# Patient Record
Sex: Male | Born: 1960
Health system: Southern US, Community
[De-identification: ages and names within clinical notes are randomized; demographics above are authoritative.]

## PROBLEM LIST (undated history)

## (undated) DIAGNOSIS — I998 Other disorder of circulatory system: Secondary | ICD-10-CM

## (undated) DIAGNOSIS — S149XXA Injury of unspecified nerves of neck, initial encounter: Secondary | ICD-10-CM

## (undated) DIAGNOSIS — G459 Transient cerebral ischemic attack, unspecified: Secondary | ICD-10-CM

## (undated) DIAGNOSIS — N529 Male erectile dysfunction, unspecified: Secondary | ICD-10-CM

## (undated) DIAGNOSIS — G589 Mononeuropathy, unspecified: Secondary | ICD-10-CM

## (undated) DIAGNOSIS — I10 Essential (primary) hypertension: Secondary | ICD-10-CM

## (undated) DIAGNOSIS — I428 Other cardiomyopathies: Secondary | ICD-10-CM

## (undated) DIAGNOSIS — R011 Cardiac murmur, unspecified: Secondary | ICD-10-CM

## (undated) DIAGNOSIS — I4821 Permanent atrial fibrillation: Secondary | ICD-10-CM

## (undated) DIAGNOSIS — G4733 Obstructive sleep apnea (adult) (pediatric): Secondary | ICD-10-CM

## (undated) DIAGNOSIS — N183 Chronic kidney disease, stage 3 (moderate): Secondary | ICD-10-CM

## (undated) DIAGNOSIS — Z9989 Dependence on other enabling machines and devices: Secondary | ICD-10-CM

## (undated) DIAGNOSIS — I119 Hypertensive heart disease without heart failure: Secondary | ICD-10-CM

## (undated) DIAGNOSIS — Z9889 Other specified postprocedural states: Secondary | ICD-10-CM

## (undated) DIAGNOSIS — M109 Gout, unspecified: Secondary | ICD-10-CM

## (undated) DIAGNOSIS — I7409 Other arterial embolism and thrombosis of abdominal aorta: Secondary | ICD-10-CM

## (undated) HISTORY — DX: Cardiac murmur, unspecified: R01.1

## (undated) HISTORY — PX: DOPPLER ECHOCARDIOGRAPHY: SHX263

## (undated) HISTORY — DX: Other cardiomyopathies: I42.8

## (undated) HISTORY — DX: Obstructive sleep apnea (adult) (pediatric): Z99.89

## (undated) HISTORY — DX: Other specified postprocedural states: Z98.890

## (undated) HISTORY — PX: KNEE SURGERY: SHX244

## (undated) HISTORY — DX: Obstructive sleep apnea (adult) (pediatric): G47.33

## (undated) HISTORY — DX: Gout, unspecified: M10.9

## (undated) HISTORY — DX: Essential (primary) hypertension: I10

## (undated) HISTORY — DX: Transient cerebral ischemic attack, unspecified: G45.9

## (undated) HISTORY — DX: Hypertensive heart disease without heart failure: I11.9

---

## 2000-03-03 ENCOUNTER — Encounter: Payer: Self-pay | Admitting: Emergency Medicine

## 2000-03-03 ENCOUNTER — Emergency Department (HOSPITAL_COMMUNITY): Admission: EM | Admit: 2000-03-03 | Discharge: 2000-03-03 | Payer: Self-pay | Admitting: Emergency Medicine

## 2001-01-20 ENCOUNTER — Emergency Department (HOSPITAL_COMMUNITY): Admission: EM | Admit: 2001-01-20 | Discharge: 2001-01-20 | Payer: Self-pay | Admitting: *Deleted

## 2001-04-04 ENCOUNTER — Emergency Department (HOSPITAL_COMMUNITY): Admission: EM | Admit: 2001-04-04 | Discharge: 2001-04-04 | Payer: Self-pay | Admitting: Emergency Medicine

## 2001-04-04 ENCOUNTER — Encounter: Payer: Self-pay | Admitting: Emergency Medicine

## 2001-04-08 ENCOUNTER — Emergency Department (HOSPITAL_COMMUNITY): Admission: EM | Admit: 2001-04-08 | Discharge: 2001-04-08 | Payer: Self-pay | Admitting: Emergency Medicine

## 2002-10-03 ENCOUNTER — Emergency Department (HOSPITAL_COMMUNITY): Admission: EM | Admit: 2002-10-03 | Discharge: 2002-10-03 | Payer: Self-pay | Admitting: Emergency Medicine

## 2003-12-28 DIAGNOSIS — G459 Transient cerebral ischemic attack, unspecified: Secondary | ICD-10-CM

## 2003-12-28 HISTORY — DX: Transient cerebral ischemic attack, unspecified: G45.9

## 2004-11-30 ENCOUNTER — Ambulatory Visit (HOSPITAL_COMMUNITY): Admission: RE | Admit: 2004-11-30 | Discharge: 2004-11-30 | Payer: Self-pay | Admitting: Neurosurgery

## 2004-12-03 ENCOUNTER — Ambulatory Visit (HOSPITAL_COMMUNITY): Admission: RE | Admit: 2004-12-03 | Discharge: 2004-12-03 | Payer: Self-pay | Admitting: *Deleted

## 2005-12-14 ENCOUNTER — Emergency Department (HOSPITAL_COMMUNITY): Admission: EM | Admit: 2005-12-14 | Discharge: 2005-12-14 | Payer: Self-pay | Admitting: Family Medicine

## 2008-04-15 ENCOUNTER — Emergency Department (HOSPITAL_COMMUNITY): Admission: EM | Admit: 2008-04-15 | Discharge: 2008-04-15 | Payer: Self-pay | Admitting: Emergency Medicine

## 2008-10-27 DIAGNOSIS — Z9889 Other specified postprocedural states: Secondary | ICD-10-CM

## 2008-10-27 HISTORY — DX: Other specified postprocedural states: Z98.890

## 2008-10-27 HISTORY — PX: OTHER SURGICAL HISTORY: SHX169

## 2008-10-27 HISTORY — PX: CARDIAC CATHETERIZATION: SHX172

## 2008-11-07 ENCOUNTER — Ambulatory Visit (HOSPITAL_COMMUNITY): Admission: RE | Admit: 2008-11-07 | Discharge: 2008-11-07 | Payer: Self-pay | Admitting: Cardiology

## 2008-12-05 ENCOUNTER — Encounter: Admission: RE | Admit: 2008-12-05 | Discharge: 2008-12-05 | Payer: Self-pay | Admitting: Family Medicine

## 2011-01-17 ENCOUNTER — Encounter: Payer: Self-pay | Admitting: Family Medicine

## 2011-05-11 NOTE — Cardiovascular Report (Signed)
[name], [name]:  [id number]   MEDICAL RECORD NO.:  [id number]          PATIENT TYPE:  OIB   LOCATION:  2899                         FACILITY:  [facility]   PHYSICIAN:  Eden Lathe. Einar Gip, MD       DATE OF BIRTH:  [birth year]   DATE OF PROCEDURE:  11/07/2008  DATE OF DISCHARGE:  11/07/2008                            CARDIAC CATHETERIZATION   PROCEDURES PERFORMED:  1. Left ventriculography.  2. Selective right and left coronary arteriography.  3. Ascending aortogram.  4. Abdominal aortogram and selective left arteriography.  5. Right heart catheterization and calculation of cardiac output and      cardiac index both by Fick and by thermodilution.   INDICATIONS:  Isaiah [name] is a 89-year gentleman who has known  atrial fibrillation, was initially seen in 2005, with the TIA, was  usually found to be in atrial fibrillation during a preop evaluation for  a cervical spinal surgery.  At that time, he was advised to be on  Coumadin; however, the patient has not been on Coumadin, was recently 2  weeks ago had an episode of TIA with right-sided weakness.  Because of  atrial fibrillation and TIA-like symptoms, he is referred for cardiac  evaluation.  Outpatient transthoracic echocardiogram had shown decreased  ejection fraction of 35-45% RV dilatation, presence of a ventricular  septal defect, and presence of possibility of bicuspid aortic valve.  Given LV systolic dysfunction and presence of VSD, he is now brought to  the catheterization lab to evaluate his coronary anatomy and also to  evaluate the significance of ventricular septal defect and to quantify  the shunting.  His right ventricle was also mild-to-moderately dilated  and mildly hypokinetic with a transthoracic echocardiogram.  Abdominal  aortogram and renal arteriography was performed to evaluate for renal  atherosclerosis and renal vascular hypertension given uncontrolled  hypertension.   HEMODYNAMIC  DATA:  Right heart catheterization.   RA pressure was 8/7 with mean 6 mmHg.  Saturation 71%.   RV pressure [id number], end-diastolic pressure 3 mmHg.  Saturation 67%.   PA pressure 33/17 with a mean of 22 mmHg.  Saturation 65%.   Pulmonary capillary wedge 9/11 with a mean of 8 mmHg.  Aortic saturation  was 94%.   Left ventricular saturation was 94%.   Cardiac output by Fick was 6.3 with a cardiac index of 2.76 and by  thermodilution, it was 5.34 with an index of 2.34, which was within  normal limits.   QT/QS was normal at 1.0.  This suggests no significant shunting.   LEFT HEART CATHETERIZATION HEMODYNAMIC DATA:  The left ventricular  pressure was [id number] with end-diastolic pressure of 8 mmHg.  Aortic  pressure was 139/100 with a mean of 118 mmHg.  There is no pressure  gradient across the aortic valve.   ANGIOGRAPHIC DATA:  Left ventricle:  Left ventricular systolic function  was preserved with an ejection fraction of 55%.  There is borderline  global hypokinesis.  There was no significant mitral regurgitation.   Ascending aortogram:  Ascending aortogram revealed presence of the  aortic valve  cusps.  The right coronary artery had an anterior origin.  There was no evidence of ascending aortic aneurysm or aortic  regurgitation.  No evidence of bicuspid aortic valve.   Right coronary artery:  Right coronary artery is anterior origin.  It is  a very large caliber vessel and gives origin to a very large PLA branch,  which supplies pretty much large part of the posterolateral wall.  The  proximal segment of his right coronary artery has a smooth 30-40%  stenosis, but is otherwise smooth and normal.   Left main coronary artery:  Left main coronary artery is unusually very  large and very long.  It trifurcates into circumflex, ramus  intermediate, and LAD.   Circumflex coronary artery:  The circumflex coronary artery is a  moderate-caliber vessel, which supplies small area.  It is  smooth and  normal.   Ramus intermediate:  Ramus intermediate is small-to-moderate caliber  vessel, which is smooth and normal.   LAD:  LAD is a moderate-caliber vessel giving origin to 3 diagonals and  several small diagonals.  LAD ends after wrapping around the apex.  The  mid-to-distal segment of the LAD has intramyocardial bridging, which is  very mild.  No significant disease was noted in the LAD.   Abdominal aorta.  Abdominal aorta is tortuous.  The renal arteries are  widely patent.   Selective left renal arteriography also confirmed widely patent renal  artery.  There was no evidence of renal artery stenosis.   IMPRESSION:  1. No significant coronary artery disease by cardiac catheterization.      Atrial fibrillation is probably secondary to hypertension with      hypertensive heart disease.  2. The ventricular septal defect appears to be very small probably      muscular in etiology.  It could not be seen by angiography and the      right heart saturation did not reveal any significant shunting      across the interventricular septum.  3. Mild pulmonary hypertension.   RECOMMENDATIONS:  For atrial fibrillation, the patient needs to be on  Coumadin given recent TIA.  Protime and INR will be checked this Monday  and Tuesday.  His serum creatinine needs be followed through.  A total  of 120 mL of contrast was utilized for diagnostic angio.   TECHNIQUE OF PROCEDURE:  Under usual sterile precautions, using a 7-  [place] right femoral venous and a 6-French right femoral venous access,  right and left heart catheterization was performed.  Using a balloon-tip  Swan-Ganz catheter, which was advanced through the venous sheath into  the pulmonary catheter wedge position without any complications.  The  right-sided hemodynamics were carefully analyzed, and cardiac outputs  were calculated both by thermodilution and by Fick.  Then, the catheter  was pulled out of the body.   Using  a multipurpose catheter, IVC, SVC saturations were also obtained.   Left heart catheterization was performed using multipurpose B2 catheter,  which was advanced over a J-wire.  Left ventriculography was performed  both in RAO and LAO projections.  Catheter was pulled into the ascending  aorta.  Left main coronary artery was selectively engaged, angiography  was performed, and ascending aortogram was performed in the LAO  projection, and abdominal aortogram and left selective left renal  arteriography was performed in the AP projection.  Catheter was pulled  out of the body, and a 6-French AL-1 catheter was utilized to engage the  aberrant  origin of the right coronary artery from the anterior wall of  the aorta, and angiography was performed.  Catheter was then pulled out  of the body in the usual fashion.  The patient tolerated the procedure  well.  No immediate complications were noted.      Eden Lathe. Einar Gip, MD  Electronically Signed     JRG/MEDQ  D:  11/07/2008  T:  11/08/2008  Job:  [id number]   cc:   Santiago Glad L. Darron Doom, M.D.

## 2011-09-29 LAB — POCT I-STAT 3, ART BLOOD GAS (G3+)
O2 Saturation: 93
O2 Saturation: 94
TCO2: 26
pCO2 arterial: 39.5
pH, Arterial: 7.407
pO2, Arterial: 72 — ABNORMAL LOW

## 2011-09-29 LAB — POCT I-STAT 3, VENOUS BLOOD GAS (G3P V)
Acid-Base Excess: 1
Acid-Base Excess: 2
Acid-base deficit: 4 — ABNORMAL HIGH
Bicarbonate: 26 — ABNORMAL HIGH
O2 Saturation: 57
O2 Saturation: 67
O2 Saturation: 71
O2 Saturation: 71
TCO2: 24
TCO2: 27
TCO2: 28
TCO2: 30
pCO2, Ven: 47.7
pH, Ven: 7.299
pH, Ven: 7.354 — ABNORMAL HIGH
pO2, Ven: 31

## 2011-09-29 LAB — BASIC METABOLIC PANEL
BUN: 21
CO2: 27
Calcium: 11.1 — ABNORMAL HIGH
Chloride: 105
Creatinine, Ser: 1.87 — ABNORMAL HIGH
GFR calc Af Amer: 47 — ABNORMAL LOW
GFR calc non Af Amer: 39 — ABNORMAL LOW
Glucose, Bld: 87
Potassium: 3.8
Sodium: 140

## 2012-03-02 ENCOUNTER — Other Ambulatory Visit: Payer: Self-pay | Admitting: Physician Assistant

## 2012-06-02 ENCOUNTER — Other Ambulatory Visit: Payer: Self-pay | Admitting: Physician Assistant

## 2012-09-15 ENCOUNTER — Other Ambulatory Visit: Payer: Self-pay | Admitting: Physician Assistant

## 2012-09-15 NOTE — Telephone Encounter (Signed)
Please pull paper chart.  

## 2012-09-18 NOTE — Telephone Encounter (Signed)
Chart pulled to PA pool at nurse station [phone]

## 2012-11-19 ENCOUNTER — Ambulatory Visit (INDEPENDENT_AMBULATORY_CARE_PROVIDER_SITE_OTHER): Payer: BC Managed Care – PPO | Admitting: Family Medicine

## 2012-11-19 VITALS — BP 180/126 | HR 85 | Temp 98.3°F | Resp 20 | Ht 73.5 in | Wt 241.0 lb

## 2012-11-19 DIAGNOSIS — M62830 Muscle spasm of back: Secondary | ICD-10-CM

## 2012-11-19 DIAGNOSIS — I10 Essential (primary) hypertension: Secondary | ICD-10-CM

## 2012-11-19 DIAGNOSIS — M5380 Other specified dorsopathies, site unspecified: Secondary | ICD-10-CM

## 2012-11-19 MED ORDER — CARVEDILOL 6.25 MG PO TABS: 6.2500 mg | ORAL_TABLET | Freq: Two times a day (BID) | ORAL | Status: DC

## 2012-11-19 MED ORDER — PREDNISONE 20 MG PO TABS: ORAL_TABLET | ORAL | Status: DC

## 2012-11-19 MED ORDER — CLONIDINE HCL 0.1 MG PO TABS
0.2000 mg | ORAL_TABLET | Freq: Once | ORAL | Status: AC
  Administered 2012-11-19: 0.2 mg via ORAL

## 2012-11-19 MED ORDER — LOSARTAN POTASSIUM-HCTZ 100-25 MG PO TABS: 1.0000 | ORAL_TABLET | Freq: Every day | ORAL | Status: DC

## 2012-11-19 MED ORDER — DIAZEPAM 5 MG PO TABS: 5.0000 mg | ORAL_TABLET | Freq: Two times a day (BID) | ORAL | Status: DC | PRN

## 2012-11-19 NOTE — Patient Instructions (Signed)
Make sure you are applying heat to the area for at least 15 minutes 4 times a day followed by gentle stretching of your neck and shoulders.  After this, I recommend massage down the left side of your spine with a tennis ball.  Pinched Nerve The term pinched nerve describes one type of damage or injury to a nerve or set of nerves. Pinched nerves can sometimes lead to other conditions. These include peripheral neuropathy, carpal tunnel syndrome, and tennis elbow. The extent of such injuries may vary from minor, temporary damage to a more permanent condition. Early diagnosis is important to prevent further damage or complications. Pinched nerve is a common cause of on-the-job injury. CAUSES  The injury may result from:  Compression.  Constriction.  Stretching. SYMPTOMS  Symptoms include:  Numbness.  "Pins and needles" or burning sensations.  Pain radiating outward from the injured area.  One of the most common examples of a single compressed nerve is the feeling of having a foot or hand "fall asleep." TREATMENT  The most often recommended treatment for pinched nerve is rest for the affected area. Corticosteroids help alleviate pain. In some cases, surgery is recommended. Physical therapy may be recommended. Splints or collars may be used. With treatment, most people recover from pinched nerve. In some cases, the damage is irreversible. Document Released: 12/03/2002 Document Revised: 03/06/2012 Document Reviewed: 11/20/2008 [facility] Patient Information 2013 [place].  Your blood pressure if VERY high and it is important that we get it treated as soon as possible.  Please pick up your losartan-hctz - this should only be $10/month - if it is more expensive, consider switching to a different pharmacy.  Instead of the bystolic, we will switch you to carvedilol which should be only $4/month.  Come back to clinic in 1 wk for recheck on your pinched nerve and your blood pressure and at that  time, we can decide if you want to switch from the carvedilol to the bystolic.

## 2012-11-19 NOTE — Progress Notes (Signed)
Subjective:    Patient ID: Isaiah [name], male    DOB: 1961-01-16, 51 y.o.   MRN: [id number] Chief Complaint  Patient presents with  . Neck Pain    middle of back neck for 2 days  . Shoulder Pain    right side 2 days    HPI  Yesterday pt developed severe left upper back pain along left spine and inside of left shoulder blade, feels much better if he leans his neck to the right.  Has never had a problem like the prior but thinks it must be a pinched nerve because he talked with other guys at his gym who have had similar sxs and looked it up on-line.  He had some aleve and a very old muscle relaxant at home (does not know what it was) that he tried yesterday w/o any relief.  Massage feels very good.    He has a long standing h/o HTN but has trouble affording his BP medication - was going to be about $140 so has been off of it for about 1-2 mos - he last checked his BP at the pharmacy about a month ago and it was 190s/100s.  He currently does not have a PCP - was being seen by Dr. Moshe Cipro who moved away but gave him a yr worth of this bp med before he left.  States he has been on sev other bp meds in the past but these seem to be the one that work the best.  Past Medical History  Diagnosis Date  . Hypertension   . Gout    Current Outpatient Prescriptions on File Prior to Visit  Medication Sig Dispense Refill  . bystolic 10 MG tablet Take 1 tablet (10 mg total) by mouth once daily.  60 tablet  0  . indomethacin (INDOCIN) 50 MG capsule take 1 capsule by mouth three times a day if needed for gout  30 capsule  0  . losartan-hydrochlorothiazide (HYZAAR) 100-25 MG per tablet Take 1 tablet by mouth daily.  30 tablet  0   No current facility-administered medications on file prior to visit.   Review of Systems  HENT: Positive for neck pain and neck stiffness.   Musculoskeletal: Positive for back pain, joint swelling and arthralgias. Negative for myalgias and gait problem.  Neurological: Negative  for weakness and numbness.  Hematological: Negative for adenopathy.      BP 183/141  Pulse 85  Temp 98.3 F (36.8 C) (Oral)  Resp 20  Ht 6' 1.5" (1.867 m)  Wt 241 lb (109.317 kg)  BMI 31.37 kg/m2  SpO2 100% Objective:   Physical Exam  Constitutional: He is oriented to person, place, and time. He appears well-developed and well-nourished. No distress.  HENT:  Head: Normocephalic and atraumatic.  Eyes: Conjunctivae are normal. Pupils are equal, round, and reactive to light. No scleral icterus.  Neck: Neck supple. Muscular tenderness present. No spinous process tenderness present. Decreased range of motion present. No erythema present. No thyromegaly present.  Cardiovascular: Normal rate, regular rhythm, normal heart sounds and intact distal pulses.   Pulmonary/Chest: Effort normal and breath sounds normal. No respiratory distress.  Musculoskeletal: He exhibits tenderness. He exhibits no edema.       Right shoulder: He exhibits pain and spasm. He exhibits normal range of motion, no bony tenderness, no crepitus, no deformity and normal strength.       Cervical back: He exhibits decreased range of motion, tenderness and spasm. He exhibits no bony tenderness.  Thoracic back: He exhibits normal range of motion, no bony tenderness, no swelling and no deformity.       Lumbar back: He exhibits normal range of motion, no bony tenderness, no edema and no deformity.  Lymphadenopathy:    He has no cervical adenopathy.  Neurological: He is alert and oriented to person, place, and time. He has normal strength and normal reflexes. He displays no atrophy. No sensory deficit. He exhibits normal muscle tone. Coordination and gait normal.  Skin: Skin is warm and dry. No rash noted. He is not diaphoretic. No erythema.  Psychiatric: He has a normal mood and affect. His behavior is normal.          Assessment & Plan:   1. Hypertension  cloNIDine (CATAPRES) tablet 0.2 mg,  losartan-hydrochlorothiazide (HYZAAR) 100-25 MG per tablet, carvedilol (COREG) 6.25 MG tablet  2. Muscle spasm of back - heat, massage, stretching.   predniSONE (DELTASONE) 20 MG tablet, diazepam (VALIUM) 5 MG tablet  RTC 1 wk  for bp recheck - decide whether he wants to switch back to expensive bystolic or increase carvedilol (may cause prob w/ erectile dysfunction).  Also, the hctz may induce gout flares so consider stopping - consider adding in ccb - amlodipine?   Will also recheck back - consider trigger point injection vs PT if still having pain.

## 2012-11-23 ENCOUNTER — Other Ambulatory Visit: Payer: Self-pay | Admitting: Physician Assistant

## 2012-11-27 ENCOUNTER — Ambulatory Visit (INDEPENDENT_AMBULATORY_CARE_PROVIDER_SITE_OTHER): Payer: BC Managed Care – PPO | Admitting: Emergency Medicine

## 2012-11-27 ENCOUNTER — Other Ambulatory Visit: Payer: Self-pay | Admitting: *Deleted

## 2012-11-27 ENCOUNTER — Ambulatory Visit: Payer: BC Managed Care – PPO

## 2012-11-27 VITALS — BP 133/110 | HR 83 | Temp 97.6°F | Resp 18 | Wt 238.0 lb

## 2012-11-27 DIAGNOSIS — R52 Pain, unspecified: Secondary | ICD-10-CM

## 2012-11-27 DIAGNOSIS — IMO0002 Reserved for concepts with insufficient information to code with codable children: Secondary | ICD-10-CM

## 2012-11-27 DIAGNOSIS — M25519 Pain in unspecified shoulder: Secondary | ICD-10-CM

## 2012-11-27 DIAGNOSIS — M5410 Radiculopathy, site unspecified: Secondary | ICD-10-CM

## 2012-11-27 DIAGNOSIS — M5090 Cervical disc disorder, unspecified, unspecified cervical region: Secondary | ICD-10-CM

## 2012-11-27 MED ORDER — GABAPENTIN 300 MG PO CAPS: ORAL_CAPSULE | ORAL | Status: DC

## 2012-11-27 MED ORDER — PREDNISONE 20 MG PO TABS: ORAL_TABLET | ORAL | Status: DC

## 2012-11-27 NOTE — Progress Notes (Signed)
  Subjective:    Patient ID: Isaiah [name], male    DOB: 12-16-1961, 51 y.o.   MRN: [id number]  HPI Pain in left shoulder feels like a "pinched nerve" x 3 weeks; more painful when leans neck to left. Seen in our office 11/19/12.  Given predinsone and valium. Numbness in left index finger, pain extends to neck and left shoulder blade.  Mixes dyes at work, very physical; works out at gym  Review of Systems     Objective:   Physical Exam there is tenderness along the left side of the neck along the left medial scapula. There is an absent deep tendon reflex in the left biceps and left brachioradialis. Triceps reflexes 2+. There is significant pain with extension of the neck [facility] reading (PRIMARY) by  Dr.Zaakirah Kistner C5-C6 degenerative disc disease is noted . C7 not seen on these        Assessment & Plan:  Patient has signs and symptoms of a radiculopathy involving C5 and C6. We'll check baseline films and decide on treatment plan after that. Patient does not tolerate pain medications. We'll put on a taper dose of prednisone along with Neurontin and have him see Dr. Jimmye Norman for evaluation.

## 2012-12-06 ENCOUNTER — Encounter (HOSPITAL_COMMUNITY): Payer: Self-pay

## 2012-12-06 ENCOUNTER — Emergency Department (HOSPITAL_COMMUNITY)
Admission: EM | Admit: 2012-12-06 | Discharge: 2012-12-06 | Disposition: A | Discharge status: Home / Self Care | Payer: BC Managed Care – PPO | Attending: Emergency Medicine | Admitting: Emergency Medicine

## 2012-12-06 DIAGNOSIS — M62830 Muscle spasm of back: Secondary | ICD-10-CM

## 2012-12-06 DIAGNOSIS — M549 Dorsalgia, unspecified: Secondary | ICD-10-CM | POA: Insufficient documentation

## 2012-12-06 DIAGNOSIS — Z7901 Long term (current) use of anticoagulants: Secondary | ICD-10-CM | POA: Insufficient documentation

## 2012-12-06 DIAGNOSIS — I10 Essential (primary) hypertension: Secondary | ICD-10-CM | POA: Insufficient documentation

## 2012-12-06 DIAGNOSIS — M109 Gout, unspecified: Secondary | ICD-10-CM | POA: Insufficient documentation

## 2012-12-06 DIAGNOSIS — M5380 Other specified dorsopathies, site unspecified: Secondary | ICD-10-CM | POA: Insufficient documentation

## 2012-12-06 DIAGNOSIS — Z79899 Other long term (current) drug therapy: Secondary | ICD-10-CM | POA: Insufficient documentation

## 2012-12-06 MED ORDER — METHOCARBAMOL 500 MG PO TABS
1000.0000 mg | ORAL_TABLET | Freq: Once | ORAL | Status: AC
  Administered 2012-12-06: 1000 mg via ORAL
  Filled 2012-12-06: qty 2

## 2012-12-06 MED ORDER — LOSARTAN POTASSIUM 50 MG PO TABS
100.0000 mg | ORAL_TABLET | Freq: Once | ORAL | Status: AC
  Administered 2012-12-06: 100 mg via ORAL
  Filled 2012-12-06: qty 2

## 2012-12-06 MED ORDER — KETOROLAC TROMETHAMINE 60 MG/2ML IM SOLN
60.0000 mg | Freq: Once | INTRAMUSCULAR | Status: AC
  Administered 2012-12-06: 60 mg via INTRAMUSCULAR
  Filled 2012-12-06: qty 2

## 2012-12-06 MED ORDER — NAPROXEN 500 MG PO TABS: 500.0000 mg | ORAL_TABLET | Freq: Two times a day (BID) | ORAL | Status: DC

## 2012-12-06 MED ORDER — METHOCARBAMOL 750 MG PO TABS: 750.0000 mg | ORAL_TABLET | Freq: Two times a day (BID) | ORAL | Status: DC

## 2012-12-06 NOTE — ED Notes (Signed)
Pt. States "I have a pinched nerve. It starts in my neck and goes down my left arm". Pt. States he went to his PCP, given prednisone and muscle relaxer. Also been seeing a chiropractor x1week.

## 2012-12-06 NOTE — ED Provider Notes (Addendum)
History     CSN: [id number]  Arrival date & time 12/06/12  [id number]   First MD Initiated Contact with Patient 12/06/12 [phone]      Chief Complaint  Patient presents with  . Numbness    (Consider location/radiation/quality/duration/timing/severity/associated sxs/prior treatment) Patient is a 51 y.o. male presenting with back pain. The history is provided by the patient.  Back Pain  This is a chronic problem. The current episode started more than 1 week ago. The problem occurs constantly. The problem has not changed since onset.The pain is associated with no known injury. Pain location: neck and along trapezius muscles on the left. The quality of the pain is described as stabbing and shooting. Radiates to: left arm tingling. The pain is severe. The symptoms are aggravated by certain positions. The pain is the same all the time. Associated symptoms include tingling. Pertinent negatives include no chest pain, no fever, no numbness, no weight loss, no headaches, no abdominal pain, no abdominal swelling, no bowel incontinence, no perianal numbness, no bladder incontinence, no dysuria, no pelvic pain, no leg pain, no paresthesias, no paresis and no weakness. He has tried analgesics and muscle relaxants for the symptoms. The treatment provided mild relief. Risk factors: has been seeing a chiropractor and manipulation made this worse.  PERC negative and [place] 0 no CP, no DOE no SOB works out.    Past Medical History  Diagnosis Date  . Hypertension   . Gout     Past Surgical History  Procedure Date  . Knee surgery     Family History  Problem Relation Age of Onset  . Hypertension Mother   . Hypertension Father     History  Substance Use Topics  . Smoking status: Never Smoker   . Smokeless tobacco: Not on file  . Alcohol Use: No      Review of Systems  Constitutional: Negative for fever and weight loss.  HENT: Negative for neck stiffness.   Respiratory: Negative for chest tightness,  shortness of breath and wheezing.   Cardiovascular: Negative for chest pain, palpitations and leg swelling.  Gastrointestinal: Negative for abdominal pain and bowel incontinence.  Genitourinary: Negative for bladder incontinence, dysuria and pelvic pain.  Musculoskeletal: Positive for back pain.  Neurological: Positive for tingling. Negative for weakness, numbness, headaches and paresthesias.  All other systems reviewed and are negative.    Allergies  Codeine  Home Medications   Current Outpatient Rx  Name  Route  Sig  Dispense  Refill  . CARVEDILOL 6.25 MG PO TABS   Oral   Take 1 tablet (6.25 mg total) by mouth 2 (two) times daily with a meal.   60 tablet   0   . DIAZEPAM 5 MG PO TABS   Oral   Take 1 tablet (5 mg total) by mouth every 12 (twelve) hours as needed (muscle spasm).   20 tablet   0   . GABAPENTIN 300 MG PO CAPS      Neurontin 300 mg one in the morning and one at bedtime   60 capsule   3   . INDOMETHACIN 50 MG PO CAPS      take 1 capsule by mouth three times a day if needed for gout   30 capsule   0   . LOSARTAN POTASSIUM-HCTZ 100-25 MG PO TABS   Oral   Take 1 tablet by mouth daily.   30 tablet   0   . PREDNISONE 20 MG PO TABS  Take 3 a day for 3 days 2 a day for 3 days one a day for 3 days   18 tablet   0     BP 182/131  Pulse 85  Resp 18  SpO2 100%  Physical Exam  Constitutional: He is oriented to person, place, and time. He appears well-developed and well-nourished. No distress.  HENT:  Head: Normocephalic and atraumatic.  Mouth/Throat: Oropharynx is clear and moist.  Eyes: Conjunctivae normal are normal. Pupils are equal, round, and reactive to light.  Neck: Normal range of motion. Neck supple. No JVD present. No tracheal deviation present.  Cardiovascular: Normal rate, regular rhythm and intact distal pulses.   Pulmonary/Chest: Effort normal and breath sounds normal. He has no wheezes. He has no rales.  Abdominal: Soft. Bowel  sounds are normal. There is no tenderness. There is no rebound and no guarding.  Neurological: He is alert and oriented to person, place, and time. He has normal reflexes. He displays normal reflexes. He exhibits normal muscle tone.       5/5 strength in the LUE intact sensation intact reflexes palpable spasm in the left trapezius muscles  Skin: Skin is warm and dry.  Psychiatric: He has a normal mood and affect.    ED Course  Procedures (including critical care time)  Labs Reviewed - No data to display No results found.   No diagnosis found.    MDM  Recommend follow up with PMD for rehab medicine referral.  Will change muscle relaxant.  Patient still has steroids will add another pain med.     Patient not taking his BP meds will give a dose here and told he must restart same and must follow up.  Return for fevers chills weakness or numbness     Shacoria Latif K Shafter Jupin-Rasch, MD 12/06/12 0601  Brittney Mucha K Arslan Kier-Rasch, MD 12/06/12 [phone]

## 2012-12-06 NOTE — ED Notes (Signed)
Pt. BP high due to pt non-compliance with medicine.

## 2012-12-18 DIAGNOSIS — Z0271 Encounter for disability determination: Secondary | ICD-10-CM

## 2013-01-17 ENCOUNTER — Other Ambulatory Visit: Payer: Self-pay | Admitting: Physician Assistant

## 2013-01-23 ENCOUNTER — Encounter (HOSPITAL_COMMUNITY): Payer: Self-pay | Admitting: *Deleted

## 2013-01-23 ENCOUNTER — Emergency Department (HOSPITAL_COMMUNITY): Payer: BC Managed Care – PPO | Admitting: Anesthesiology

## 2013-01-23 ENCOUNTER — Encounter (HOSPITAL_COMMUNITY): Payer: Self-pay | Admitting: Anesthesiology

## 2013-01-23 ENCOUNTER — Inpatient Hospital Stay (HOSPITAL_COMMUNITY)
Admission: EM | Admit: 2013-01-23 | Discharge: 2013-01-26 | DRG: 478 | Disposition: A | Discharge status: Home / Self Care | Payer: BC Managed Care – PPO | Attending: Surgery | Admitting: Surgery

## 2013-01-23 ENCOUNTER — Encounter (HOSPITAL_COMMUNITY)
Admission: EM | Disposition: A | Discharge status: Home / Self Care | Payer: Self-pay | Source: Home / Self Care | Attending: Surgery

## 2013-01-23 DIAGNOSIS — I4821 Permanent atrial fibrillation: Secondary | ICD-10-CM | POA: Diagnosis present

## 2013-01-23 DIAGNOSIS — M109 Gout, unspecified: Secondary | ICD-10-CM | POA: Diagnosis present

## 2013-01-23 DIAGNOSIS — I998 Other disorder of circulatory system: Secondary | ICD-10-CM

## 2013-01-23 DIAGNOSIS — I129 Hypertensive chronic kidney disease with stage 1 through stage 4 chronic kidney disease, or unspecified chronic kidney disease: Secondary | ICD-10-CM | POA: Diagnosis present

## 2013-01-23 DIAGNOSIS — I7409 Other arterial embolism and thrombosis of abdominal aorta: Principal | ICD-10-CM

## 2013-01-23 DIAGNOSIS — I10 Essential (primary) hypertension: Secondary | ICD-10-CM

## 2013-01-23 DIAGNOSIS — N183 Chronic kidney disease, stage 3 unspecified: Secondary | ICD-10-CM | POA: Diagnosis present

## 2013-01-23 DIAGNOSIS — M79609 Pain in unspecified limb: Secondary | ICD-10-CM

## 2013-01-23 DIAGNOSIS — Z7982 Long term (current) use of aspirin: Secondary | ICD-10-CM

## 2013-01-23 DIAGNOSIS — Z7901 Long term (current) use of anticoagulants: Secondary | ICD-10-CM

## 2013-01-23 DIAGNOSIS — N529 Male erectile dysfunction, unspecified: Secondary | ICD-10-CM | POA: Diagnosis present

## 2013-01-23 DIAGNOSIS — I743 Embolism and thrombosis of arteries of the lower extremities: Secondary | ICD-10-CM

## 2013-01-23 DIAGNOSIS — R404 Transient alteration of awareness: Secondary | ICD-10-CM | POA: Diagnosis not present

## 2013-01-23 DIAGNOSIS — Z79899 Other long term (current) drug therapy: Secondary | ICD-10-CM

## 2013-01-23 DIAGNOSIS — I2510 Atherosclerotic heart disease of native coronary artery without angina pectoris: Secondary | ICD-10-CM | POA: Diagnosis present

## 2013-01-23 DIAGNOSIS — I4891 Unspecified atrial fibrillation: Secondary | ICD-10-CM | POA: Diagnosis present

## 2013-01-23 DIAGNOSIS — Z8673 Personal history of transient ischemic attack (TIA), and cerebral infarction without residual deficits: Secondary | ICD-10-CM

## 2013-01-23 HISTORY — DX: Mononeuropathy, unspecified: G58.9

## 2013-01-23 HISTORY — DX: Permanent atrial fibrillation: I48.21

## 2013-01-23 HISTORY — PX: EMBOLECTOMY: SHX44

## 2013-01-23 HISTORY — DX: Injury of unspecified nerves of neck, initial encounter: S14.9XXA

## 2013-01-23 HISTORY — DX: Other disorder of circulatory system: I99.8

## 2013-01-23 HISTORY — DX: Chronic kidney disease, stage 3 (moderate): N18.3

## 2013-01-23 HISTORY — DX: Other arterial embolism and thrombosis of abdominal aorta: I74.09

## 2013-01-23 HISTORY — DX: Male erectile dysfunction, unspecified: N52.9

## 2013-01-23 LAB — CBC WITH DIFFERENTIAL/PLATELET
Basophils Absolute: 0 10*3/uL (ref 0.0–0.1)
Eosinophils Absolute: 0 10*3/uL (ref 0.0–0.7)
Eosinophils Relative: 0 % (ref 0–5)
Lymphocytes Relative: 15 % (ref 12–46)
Lymphs Abs: 1.9 10*3/uL (ref 0.7–4.0)
MCV: 97.3 fL (ref 78.0–100.0)
Neutrophils Relative %: 80 % — ABNORMAL HIGH (ref 43–77)
Platelets: 132 10*3/uL — ABNORMAL LOW (ref 150–400)
RBC: 4.87 MIL/uL (ref 4.22–5.81)
RDW: 12.8 % (ref 11.5–15.5)
WBC: 12.9 10*3/uL — ABNORMAL HIGH (ref 4.0–10.5)

## 2013-01-23 LAB — PREPARE RBC (CROSSMATCH)

## 2013-01-23 LAB — POCT I-STAT, CHEM 8
BUN: 27 mg/dL — ABNORMAL HIGH (ref 6–23)
Calcium, Ion: 1.38 mmol/L — ABNORMAL HIGH (ref 1.12–1.23)
Hemoglobin: 17.3 g/dL — ABNORMAL HIGH (ref 13.0–17.0)
Sodium: 137 mEq/L (ref 135–145)
TCO2: 29 mmol/L (ref 0–100)

## 2013-01-23 LAB — BASIC METABOLIC PANEL
BUN: 27 mg/dL — ABNORMAL HIGH (ref 6–23)
Calcium: 11.4 mg/dL — ABNORMAL HIGH (ref 8.4–10.5)
Creatinine, Ser: 2.21 mg/dL — ABNORMAL HIGH (ref 0.50–1.35)
GFR calc Af Amer: 38 mL/min — ABNORMAL LOW (ref >90)
GFR calc non Af Amer: 33 mL/min — ABNORMAL LOW (ref >90)

## 2013-01-23 SURGERY — ABDOMINAL AORTAGRAM
Anesthesia: General | Site: Leg Lower | Wound class: Clean

## 2013-01-23 MED ORDER — CEFAZOLIN SODIUM-DEXTROSE 2-3 GM-% IV SOLR
INTRAVENOUS | Status: DC | PRN
  Administered 2013-01-23: 2 g via INTRAVENOUS

## 2013-01-23 MED ORDER — SUCCINYLCHOLINE CHLORIDE 20 MG/ML IJ SOLN
INTRAMUSCULAR | Status: DC | PRN
  Administered 2013-01-23: 100 mg via INTRAVENOUS

## 2013-01-23 MED ORDER — HEPARIN SODIUM (PORCINE) 1000 UNIT/ML IJ SOLN
INTRAMUSCULAR | Status: DC | PRN
  Administered 2013-01-23: 4000 [IU] via INTRAVENOUS
  Administered 2013-01-23: 3000 [IU] via INTRAVENOUS

## 2013-01-23 MED ORDER — HEPARIN (PORCINE) IN NACL 100-0.45 UNIT/ML-% IJ SOLN
INTRAMUSCULAR | Status: DC | PRN
  Administered 2013-01-23: 16 [IU]/h via INTRAVENOUS

## 2013-01-23 MED ORDER — MIDAZOLAM HCL 5 MG/5ML IJ SOLN
INTRAMUSCULAR | Status: DC | PRN
  Administered 2013-01-23: 2 mg via INTRAVENOUS

## 2013-01-23 MED ORDER — 0.9 % SODIUM CHLORIDE (POUR BTL) OPTIME
TOPICAL | Status: DC | PRN
  Administered 2013-01-23: 1000 mL

## 2013-01-23 MED ORDER — SODIUM CHLORIDE 0.9 % IV BOLUS (SEPSIS)
1000.0000 mL | Freq: Once | INTRAVENOUS | Status: AC
  Administered 2013-01-23: 1000 mL via INTRAVENOUS

## 2013-01-23 MED ORDER — LACTATED RINGERS IV SOLN
INTRAVENOUS | Status: DC | PRN
  Administered 2013-01-23: 21:00:00 via INTRAVENOUS

## 2013-01-23 MED ORDER — LACTATED RINGERS IV SOLN
INTRAVENOUS | Status: DC | PRN
  Administered 2013-01-23 (×3): via INTRAVENOUS

## 2013-01-23 MED ORDER — SODIUM CHLORIDE 0.9 % IV SOLN
10.0000 mg | INTRAVENOUS | Status: DC | PRN
  Administered 2013-01-23: 25 ug/min via INTRAVENOUS

## 2013-01-23 MED ORDER — SODIUM CHLORIDE 0.9 % IJ SOLN
INTRAVENOUS | Status: DC | PRN
  Administered 2013-01-23 – 2013-01-24 (×2): via INTRAMUSCULAR

## 2013-01-23 MED ORDER — HYDROMORPHONE HCL PF 1 MG/ML IJ SOLN
1.0000 mg | Freq: Once | INTRAMUSCULAR | Status: AC
  Administered 2013-01-23: 1 mg via INTRAVENOUS
  Filled 2013-01-23: qty 1

## 2013-01-23 MED ORDER — FENTANYL CITRATE 0.05 MG/ML IJ SOLN
INTRAMUSCULAR | Status: DC | PRN
  Administered 2013-01-23: 50 ug via INTRAVENOUS
  Administered 2013-01-23 (×3): 100 ug via INTRAVENOUS
  Administered 2013-01-23: 50 ug via INTRAVENOUS

## 2013-01-23 MED ORDER — ROCURONIUM BROMIDE 100 MG/10ML IV SOLN
INTRAVENOUS | Status: DC | PRN
  Administered 2013-01-23: 50 mg via INTRAVENOUS
  Administered 2013-01-23: 20 mg via INTRAVENOUS

## 2013-01-23 MED ORDER — PROPOFOL 10 MG/ML IV BOLUS
INTRAVENOUS | Status: DC | PRN
  Administered 2013-01-23: 200 mg via INTRAVENOUS

## 2013-01-23 MED ORDER — SODIUM CHLORIDE 0.9 % IR SOLN
Status: DC | PRN
  Administered 2013-01-23: 23:00:00

## 2013-01-23 MED ORDER — LIDOCAINE HCL (CARDIAC) 20 MG/ML IV SOLN
INTRAVENOUS | Status: DC | PRN
  Administered 2013-01-23: 70 mg via INTRAVENOUS

## 2013-01-23 MED ORDER — HEPARIN (PORCINE) IN NACL 100-0.45 UNIT/ML-% IJ SOLN
1600.0000 [IU]/h | INTRAMUSCULAR | Status: DC
  Administered 2013-01-23: 1600 [IU]/h via INTRAVENOUS
  Filled 2013-01-23 (×2): qty 250

## 2013-01-23 MED ORDER — DEXTROSE 5 % IV SOLN
1.5000 g | INTRAVENOUS | Status: DC
  Filled 2013-01-23: qty 1.5

## 2013-01-23 MED ORDER — HEPARIN BOLUS VIA INFUSION
5000.0000 [IU] | Freq: Once | INTRAVENOUS | Status: AC
  Administered 2013-01-23: 5000 [IU] via INTRAVENOUS

## 2013-01-23 SURGICAL SUPPLY — 71 items
BAG BANDED W/RUBBER/TAPE 36X54 (MISCELLANEOUS) ×12 IMPLANT
BAG SNAP BAND KOVER 36X36 (MISCELLANEOUS) ×4 IMPLANT
CANISTER SUCTION 2500CC (MISCELLANEOUS) ×4 IMPLANT
CATH EMB 3FR 80CM (CATHETERS) ×8 IMPLANT
CATH EMB 4FR 80CM (CATHETERS) ×4 IMPLANT
CATH EMB 5FR 80CM (CATHETERS) ×4 IMPLANT
CATH OMNI FLUSH .035X70CM (CATHETERS) ×4 IMPLANT
CLIP TI MEDIUM 24 (CLIP) ×8 IMPLANT
CLIP TI WIDE RED SMALL 24 (CLIP) ×8 IMPLANT
CLOTH BEACON ORANGE TIMEOUT ST (SAFETY) ×4 IMPLANT
COVER MAYO STAND STRL (DRAPES) ×4 IMPLANT
COVER SURGICAL LIGHT HANDLE (MISCELLANEOUS) ×4 IMPLANT
DERMABOND ADHESIVE PROPEN (GAUZE/BANDAGES/DRESSINGS) ×1
DERMABOND ADVANCED (GAUZE/BANDAGES/DRESSINGS) ×2
DERMABOND ADVANCED .7 DNX12 (GAUZE/BANDAGES/DRESSINGS) ×6 IMPLANT
DERMABOND ADVANCED .7 DNX6 (GAUZE/BANDAGES/DRESSINGS) ×3 IMPLANT
DRAPE WARM FLUID 44X44 (DRAPE) ×4 IMPLANT
ELECT BLADE 4.0 EZ CLEAN MEGAD (MISCELLANEOUS) ×4
ELECT BLADE 6.5 EXT (BLADE) IMPLANT
ELECT CAUTERY BLADE 6.4 (BLADE) ×4 IMPLANT
ELECT REM PT RETURN 9FT ADLT (ELECTROSURGICAL) ×4
ELECTRODE BLDE 4.0 EZ CLN MEGD (MISCELLANEOUS) ×3 IMPLANT
ELECTRODE REM PT RTRN 9FT ADLT (ELECTROSURGICAL) ×3 IMPLANT
GLOVE BIO SURGEON STRL SZ7 (GLOVE) ×20 IMPLANT
GLOVE BIOGEL PI IND STRL 7.0 (GLOVE) ×9 IMPLANT
GLOVE BIOGEL PI IND STRL 7.5 (GLOVE) ×9 IMPLANT
GLOVE BIOGEL PI INDICATOR 7.0 (GLOVE) ×3
GLOVE BIOGEL PI INDICATOR 7.5 (GLOVE) ×3
GLOVE SURG SS PI 7.5 STRL IVOR (GLOVE) ×4 IMPLANT
GOWN PREVENTION PLUS XLARGE (GOWN DISPOSABLE) ×12 IMPLANT
GOWN PREVENTION PLUS XXLARGE (GOWN DISPOSABLE) ×4 IMPLANT
GOWN STRL NON-REIN LRG LVL3 (GOWN DISPOSABLE) ×16 IMPLANT
GOWN STRL REIN 2XL LVL4 (GOWN DISPOSABLE) IMPLANT
HEMOSTAT SNOW SURGICEL 2X4 (HEMOSTASIS) ×8 IMPLANT
HEMOSTAT SURGICEL 2X14 (HEMOSTASIS) IMPLANT
INSERT FOGARTY 61MM (MISCELLANEOUS) ×4 IMPLANT
INSERT FOGARTY SM (MISCELLANEOUS) ×8 IMPLANT
KIT BASIN OR (CUSTOM PROCEDURE TRAY) ×4 IMPLANT
KIT ROOM TURNOVER OR (KITS) ×4 IMPLANT
LOOP VESSEL MINI RED (MISCELLANEOUS) ×4 IMPLANT
MARKER GRAFT CORONARY BYPASS (MISCELLANEOUS) IMPLANT
NEEDLE PERC 18GX7CM (NEEDLE) ×4 IMPLANT
NS IRRIG 1000ML POUR BTL (IV SOLUTION) ×8 IMPLANT
PACK AORTA (CUSTOM PROCEDURE TRAY) ×4 IMPLANT
PAD ARMBOARD 7.5X6 YLW CONV (MISCELLANEOUS) ×8 IMPLANT
PENCIL BUTTON HOLSTER BLD 10FT (ELECTRODE) ×4 IMPLANT
SHEATH AVANTI 11CM 5FR (MISCELLANEOUS) ×4 IMPLANT
SPECIMEN JAR MEDIUM (MISCELLANEOUS) ×4 IMPLANT
SUT ETHIBOND 5 LR DA (SUTURE) IMPLANT
SUT PDS AB 1 TP1 54 (SUTURE) ×8 IMPLANT
SUT PROLENE 3 0 SH 48 (SUTURE) ×4 IMPLANT
SUT PROLENE 5 0 C 1 24 (SUTURE) ×16 IMPLANT
SUT PROLENE 5 0 C 1 36 (SUTURE) ×4 IMPLANT
SUT SILK 2 0 TIES 17X18 (SUTURE) ×1
SUT SILK 2 0SH CR/8 30 (SUTURE) IMPLANT
SUT SILK 2-0 18XBRD TIE BLK (SUTURE) ×3 IMPLANT
SUT SILK 3 0 TIES 17X18 (SUTURE) ×1
SUT SILK 3-0 18XBRD TIE BLK (SUTURE) ×3 IMPLANT
SUT VIC AB 2-0 CT1 27 (SUTURE) ×2
SUT VIC AB 2-0 CT1 TAPERPNT 27 (SUTURE) ×6 IMPLANT
SUT VIC AB 3-0 SH 27 (SUTURE) ×2
SUT VIC AB 3-0 SH 27X BRD (SUTURE) ×6 IMPLANT
SUT VICRYL 4-0 PS2 18IN ABS (SUTURE) ×8 IMPLANT
SYR 3ML LL SCALE MARK (SYRINGE) ×4 IMPLANT
SYR MEDRAD MARK V 150ML (SYRINGE) ×4 IMPLANT
TOWEL BLUE STERILE X RAY DET (MISCELLANEOUS) ×8 IMPLANT
TOWEL OR 17X24 6PK STRL BLUE (TOWEL DISPOSABLE) ×8 IMPLANT
TOWEL OR 17X26 10 PK STRL BLUE (TOWEL DISPOSABLE) ×8 IMPLANT
TRAY FOLEY CATH 14FRSI W/METER (CATHETERS) ×4 IMPLANT
WATER STERILE IRR 1000ML POUR (IV SOLUTION) ×8 IMPLANT
WIRE BENTSON .035X145CM (WIRE) ×4 IMPLANT

## 2013-01-23 NOTE — ED Notes (Signed)
Br Brabham in to see patient

## 2013-01-23 NOTE — ED Provider Notes (Signed)
History     CSN: [id number]  Arrival date & time 01/23/13  1839   First MD Initiated Contact with Patient 01/23/13 1842      Chief Complaint  Patient presents with  . Leg Pain    (Consider location/radiation/quality/duration/timing/severity/associated sxs/prior treatment) Patient is a 52 y.o. male presenting with leg pain. The history is provided by the patient.  Leg Pain  The incident occurred 1 to 2 hours ago. The incident occurred at home. There was no injury mechanism. The pain is present in the left leg and right leg. The quality of the pain is described as throbbing and aching. The pain is at a severity of 10/10. The pain is severe. The pain has been constant since onset. Associated symptoms include numbness. He reports no foreign bodies present. The symptoms are aggravated by activity, bearing weight and palpation. He has tried nothing for the symptoms.    Past Medical History  Diagnosis Date  . Hypertension   . Gout   . Pinched nerve in neck     Past Surgical History  Procedure Date  . Knee surgery     Family History  Problem Relation Age of Onset  . Hypertension Mother   . Hypertension Father     History  Substance Use Topics  . Smoking status: Never Smoker   . Smokeless tobacco: Not on file  . Alcohol Use: No      Review of Systems  Constitutional: Negative for fever and fatigue.  HENT: Negative for congestion, rhinorrhea and postnasal drip.   Eyes: Negative for photophobia and visual disturbance.  Respiratory: Negative for chest tightness, shortness of breath and wheezing.   Cardiovascular: Negative for chest pain, palpitations and leg swelling.  Gastrointestinal: Negative for nausea, vomiting, abdominal pain and diarrhea.  Genitourinary: Negative for urgency, frequency and difficulty urinating.  Musculoskeletal: Negative for back pain and arthralgias.       Lower extremity leg pain  Skin: Negative for rash and wound.  Neurological: Positive for  weakness and numbness. Negative for headaches.  Psychiatric/Behavioral: Negative for confusion and agitation.    Allergies  Codeine  Home Medications   Current Outpatient Rx  Name  Route  Sig  Dispense  Refill  . ASPIRIN BUFFERED 325 MG PO TABS   Oral   Take 325 mg by mouth 2 (two) times daily as needed. For pain         . ASPIRIN 81 MG PO TABS   Oral   Take 81 mg by mouth daily.         Marland Kitchen LOSARTAN POTASSIUM-HCTZ 100-25 MG PO TABS   Oral   Take 1 tablet by mouth daily.   30 tablet   0   . ADULT MULTIVITAMIN W/MINERALS CH   Oral   Take 1 tablet by mouth daily.         . NEBIVOLOL HCL 5 MG PO TABS   Oral   Take 5 mg by mouth daily.           BP 149/101  Pulse 93  Temp 97.4 F (36.3 C) (Oral)  Resp 20  Ht 6' 1.62" (1.87 m)  Wt 238 lb 1.6 oz (108 kg)  BMI 30.88 kg/m2  SpO2 96%  Physical Exam  Constitutional: He is oriented to person, place, and time. He appears well-developed and well-nourished. No distress.  HENT:  Head: Normocephalic and atraumatic.  Mouth/Throat: Oropharynx is clear and moist.  Eyes: EOM are normal. Pupils are equal, round, and reactive  to light.  Neck: Normal range of motion. Neck supple.  Cardiovascular: Normal rate and normal heart sounds.  An irregularly irregular rhythm present.       Unable to palpate DP, PT, or popliteal pulses bilaterally. Has faint bilateral femoral pulses.  Pulmonary/Chest: Effort normal and breath sounds normal. He has no wheezes. He has no rales.  Abdominal: Soft. Bowel sounds are normal. He exhibits no distension. There is no tenderness. There is no rebound and no guarding.  Musculoskeletal: Normal range of motion. He exhibits no edema and no tenderness.  Lymphadenopathy:    He has no cervical adenopathy.  Neurological: He is alert and oriented to person, place, and time. He displays normal reflexes. No cranial nerve deficit. He exhibits normal muscle tone. Coordination normal.       0/5 muscle strength  right lower extremity from knee down. 4/5 muscle strength left lower extremity left foot. Has no pinprick sensation to right lower extremity from knee down. Has minimal sensation to left lower extremity below knee.  Skin: Skin is warm and dry. No rash noted.  Psychiatric: He has a normal mood and affect. His behavior is normal.    ED Course  Procedures (including critical care time)   Date: 01/23/2013  Rate: 88  Rhythm: atrial fibrillation  QRS Axis: left  Intervals: normal  ST/T Wave abnormalities: nonspecific ST changes  Conduction Disutrbances:none  Narrative Interpretation:   Old EKG Reviewed: none available    Labs Reviewed  CBC WITH DIFFERENTIAL - Abnormal; Notable for the following:    WBC 12.9 (*)     Platelets 132 (*)     Neutrophils Relative 80 (*)     Neutro Abs 10.3 (*)     All other components within normal limits  BASIC METABOLIC PANEL - Abnormal; Notable for the following:    Potassium 3.3 (*)     Glucose, Bld 181 (*)     BUN 27 (*)     Creatinine, Ser 2.21 (*)     Calcium 11.4 (*)     GFR calc non Af Amer 33 (*)     GFR calc Af Amer 38 (*)     All other components within normal limits  POCT I-STAT, CHEM 8 - Abnormal; Notable for the following:    Potassium 3.2 (*)     BUN 27 (*)     Creatinine, Ser 2.40 (*)     Glucose, Bld 181 (*)     Calcium, Ion 1.38 (*)     Hemoglobin 17.3 (*)     All other components within normal limits  HEPARIN LEVEL (UNFRACTIONATED)  HEPARIN LEVEL (UNFRACTIONATED)  CBC  PREPARE RBC (CROSSMATCH)  TYPE AND SCREEN   No results found.   1. Terminal aortic occlusion       MDM  80M here with lower extremity pain and weakness that started acutely about 2-3 hours ago. Has remote history of a. Fib but only on aspirin. Denies chest pain, abdominal pain, or dyspnea. EKG reveals afib. Unable to palpate DP, PT, or popliateal pulses. Unable to fund pulse with Doppler as well. No sensation and no muscle strength to right lower  extremity from knee down. Right foot cool to palpation. Left foot with limited muscle strength and decreased sensation. Concern for ischemic lower extremities secondary to emboli. Started on heparin immediately and vascular surgery consulted. CTA chest/abd/pelvis/lower extremities ordered but creatinine found to be 2. Vascular took to OR emergently without imaging.  Little Ishikawa, MD 01/23/13 2100

## 2013-01-23 NOTE — ED Notes (Addendum)
Pt began exp bil LE pain and numbness 1 hour after having therapy for pinched cervical nerve.  Pt has been going through therapy x 1 month and states nothing is different.  Only change is that he had a cardiac workout this am.  Pt diaphoretic.  Able to move L foot, but not R.  Denies back pain.

## 2013-01-23 NOTE — Anesthesia Preprocedure Evaluation (Addendum)
Anesthesia Evaluation  Patient identified by MRN, date of birth, ID band Patient awake    Reviewed: Allergy & Precautions, H&P , NPO status , Patient's Chart, lab work & pertinent test results  Airway Mallampati: II      Dental   Pulmonary neg pulmonary ROS,  breath sounds clear to auscultation        Cardiovascular hypertension, Rate:Normal  Cardiac history noted.History of irrregular heart beat as per wife.   Neuro/Psych TIA   GI/Hepatic Neg liver ROS, Last meal approximately 4pm. Risks of emergency surgery discussed with patient and wife.   Endo/Other  negative endocrine ROS  Renal/GU negative Renal ROS     Musculoskeletal   Abdominal   Peds  Hematology   Anesthesia Other Findings   Reproductive/Obstetrics                         Anesthesia Physical Anesthesia Plan  ASA: III  Anesthesia Plan: General   Post-op Pain Management:    Induction: Intravenous  Airway Management Planned: Oral ETT  Additional Equipment:   Intra-op Plan:   Post-operative Plan: Possible Post-op intubation/ventilation  Informed Consent:   Dental advisory given  Plan Discussed with: CRNA and Anesthesiologist  Anesthesia Plan Comments:         Anesthesia Quick Evaluation

## 2013-01-23 NOTE — Anesthesia Procedure Notes (Signed)
Procedure Name: Intubation Date/Time: 01/23/2013 9:55 PM Performed by: Valetta Fuller Pre-anesthesia Checklist: Patient identified, Emergency Drugs available, Suction available and Patient being monitored Patient Re-evaluated:Patient Re-evaluated prior to inductionOxygen Delivery Method: Circle system utilized Preoxygenation: Pre-oxygenation with 100% oxygen Intubation Type: IV induction, Rapid sequence and Cricoid Pressure applied Laryngoscope Size: Miller and 2 Grade View: Grade I Tube type: Oral Tube size: 7.5 mm Number of attempts: 1 Airway Equipment and Method: Stylet Placement Confirmation: ETT inserted through vocal cords under direct vision,  positive ETCO2 and breath sounds checked- equal and bilateral Secured at: 23 cm Tube secured with: Tape Dental Injury: Teeth and Oropharynx as per pre-operative assessment

## 2013-01-23 NOTE — Consult Note (Signed)
[facility]      Consult Note  Patient name: Isaiah [name] MRN: [id number] DOB: October 15, 1961 Sex: male  Consulting Physician:  Emergency department  Reason for Consult:  Chief Complaint  Patient presents with  . Leg Pain    HISTORY OF PRESENT ILLNESS: This is a 52 year old gentleman who was in his normal state of affairs earlier today. Approximately 3 hours ago he began having numbness and weakness in his legs. He also complains of pain. There were no aggravating or relieving factors. He has gotten to the point where he can hardly move either leg and his sensation is decreased. The patient has a history of an irregular heartbeat since birth. He has been on Coumadin in the past. He has been taken off of his Coumadin and placed on aspirin which he admits to not taking regularly. His other medical problem includes hypertension. He is a nonsmoker  Past Medical History  Diagnosis Date  . Hypertension   . Gout   . Pinched nerve in neck     Past Surgical History  Procedure Date  . Knee surgery     History   Social History  . Marital Status: Divorced    Spouse Name: N/A    Number of Children: N/A  . Years of Education: N/A   Occupational History  . Not on file.   Social History Main Topics  . Smoking status: Never Smoker   . Smokeless tobacco: Not on file  . Alcohol Use: No  . Drug Use: No  . Sexually Active: Yes     Comment: married   Other Topics Concern  . Not on file   Social History Narrative  . No narrative on file    Family History  Problem Relation Age of Onset  . Hypertension Mother   . Hypertension Father     Allergies as of 01/23/2013 - Review Complete 01/23/2013  Allergen Reaction Noted  . Codeine Itching 12/06/2012    No current facility-administered medications on file prior to encounter.   Current Outpatient Prescriptions on File Prior to Encounter  Medication Sig Dispense Refill  . losartan-hydrochlorothiazide  (HYZAAR) 100-25 MG per tablet Take 1 tablet by mouth daily.  30 tablet  0  . nebivolol (BYSTOLIC) 5 MG tablet Take 5 mg by mouth daily.         REVIEW OF SYSTEMS: Cardiovascular: No chest pain, chest pressure. Positive for irregular heartbeat  Pulmonary: No productive cough, asthma or wheezing. Neurologic: No weakness, paresthesias, aphasia, or amaurosis. No dizziness. Hematologic: No bleeding problems or clotting disorders. Musculoskeletal: No joint pain or joint swelling. Gastrointestinal: No blood in stool or hematemesis Genitourinary: No dysuria or hematuria. Psychiatric:: No history of major depression. Integumentary: No rashes or ulcers. Constitutional: No fever or chills.  PHYSICAL EXAMINATION: General: The patient appears their stated age.  Vital signs are BP 149/101  Pulse 93  Temp 97.4 F (36.3 C) (Oral)  Resp 20  Ht 6' 1.62" (1.87 m)  Wt 238 lb 1.6 oz (108 kg)  BMI 30.88 kg/m2  SpO2 96% Pulmonary: Respirations are non-labored HEENT:  No gross abnormalities Abdomen: Soft and non-tender  Musculoskeletal: There are no major deformities.   Neurologic: Bilateral lower extremities are cool to the touch from the midcalf down. He has decreased motor function on the right foot as well as decreased sensation. These are more pronounced on the right than the left but definitely present on both extremities. Skin: There are no ulcer or  rashes noted. Psychiatric: The patient has normal affect. Cardiovascular: Irregular rate. I cannot palpate or Doppler femoral or pedal pulses bilaterally  Diagnostic Studies: None  Medication Changes: The patient had heparin ordered  Assessment:   bilateral ischemic lower extremities Plan: Based on clinical examination, the patient has bilateral lower extremity ischemia. Presumably, this is from aortoiliac occlusion. The most likely explanation is from his atrial fibrillation. I have discussed with the patient that this is a limb threatening  problem. He needs to go emergently to the operating room for an attempt at surgical revascularization. I discussed making bilateral femoral incisions and hopefully proceeding with simple thrombectomy. I also discussed the possibility of performing a fasciotomy either at the time of his embolectomy or later if the develops a compartment syndrome. He understands that this is a limb threatening situation. We will proceed emergently.     Eldridge Abrahams, M.D. [facility] Office: [phone] Pager:  [phone]

## 2013-01-23 NOTE — ED Provider Notes (Signed)
I saw and evaluated the patient, reviewed the resident's note and I agree with the findings and plan.  Acute onset of bilateral lower extremity pain, numbness. No trauma. No abdominal pain or back pain. Bilateral lower extremities are cool from feet to mid calf. Unable to palpate DP or PT pulses. Unable to palpate femoral pulses bilaterally. Concern for aortic occlusion. Discussed with Dr. Trula Slade of vascular surgery who will take patient to the OR. Creatinine elevated at 2.5 precludes CT angiogram.  CRITICAL CARE Performed by: Ezequiel Essex   Total critical care time: 30  Critical care time was exclusive of separately billable procedures and treating other patients.  Critical care was necessary to treat or prevent imminent or life-threatening deterioration.  Critical care was time spent personally by me on the following activities: development of treatment plan with patient and/or surrogate as well as nursing, discussions with consultants, evaluation of patient's response to treatment, examination of patient, obtaining history from patient or surrogate, ordering and performing treatments and interventions, ordering and review of laboratory studies, ordering and review of radiographic studies, pulse oximetry and re-evaluation of patient's condition.   Ezequiel Essex, MD 01/23/13 2207

## 2013-01-23 NOTE — ED Notes (Signed)
Unable to locate pulses in either lower ext.

## 2013-01-23 NOTE — Progress Notes (Signed)
ANTICOAGULATION CONSULT NOTE - Initial Consult  Pharmacy Consult for [facility] Indication: lower extremity ischemia  Allergies  Allergen Reactions  . Codeine Itching    Patient Measurements: Height: 6' 1.62" (187 cm) Weight: 238 lb 1.6 oz (108 kg) IBW/kg (Calculated) : 81.33  Heparin Dosing Weight: 103kg  Vital Signs: Temp: 97.4 F (36.3 C) (01/28 1851) Temp src: Oral (01/28 1851) BP: 149/101 mmHg (01/28 1851) Pulse Rate: 93  (01/28 1851)  Labs:  Basename 01/23/13 1943 01/23/13 1924  HGB 17.3* 16.1  HCT 51.0 47.4  PLT -- 132*  APTT -- --  LABPROT -- --  INR -- --  HEPARINUNFRC -- --  CREATININE 2.40* --  CKTOTAL -- --  CKMB -- --  TROPONINI -- --    Estimated Creatinine Clearance: 47.9 ml/min (by C-G formula based on Cr of 2.4).   Medical History: Past Medical History  Diagnosis Date  . Hypertension   . Gout   . Pinched nerve in neck     Medications:   (Not in a hospital admission)  Assessment: 52 y/o male patient admitted with b/l LE pain, numbess, and lack of pulses requiring anticoagulation for r/o ischemia.  Goal of Therapy:  Heparin level 0.3-0.7 units/ml Monitor platelets by anticoagulation protocol: Yes   Plan:  Heparin 5000 unit IV bolus followed by infusion at 1600 units/hr. Check 6 hour heparin level with daily cbc and heparin level.  Davonna Belling, PharmD, BCPS Pager [phone] 01/23/2013,7:55 PM

## 2013-01-23 NOTE — ED Notes (Signed)
BP left arm 146/102  Right arm 146/97

## 2013-01-24 ENCOUNTER — Encounter (HOSPITAL_COMMUNITY): Payer: Self-pay | Admitting: Cardiology

## 2013-01-24 DIAGNOSIS — I4891 Unspecified atrial fibrillation: Secondary | ICD-10-CM

## 2013-01-24 DIAGNOSIS — N183 Chronic kidney disease, stage 3 unspecified: Secondary | ICD-10-CM

## 2013-01-24 DIAGNOSIS — I998 Other disorder of circulatory system: Secondary | ICD-10-CM

## 2013-01-24 DIAGNOSIS — I10 Essential (primary) hypertension: Secondary | ICD-10-CM | POA: Diagnosis present

## 2013-01-24 DIAGNOSIS — I7409 Other arterial embolism and thrombosis of abdominal aorta: Secondary | ICD-10-CM

## 2013-01-24 DIAGNOSIS — N529 Male erectile dysfunction, unspecified: Secondary | ICD-10-CM | POA: Insufficient documentation

## 2013-01-24 DIAGNOSIS — I2510 Atherosclerotic heart disease of native coronary artery without angina pectoris: Secondary | ICD-10-CM | POA: Diagnosis present

## 2013-01-24 DIAGNOSIS — I4821 Permanent atrial fibrillation: Secondary | ICD-10-CM | POA: Diagnosis present

## 2013-01-24 HISTORY — DX: Other disorder of circulatory system: I99.8

## 2013-01-24 HISTORY — DX: Chronic kidney disease, stage 3 unspecified: N18.30

## 2013-01-24 HISTORY — DX: Permanent atrial fibrillation: I48.21

## 2013-01-24 HISTORY — DX: Other arterial embolism and thrombosis of abdominal aorta: I74.09

## 2013-01-24 LAB — BASIC METABOLIC PANEL
Calcium: 10 mg/dL (ref 8.4–10.5)
Chloride: 102 mEq/L (ref 96–112)
Chloride: 103 mEq/L (ref 96–112)
Creatinine, Ser: 1.62 mg/dL — ABNORMAL HIGH (ref 0.50–1.35)
GFR calc Af Amer: 52 mL/min — ABNORMAL LOW (ref >90)
GFR calc Af Amer: 56 mL/min — ABNORMAL LOW (ref >90)
GFR calc non Af Amer: 45 mL/min — ABNORMAL LOW (ref >90)
GFR calc non Af Amer: 48 mL/min — ABNORMAL LOW (ref >90)
Glucose, Bld: 150 mg/dL — ABNORMAL HIGH (ref 70–99)
Potassium: 3.7 mEq/L (ref 3.5–5.1)
Sodium: 136 mEq/L (ref 135–145)

## 2013-01-24 LAB — CBC
MCH: 31.5 pg (ref 26.0–34.0)
MCHC: 33.1 g/dL (ref 30.0–36.0)
MCV: 95.2 fL (ref 78.0–100.0)
Platelets: 123 10*3/uL — ABNORMAL LOW (ref 150–400)
RDW: 12.8 % (ref 11.5–15.5)
WBC: 8.2 10*3/uL (ref 4.0–10.5)

## 2013-01-24 LAB — HEPARIN LEVEL (UNFRACTIONATED)
Heparin Unfractionated: >2 IU/mL — ABNORMAL HIGH (ref 0.30–0.70)
Heparin Unfractionated: 0.81 IU/mL — ABNORMAL HIGH (ref 0.30–0.70)

## 2013-01-24 LAB — MRSA PCR SCREENING: MRSA by PCR: NEGATIVE

## 2013-01-24 MED ORDER — HEPARIN (PORCINE) IN NACL 100-0.45 UNIT/ML-% IJ SOLN
1250.0000 [IU]/h | INTRAMUSCULAR | Status: DC
  Administered 2013-01-24: 800 [IU]/h via INTRAVENOUS
  Administered 2013-01-24 (×2): 1000 [IU]/h via INTRAVENOUS
  Administered 2013-01-25: 1250 [IU]/h via INTRAVENOUS
  Filled 2013-01-24 (×5): qty 250

## 2013-01-24 MED ORDER — LABETALOL HCL 5 MG/ML IV SOLN
10.0000 mg | INTRAVENOUS | Status: DC | PRN
  Filled 2013-01-24: qty 4

## 2013-01-24 MED ORDER — OXYCODONE HCL 5 MG PO TABS: 5.0000 mg | ORAL_TABLET | ORAL | Status: DC | PRN

## 2013-01-24 MED ORDER — MAGNESIUM SULFATE 40 MG/ML IJ SOLN
2.0000 g | Freq: Once | INTRAMUSCULAR | Status: AC | PRN
  Filled 2013-01-24: qty 50

## 2013-01-24 MED ORDER — SODIUM CHLORIDE 0.9 % IV SOLN
INTRAVENOUS | Status: DC
  Administered 2013-01-24 – 2013-01-25 (×3): via INTRAVENOUS

## 2013-01-24 MED ORDER — POTASSIUM CHLORIDE CRYS ER 20 MEQ PO TBCR: 20.0000 meq | EXTENDED_RELEASE_TABLET | Freq: Once | ORAL | Status: AC | PRN

## 2013-01-24 MED ORDER — SENNOSIDES-DOCUSATE SODIUM 8.6-50 MG PO TABS
1.0000 | ORAL_TABLET | Freq: Every evening | ORAL | Status: DC | PRN
  Filled 2013-01-24: qty 1

## 2013-01-24 MED ORDER — ACETAMINOPHEN 650 MG RE SUPP: 325.0000 mg | RECTAL | Status: DC | PRN

## 2013-01-24 MED ORDER — LOSARTAN POTASSIUM 50 MG PO TABS
100.0000 mg | ORAL_TABLET | Freq: Every day | ORAL | Status: DC
  Administered 2013-01-24 – 2013-01-26 (×3): 100 mg via ORAL
  Filled 2013-01-24 (×3): qty 2

## 2013-01-24 MED ORDER — PANTOPRAZOLE SODIUM 40 MG PO TBEC
40.0000 mg | DELAYED_RELEASE_TABLET | Freq: Every day | ORAL | Status: DC
  Administered 2013-01-24 – 2013-01-25 (×2): 40 mg via ORAL
  Filled 2013-01-24 (×3): qty 1

## 2013-01-24 MED ORDER — HYDROCHLOROTHIAZIDE 25 MG PO TABS
25.0000 mg | ORAL_TABLET | Freq: Every day | ORAL | Status: DC
  Administered 2013-01-24 – 2013-01-26 (×3): 25 mg via ORAL
  Filled 2013-01-24 (×3): qty 1

## 2013-01-24 MED ORDER — MORPHINE SULFATE 2 MG/ML IJ SOLN: 2.0000 mg | INTRAMUSCULAR | Status: DC | PRN

## 2013-01-24 MED ORDER — HEMOSTATIC AGENTS (NO CHARGE) OPTIME
TOPICAL | Status: DC | PRN
  Administered 2013-01-24 (×2): 1 via TOPICAL

## 2013-01-24 MED ORDER — GUAIFENESIN-DM 100-10 MG/5ML PO SYRP: 15.0000 mL | ORAL_SOLUTION | ORAL | Status: DC | PRN

## 2013-01-24 MED ORDER — LOSARTAN POTASSIUM-HCTZ 100-25 MG PO TABS: 1.0000 | ORAL_TABLET | Freq: Every day | ORAL | Status: DC

## 2013-01-24 MED ORDER — NEOSTIGMINE METHYLSULFATE 1 MG/ML IJ SOLN
INTRAMUSCULAR | Status: DC | PRN
  Administered 2013-01-24: 5 mg via INTRAVENOUS

## 2013-01-24 MED ORDER — ASPIRIN BUFFERED 325 MG PO TABS: 325.0000 mg | ORAL_TABLET | Freq: Every day | ORAL | Status: DC

## 2013-01-24 MED ORDER — DEXTROSE 5 % IV SOLN
1.5000 g | Freq: Two times a day (BID) | INTRAVENOUS | Status: AC
  Administered 2013-01-24 (×2): 1.5 g via INTRAVENOUS
  Filled 2013-01-24 (×2): qty 1.5

## 2013-01-24 MED ORDER — NEBIVOLOL HCL 5 MG PO TABS
5.0000 mg | ORAL_TABLET | Freq: Every day | ORAL | Status: DC
  Administered 2013-01-24: 5 mg via ORAL
  Filled 2013-01-24 (×2): qty 1

## 2013-01-24 MED ORDER — HYDRALAZINE HCL 20 MG/ML IJ SOLN
10.0000 mg | INTRAMUSCULAR | Status: DC | PRN
  Filled 2013-01-24: qty 0.5

## 2013-01-24 MED ORDER — METOPROLOL TARTRATE 1 MG/ML IV SOLN: 2.0000 mg | INTRAVENOUS | Status: DC | PRN

## 2013-01-24 MED ORDER — ONDANSETRON HCL 4 MG/2ML IJ SOLN
INTRAMUSCULAR | Status: DC | PRN
  Administered 2013-01-24: 4 mg via INTRAVENOUS

## 2013-01-24 MED ORDER — BISACODYL 5 MG PO TBEC: 5.0000 mg | DELAYED_RELEASE_TABLET | Freq: Every day | ORAL | Status: DC | PRN

## 2013-01-24 MED ORDER — PHENOL 1.4 % MT LIQD: 1.0000 | OROMUCOSAL | Status: DC | PRN

## 2013-01-24 MED ORDER — ONDANSETRON HCL 4 MG/2ML IJ SOLN: 4.0000 mg | Freq: Four times a day (QID) | INTRAMUSCULAR | Status: DC | PRN

## 2013-01-24 MED ORDER — DIPHENHYDRAMINE HCL 25 MG PO CAPS: 25.0000 mg | ORAL_CAPSULE | ORAL | Status: DC | PRN

## 2013-01-24 MED ORDER — DOPAMINE-DEXTROSE 3.2-5 MG/ML-% IV SOLN: 3.0000 ug/kg/min | INTRAVENOUS | Status: DC

## 2013-01-24 MED ORDER — DOCUSATE SODIUM 100 MG PO CAPS
100.0000 mg | ORAL_CAPSULE | Freq: Every day | ORAL | Status: DC
  Administered 2013-01-25 – 2013-01-26 (×2): 100 mg via ORAL
  Filled 2013-01-24 (×2): qty 1

## 2013-01-24 MED ORDER — FENTANYL CITRATE 0.05 MG/ML IJ SOLN: 25.0000 ug | INTRAMUSCULAR | Status: DC | PRN

## 2013-01-24 MED ORDER — ASPIRIN EC 325 MG PO TBEC
325.0000 mg | DELAYED_RELEASE_TABLET | Freq: Every day | ORAL | Status: DC
  Administered 2013-01-24 – 2013-01-26 (×3): 325 mg via ORAL
  Filled 2013-01-24 (×3): qty 1

## 2013-01-24 MED ORDER — SODIUM CHLORIDE 0.9 % IV SOLN: 500.0000 mL | Freq: Once | INTRAVENOUS | Status: AC | PRN

## 2013-01-24 MED ORDER — GLYCOPYRROLATE 0.2 MG/ML IJ SOLN
INTRAMUSCULAR | Status: DC | PRN
  Administered 2013-01-24: .8 mg via INTRAVENOUS

## 2013-01-24 MED ORDER — ACETAMINOPHEN 325 MG PO TABS: 325.0000 mg | ORAL_TABLET | ORAL | Status: DC | PRN

## 2013-01-24 NOTE — Consult Note (Addendum)
Reason for Consult: chronic/permanent A Fib needing antiocoagulation  Referring Physician: Dr. Trula Slade  Cardiologist:  Dr. Carles Collet Isaiah [name] is an 52 y.o. male.    Chief Complaint:  Bil. Leg pain and admitted 01/23/13 with Terminal aortic occlusion.  HPI: This is a 52 year old gentleman who was in his normal state of affairs until 01/23/13 when at approximately 3 hours prior to admit he began having numbness and weakness in his legs. He also complained of pain. There were no aggravating or relieving factors. He had gotten to the point where he could hardly move either leg and his sensation is decreased. The patient has a history of an irregular heartbeat since birth. He has been on Coumadin in the past. He has been taken off of his Coumadin and placed on aspirin which he admits to not taking regularly. His other medical problem includes hypertension. He is a nonsmoker.  In August of 07/2012 he saw Dr. Ellyn Hack and they discussed importance of anticoagulation with chronic A. Fib and hx of TIA.  Pt refused coumadin and did not wish to take meds that were twice a day dosing so pt was started on Xarelto.  Pt tells me today he thought the xarelto was for short term use.  He still has some at home.     Prior cardiac history includes Rt. And Lt.  cardiac cath 2009 with with cardiac output by Fick of 6.3 and thremal dilution of 5.34.  Non obstructive CAD though he did have some intramyocardial bridging of the LAD.  Normal carotid dopplers in 2009.    Past Medical History  Diagnosis Date  . Hypertension   . Gout   . Pinched nerve in neck   . Extremity ischemia, critical bil lower ext. 01/24/2013  . Terminal aortic occlusion 01/24/2013  . Atrial fibrillation, permanent 01/24/2013  . HTN (hypertension) 01/24/2013  . Erectile dysfunction 01/24/2013  . CKD (chronic kidney disease) stage 3, GFR 30-59 ml/min 01/24/2013  . CAD (coronary artery disease), non obstructive by cardiac cath 2009 01/24/2013     Past Surgical History  Procedure Date  . Knee surgery   . Cardiac catheterization     Family History  Problem Relation Age of Onset  . Hypertension Mother   . Hypertension Father    Social History:  reports that he has never smoked. He does not have any smokeless tobacco history on file. He reports that he does not drink alcohol or use illicit drugs. Married, 5 children, 4 grandchildren.    Allergies:  Allergies  Allergen Reactions  . Codeine Itching    Medications Prior to Admission  Medication Sig Dispense Refill  . aspirin 325 MG buffered tablet Take 325 mg by mouth 2 (two) times daily as needed. For pain      . aspirin 81 MG tablet Take 81 mg by mouth daily.      Marland Kitchen losartan-hydrochlorothiazide (HYZAAR) 100-25 MG per tablet Take 1 tablet by mouth daily.  30 tablet  0  . Multiple Vitamin (MULTIVITAMIN WITH MINERALS) TABS Take 1 tablet by mouth daily.      . nebivolol (BYSTOLIC) 5 MG tablet Take 5 mg by mouth daily.        Results for orders placed during the hospital encounter of 01/23/13 (from the past 48 hour(s))  CBC WITH DIFFERENTIAL     Status: Abnormal   Collection Time   01/23/13  7:24 PM      Component Value Range Comment   WBC 12.9 (*)  4.0 - 10.5 K/uL    RBC 4.87  4.22 - 5.81 MIL/uL    Hemoglobin 16.1  13.0 - 17.0 g/dL    HCT 47.4  39.0 - 52.0 %    MCV 97.3  78.0 - 100.0 fL    MCH 33.1  26.0 - 34.0 pg    MCHC 34.0  30.0 - 36.0 g/dL    RDW 12.8  11.5 - 15.5 %    Platelets 132 (*) 150 - 400 K/uL    Neutrophils Relative 80 (*) 43 - 77 %    Neutro Abs 10.3 (*) 1.7 - 7.7 K/uL    Lymphocytes Relative 15  12 - 46 %    Lymphs Abs 1.9  0.7 - 4.0 K/uL    Monocytes Relative 5  3 - 12 %    Monocytes Absolute 0.6  0.1 - 1.0 K/uL    Eosinophils Relative 0  0 - 5 %    Eosinophils Absolute 0.0  0.0 - 0.7 K/uL    Basophils Relative 0  0 - 1 %    Basophils Absolute 0.0  0.0 - 0.1 K/uL   BASIC METABOLIC PANEL     Status: Abnormal   Collection Time   01/23/13  7:24  PM      Component Value Range Comment   Sodium 135  135 - 145 mEq/L    Potassium 3.3 (*) 3.5 - 5.1 mEq/L    Chloride 96  96 - 112 mEq/L    CO2 25  19 - 32 mEq/L    Glucose, Bld 181 (*) 70 - 99 mg/dL    BUN 27 (*) 6 - 23 mg/dL    Creatinine, Ser 2.21 (*) 0.50 - 1.35 mg/dL    Calcium 11.4 (*) 8.4 - 10.5 mg/dL    GFR calc non Af Amer 33 (*) >90 mL/min    GFR calc Af Amer 38 (*) >90 mL/min   POCT I-STAT, CHEM 8     Status: Abnormal   Collection Time   01/23/13  7:43 PM      Component Value Range Comment   Sodium 137  135 - 145 mEq/L    Potassium 3.2 (*) 3.5 - 5.1 mEq/L    Chloride 101  96 - 112 mEq/L    BUN 27 (*) 6 - 23 mg/dL    Creatinine, Ser 2.40 (*) 0.50 - 1.35 mg/dL    Glucose, Bld 181 (*) 70 - 99 mg/dL    Calcium, Ion 1.38 (*) 1.12 - 1.23 mmol/L    TCO2 29  0 - 100 mmol/L    Hemoglobin 17.3 (*) 13.0 - 17.0 g/dL    HCT 51.0  39.0 - 52.0 %   PREPARE RBC (CROSSMATCH)     Status: Normal   Collection Time   01/23/13  8:45 PM      Component Value Range Comment   Order Confirmation ORDER PROCESSED BY BLOOD BANK     TYPE AND SCREEN     Status: Normal (Preliminary result)   Collection Time   01/23/13  8:45 PM      Component Value Range Comment   ABO/RH(D) B POS      Antibody Screen NEG      Sample Expiration 01/26/2013      Unit Number [id number]      Blood Component Type RED CELLS,LR      Unit division 00      Status of Unit ALLOCATED      Transfusion Status OK TO  TRANSFUSE      Crossmatch Result Compatible      Unit Number [id number]      Blood Component Type RED CELLS,LR      Unit division 00      Status of Unit ALLOCATED      Transfusion Status OK TO TRANSFUSE      Crossmatch Result Compatible     ABO/RH     Status: Normal   Collection Time   01/23/13  8:45 PM      Component Value Range Comment   ABO/RH(D) B POS     HEPARIN LEVEL (UNFRACTIONATED)     Status: Abnormal   Collection Time   01/24/13  1:00 AM      Component Value Range Comment   Heparin  Unfractionated >2.00 (*) 0.30 - 0.70 IU/mL   CBC     Status: Abnormal   Collection Time   01/24/13  1:00 AM      Component Value Range Comment   WBC 9.7  4.0 - 10.5 K/uL    RBC 4.31  4.22 - 5.81 MIL/uL    Hemoglobin 14.1  13.0 - 17.0 g/dL DELTA CHECK NOTED   HCT 41.4  39.0 - 52.0 %    MCV 96.1  78.0 - 100.0 fL    MCH 32.7  26.0 - 34.0 pg    MCHC 34.1  30.0 - 36.0 g/dL    RDW 12.7  11.5 - 15.5 %    Platelets 131 (*) 150 - 400 K/uL   BASIC METABOLIC PANEL     Status: Abnormal   Collection Time   01/24/13  1:33 AM      Component Value Range Comment   Sodium 136  135 - 145 mEq/L    Potassium 3.7  3.5 - 5.1 mEq/L    Chloride 102  96 - 112 mEq/L    CO2 24  19 - 32 mEq/L    Glucose, Bld 150 (*) 70 - 99 mg/dL    BUN 24 (*) 6 - 23 mg/dL    Creatinine, Ser 1.71 (*) 0.50 - 1.35 mg/dL    Calcium 9.7  8.4 - 10.5 mg/dL    GFR calc non Af Amer 45 (*) >90 mL/min    GFR calc Af Amer 52 (*) >90 mL/min   MRSA PCR SCREENING     Status: Normal   Collection Time   01/24/13  4:40 AM      Component Value Range Comment   MRSA by PCR NEGATIVE  NEGATIVE   CBC     Status: Abnormal   Collection Time   01/24/13  5:59 AM      Component Value Range Comment   WBC 8.2  4.0 - 10.5 K/uL    RBC 4.19 (*) 4.22 - 5.81 MIL/uL    Hemoglobin 13.2  13.0 - 17.0 g/dL    HCT 39.9  39.0 - 52.0 %    MCV 95.2  78.0 - 100.0 fL    MCH 31.5  26.0 - 34.0 pg    MCHC 33.1  30.0 - 36.0 g/dL    RDW 12.8  11.5 - 15.5 %    Platelets 123 (*) 150 - 400 K/uL   BASIC METABOLIC PANEL     Status: Abnormal   Collection Time   01/24/13  5:59 AM      Component Value Range Comment   Sodium 138  135 - 145 mEq/L    Potassium 3.7  3.5 - 5.1 mEq/L  Chloride 103  96 - 112 mEq/L    CO2 26  19 - 32 mEq/L    Glucose, Bld 130 (*) 70 - 99 mg/dL    BUN 22  6 - 23 mg/dL    Creatinine, Ser 1.62 (*) 0.50 - 1.35 mg/dL    Calcium 10.0  8.4 - 10.5 mg/dL    GFR calc non Af Amer 48 (*) >90 mL/min    GFR calc Af Amer 56 (*) >90 mL/min    No  results found.  ROS: General:no colds or fevers, no weight changes Skin:no rashes or ulcers HEENT:no blurred vision, no congestion CV:no chest pain PUL:no SOB GI:no diarrhea constipation or melena, no indigestion GU:no hematuria, no dysuria MS:no joint pain, + claudication- severe on admit Neuro:no syncope, no lightheadedness Endo:no diabetes, no thyroid disease   Blood pressure 109/82, pulse 53, temperature 97.8 F (36.6 C), temperature source Oral, resp. rate 20, height 6' 1.62" (1.87 m), weight 108 kg (238 lb 1.6 oz), SpO2 97.00%. PE: General: alert and oriented, pleasant affect. No acute distress  Skin:warm and dry, brisk capillary refill HEENT:normocephalic, sclera clear Neck:supple, non JVD, no bruits Heart:irreg irreg no murmur, gallup rub or click Lungs:clear without rales, rhonchi or wheezes Abd:+ BS, soft, non tender Ext:no edema 1+ pedal on rt.  1+ post tib on lt.  Neuro:alert and oriented X 3 MAE, follows commands, answers questions approp.    Assessment/Plan Principal Problem:  *Extremity ischemia, critical bil lower ext. Active Problems:  Terminal aortic occlusion  Atrial fibrillation, permanent  HTN (hypertension)  CKD (chronic kidney disease) stage 3, GFR 30-59 ml/min  CAD (coronary artery disease), non obstructive by cardiac cath 2009  PLAN: Date of Surgery: 01/23/2013 - 01/24/2013  Procedure(s):  ABDOMINAL AORTAGRAM  EMBOLECTOMY bilateral femoral embolectomies.   currently on IV heparin.  Need permanent anticoagulation. We discussed importance of anticoagulation at this time.  He prefers to resume Xarelto if possible.  He does not wish coumadin secondary to frequent sticks.  MD to see and make further decisions.  2 D Echo ordered.   A fib with controlled rate occ. Pauses 1.5 secs.  INGOLD,LAURA R 01/24/2013, 10:21 AM   Patient seen and examined. Agree with assessment and plan. 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 He apparently had stopped taking xarelto 6 mo ago and was only intermittently taking aspirin. He has nonobstructive CAD noted on cath in 2009. Also upon questioning I suspect that he has untreated sleep apnea. Currently on IV heparin. Will need long term anticoagulation. He prefers once a day dosing and would prefer not to be on coumadin. Continue hydration. Renal function improved today with Cr 1.62. If Cr clearance over 50 then xarelto 20 mg daily with supper and ASA 81 mg chronically. Will check 2 d echo today with contrast, suspect cardiac source of embolization. Also recommend sleep study as outpatient for probable OSA.   Troy Sine, MD, [facility] 01/24/2013 11:19 AM

## 2013-01-24 NOTE — Transfer of Care (Signed)
Immediate Anesthesia Transfer of Care Note  Patient: Isaiah [name]  Procedure(s) Performed: Procedure(s) (LRB) with comments: ABDOMINAL AORTAGRAM (N/A) EMBOLECTOMY (Bilateral) - Bilateral femoral Embolectomy, Bilateral Iliac Embolectomy.  Patient Location: PACU  Anesthesia Type:General  Level of Consciousness: awake, alert  and oriented  Airway & Oxygen Therapy: Patient connected to nasal cannula oxygen  Post-op Assessment: Report given to PACU RN, Post -op Vital signs reviewed and stable and Patient moving all extremities X 4  Post vital signs: Reviewed and stable  Complications: No apparent anesthesia complications

## 2013-01-24 NOTE — Progress Notes (Signed)
Utilization review completed.  

## 2013-01-24 NOTE — Progress Notes (Signed)
[facility] Progress Note:  Called by RN because pt had few seconds of LOC when he was standing at the sink washing. He did not fall. Pt was immediately placed in a chair and he remains neurologically intact.   [facility] Neuro exam good and equal strength in BUE,BLE. Speech is fluent He still has some residual numbness in lateral right shin and foot as he had pre -op. Palpable DP and PT right Palpable DP on left  Both calves soft. NT over all compartments in BLE  Assessment: probable vasovagal episode with immediate recovery and no neuro deficits Pt having 2D ECHO now Hx TIA in the past Heparin per Pharmacy dosing - hep level 0.80 and drip decreased to 800 units/hour

## 2013-01-24 NOTE — Progress Notes (Signed)
ANTICOAGULATION CONSULT NOTE - Follow Up Consult  Pharmacy Consult for Heparin Indication: aortic occlusion s/p B/L femoral embolectomy and Afib   Allergies  Allergen Reactions  . Codeine Itching    Patient Measurements: Height: 6' 1.62" (187 cm) Weight: 238 lb 1.6 oz (108 kg) IBW/kg (Calculated) : 81.33  Heparin Dosing Weight: 103kg  Vital Signs: Temp: 97.9 F (36.6 C) (01/29 2000) Temp src: Oral (01/29 2000) BP: 118/84 mmHg (01/29 2000) Pulse Rate: 78  (01/29 2000)  Labs:  Basename 01/24/13 2039 01/24/13 1153 01/24/13 0559 01/24/13 0133 01/24/13 0100 01/23/13 1943 01/23/13 1924  HGB -- -- 13.2 -- 14.1 -- --  HCT -- -- 39.9 -- 41.4 51.0 --  PLT -- -- 123* -- 131* -- 132*  APTT -- 79* -- -- -- -- --  LABPROT -- -- -- -- -- -- --  INR -- -- -- -- -- -- --  HEPARINUNFRC 0.21* 0.81* -- -- >2.00* -- --  CREATININE -- -- 1.62* 1.71* -- 2.40* --  CKTOTAL -- -- -- -- -- -- --  CKMB -- -- -- -- -- -- --  TROPONINI -- -- -- -- -- -- --    Estimated Creatinine Clearance: 71 ml/min (by C-G formula based on Cr of 1.62).   Medications:  Heparin 800 units/hr  Assessment: 51yom on heparin for aortic occlusion s/p femoral embolectomy and Afib. Heparin level (0.21) is now subtherapeutic after rate decrease. Heparin level was previously elevated (0.81) on 1000 units/hr but may have had some residual effects from previous night (Heparin level >2 @ 0100). Will increase back to 1000 units/hr and follow-up AM level. - H/H wnl, Plts low - No significant bleeding reported - No problems with line or infusion per RN  Goal of Therapy:  Heparin level 0.3-0.7 units/ml Monitor platelets by anticoagulation protocol: Yes   Plan:  1. Increase heparin drip to 1000 units/hr (10 ml/hr) 2. Follow-up AM heparin level  Earleen Newport [id number] 01/24/2013,9:31 PM

## 2013-01-24 NOTE — Preoperative (Signed)
Beta Blockers   Reason not to administer Beta Blockers:Not Applicable 

## 2013-01-24 NOTE — Progress Notes (Addendum)
ANTICOAGULATION CONSULT NOTE - Follow Up Consult  Pharmacy Consult for Heparin Indication: terminal aortic occlusion s/p B/L femoral embolectomy and Afib  Allergies  Allergen Reactions  . Codeine Itching    Patient Measurements: Height: 6' 1.62" (187 cm) Weight: 238 lb 1.6 oz (108 kg) IBW/kg (Calculated) : 81.33  Heparin Dosing Weight: 102.3 kg  Vital Signs: Temp: 97.8 F (36.6 C) (01/29 0731) Temp src: Oral (01/29 0731) BP: 109/82 mmHg (01/29 0731) Pulse Rate: 53  (01/29 0731)  Labs:  Basename 01/24/13 0559 01/24/13 0133 01/24/13 0100 01/23/13 1943 01/23/13 1924  HGB 13.2 -- 14.1 -- --  HCT 39.9 -- 41.4 51.0 --  PLT 123* -- 131* -- 132*  APTT -- -- -- -- --  LABPROT -- -- -- -- --  INR -- -- -- -- --  HEPARINUNFRC -- -- >2.00* -- --  CREATININE 1.62* 1.71* -- 2.40* --  CKTOTAL -- -- -- -- --  CKMB -- -- -- -- --  TROPONINI -- -- -- -- --    Estimated Creatinine Clearance: 71 ml/min (by C-G formula based on Cr of 1.62).   Medications:  Infusions:    . sodium chloride 150 mL/hr at 01/24/13 1047  . DOPamine    . heparin 1,000 Units/hr (01/24/13 0600)  . [DISCONTINUED] heparin 1,600 Units/hr (01/23/13 2006)    Assessment: 52 y/o male with terminal aortic occlusion s/p B/L femoral embolectomy and Afib. He has been on heparin at 1000 units/hr since 03:21 this morning per MD orders. Spoke with Rollene Fare from [facility] who ordered a heparin level that has not resulted yet. Pharmacy consulted now to manage heparin. No bleeding noted, H/H and platelets have decreased after procedure.  Goal of Therapy:  Heparin level 0.3-0.7 units/ml Monitor platelets by anticoagulation protocol: Yes   Plan:  -Continue heparin drip at 1000 units/hr -STAT heparin level -Daily heparin level and CBC while on heparin -Monitor for s/sx of bleeding  [facility], [place].D., BCPS Clinical Pharmacist Pager: [phone] 01/24/2013 11:44 AM   Addendum: Heparin level is 0.81 and above goal of  0.3-0.7 units/ml. I am surprised by this level because of patient's weight. No bleeding noted, H/H are within normal limit but decreased from admission, platelets are low.  -Decrease heparin drip to 800 units/hr -Heparin level 6 hours after rate change  [facility], Pharm.D., BCPS Clinical Pharmacist Pager: [phone] 01/24/2013 1:08 PM

## 2013-01-24 NOTE — Evaluation (Signed)
Physical Therapy Evaluation Patient Details Name: Isaiah [name] MRN: [id number] DOB: 13-Feb-1961 Today's Date: 01/24/2013 Time: [id number] PT Time Calculation (min): 33 min  PT Assessment / Plan / Recommendation Clinical Impression  Patient is a 52 y/o male admitted with bilateral LE pain and weakness positive for aortofemoral emboli bilaterally now s/p embolectomy.  Presents with acute pain and LE weakness and decreased sensation limiting independence with mobility.  He will benefit from skilled PT to maximize independence and allow d/c home with family assist PRN and HHPT versus no follow up depending on progress.    PT Assessment  Patient needs continued PT services    Follow Up Recommendations  Home health PT;No PT follow up (depending on progress)          Equipment Recommendations  None recommended by PT       Frequency Min 3X/week    Precautions / Restrictions Precautions Precautions: Fall   Pertinent Vitals/Pain 5-6/10 with mobility; HR max with mobility 150 (consistently 120's,) RN aware      Mobility  Bed Mobility Details for Bed Mobility Assistance: up in recliner Transfers Transfers: Stand to Sit;Sit to Stand Sit to Stand: With armrests;With upper extremity assist;From chair/3-in-1;4: Min guard Stand to Sit: With armrests;With upper extremity assist;To chair/3-in-1;4: Min guard Details for Transfer Assistance: moves slowly due to pain Ambulation/Gait Ambulation/Gait Assistance: 4: Min guard Ambulation Distance (Feet): 150 Feet Assistive device: Rolling walker Ambulation/Gait Assistance Details: slow and cautious, but with little assist Gait Pattern: Wide base of support;Decreased hip/knee flexion - right;Decreased hip/knee flexion - left;Decreased stride length           PT Diagnosis: Difficulty walking;Generalized weakness;Acute pain  PT Problem List: Decreased strength;Decreased activity tolerance;Decreased balance;Decreased mobility;Pain;Impaired  sensation;Decreased knowledge of use of DME PT Treatment Interventions: DME instruction;Gait training;Stair training;Functional mobility training;Patient/family education;Therapeutic activities;Therapeutic exercise;Balance training   PT Goals Acute Rehab PT Goals PT Goal Formulation: With patient Time For Goal Achievement: 01/31/13 Potential to Achieve Goals: Good Pt will go Supine/Side to Sit: with modified independence PT Goal: Supine/Side to Sit - Progress: Goal set today Pt will go Sit to Supine/Side: with supervision PT Goal: Sit to Supine/Side - Progress: Goal set today Pt will go Sit to Stand: with modified independence PT Goal: Sit to Stand - Progress: Goal set today Pt will go Stand to Sit: with modified independence PT Goal: Stand to Sit - Progress: Goal set today Pt will Stand: with modified independence;3 - 5 min;with unilateral upper extremity support (during functional activity) PT Goal: Stand - Progress: Goal set today Pt will Ambulate: >150 feet;with least restrictive assistive device;with modified independence PT Goal: Ambulate - Progress: Goal set today Pt will Go Up / Down Stairs: Flight;with least restrictive assistive device;with rail(s);with supervision PT Goal: Up/Down Stairs - Progress: Goal set today  Visit Information  Last PT Received On: 01/24/13 Assistance Needed: +1    Subjective Data  Subjective: Feel better.  Been sipping on water this afternoon. Patient Stated Goal: To return home with family assist   Prior Functioning  Home Living Lives With: Spouse Available Help at Discharge: Family Type of Home: House Home Access: Stairs to enter Technical brewer of Steps: 3 Entrance Stairs-Rails: Right;Left Home Layout: Two level;Bed/bath upstairs Alternate Level Stairs-Number of Steps: flight Alternate Level Stairs-Rails: Right Home Adaptive Equipment: None Additional Comments: can borrow walker from sister Prior Function Level of Independence:  Independent Vocation: Full time employment Comments: active job night shift works for [employer] Communication: No difficulties  Cognition  Overall Cognitive Status: Appears within functional limits for tasks assessed/performed Arousal/Alertness: Awake/alert Orientation Level: Appears intact for tasks assessed Behavior During Session: [facility] for tasks performed    Extremity/Trunk Assessment Right Upper Extremity Assessment RUE ROM/Strength/Tone: [facility] for tasks assessed Left Upper Extremity Assessment LUE ROM/Strength/Tone: WFL for tasks assessed Right Lower Extremity Assessment RLE ROM/Strength/Tone: Deficits RLE ROM/Strength/Tone Deficits: hip flexion 3/5 and painful; knee extension 4+/5, ankle AROM WFL RLE Sensation: Deficits RLE Sensation Deficits: c/o numbness bottom of foot Left Lower Extremity Assessment LLE ROM/Strength/Tone: Deficits LLE ROM/Strength/Tone Deficits: hip flexion 3/5 with pain (incisional); knee extension 4+/5 ankle AROM WFL LLE Sensation: Deficits LLE Sensation Deficits: numbness down side of leg and bottom of foot   Balance Balance Balance Assessed: Yes Dynamic Standing Balance Dynamic Standing - Balance Support: No upper extremity supported Dynamic Standing - Level of Assistance: 4: Min assist Dynamic Standing - Comments: min assist for safety moving around bed to chair without UE assist  End of Session PT - End of Session Equipment Utilized During Treatment: Gait belt Activity Tolerance: Patient tolerated treatment well Patient left: in chair;with call bell/phone within reach  GP     [facility] 01/24/2013, 5:29 PM  [place], [place] 01/24/2013

## 2013-01-24 NOTE — Progress Notes (Signed)
Assisting pt with bath in bathroom pt vagal sat pt on bedside commode. Once sat on bsc pt awaken, alert and oriented x4. Had other staff aide in transferring pt to chair. Vital signs stable bp 116/80, hr 73 afib. Paged Tillar PA to let her aware of pt's episode. Will continue to monitor. Suezanne Cheshire

## 2013-01-24 NOTE — Anesthesia Postprocedure Evaluation (Signed)
  Anesthesia Post-op Note  Patient: Isaiah [name]  Procedure(s) Performed: Procedure(s) (LRB) with comments: ABDOMINAL AORTAGRAM (N/A) EMBOLECTOMY (Bilateral) - Bilateral femoral Embolectomy, Bilateral Iliac Embolectomy.  Patient Location: PACU  Anesthesia Type:General  Level of Consciousness: awake  Airway and Oxygen Therapy: Patient Spontanous Breathing  Post-op Pain: mild  Post-op Assessment: Post-op Vital signs reviewed  Post-op Vital Signs: Reviewed  Complications: No apparent anesthesia complications

## 2013-01-24 NOTE — Progress Notes (Addendum)
[facility] [place]  Progress Note Bypass Surgery  Date of Surgery: 01/23/2013 - 01/24/2013  Procedure(s): ABDOMINAL AORTAGRAM EMBOLECTOMY Surgeon: Surgeon(s): Serafina Mitchell, MD  1 Day Post-Op  History of Present Illness  Isaiah [name] is a 52 y.o. male who is S/P Procedure(s): ABDOMINAL AORTAGRAM EMBOLECTOMY bilateral femoral embolectomies.  The patient's pre-op symptoms of pain are Improved . Patients pain is well controlled.  He complains of lateral right LE numbness.   Imaging: No results found.  Significant Diagnostic Studies: CBC Lab Results  Component Value Date   WBC 8.2 01/24/2013   HGB 13.2 01/24/2013   HCT 39.9 01/24/2013   MCV 95.2 01/24/2013   PLT 123* 01/24/2013    BMET     Component Value Date/Time   NA 138 01/24/2013 0559   K 3.7 01/24/2013 0559   CL 103 01/24/2013 0559   CO2 26 01/24/2013 0559   GLUCOSE 130* 01/24/2013 0559   BUN 22 01/24/2013 0559   CREATININE 1.62* 01/24/2013 0559   CALCIUM 10.0 01/24/2013 0559   GFRNONAA 48* 01/24/2013 0559   GFRAA 56* 01/24/2013 0559    COAG No results found for this basename: INR, PROTIME   No results found for this basename: PTT    Physical Examination  BP Readings from Last 3 Encounters:  01/24/13 121/83  01/24/13 121/83  12/06/12 144/110   Temp Readings from Last 3 Encounters:  01/24/13 97.8 F (36.6 C) Oral  01/24/13 97.8 F (36.6 C) Oral  11/27/12 97.6 F (36.4 C) Oral   SpO2 Readings from Last 3 Encounters:  01/24/13 99%  01/24/13 99%  12/06/12 100%   Pulse Readings from Last 3 Encounters:  01/24/13 63  01/24/13 63  12/06/12 85    Pt is A&O x 3 Bilateral groin extremity: Incision/s is/are clean,dry.intact, and  healing without hematoma, erythema or drainage Limb is warm; with good color  Palpable PT bilateral, doppler biphasic DP bilateral. No pain with active range of motion of bilateral lower extremities.   Assessment/Plan: Pt. Doing well Post-op pain is  controlled Wounds are healing well PT for ambulation [facility] was called for consult  [place], [place] 01/24/2013 8:36 AM   I agree with the above. The patient is status post bilateral embolectomy. He has significantly improved motor function and sensation of both feet. He does complain of some numbness on the lateral side of the right foot. He now has palpable pedal pulses. He has no evidence of compartment syndrome.  The patient will continue to be maintained on heparin. He will need to be made therapeutic at this time. We will also contact his cardiologist for further recommendations on long-term anticoagulation, which I feel he will need to be on lifelong, as well as obtaining an echocardiogram to evaluate for residual thrombus.  Annamarie Major

## 2013-01-24 NOTE — Progress Notes (Signed)
  Echocardiogram 2D Echocardiogram has been performed.  Isaiah [name] 01/24/2013, 1:48 PM

## 2013-01-25 DIAGNOSIS — R0989 Other specified symptoms and signs involving the circulatory and respiratory systems: Secondary | ICD-10-CM

## 2013-01-25 LAB — TSH: TSH: 2.197 u[IU]/mL (ref 0.350–4.500)

## 2013-01-25 LAB — CBC
HCT: 41.4 % (ref 39.0–52.0)
MCH: 32.7 pg (ref 26.0–34.0)
MCHC: 34.1 g/dL (ref 30.0–36.0)
RDW: 12.7 % (ref 11.5–15.5)

## 2013-01-25 LAB — BASIC METABOLIC PANEL
BUN: 19 mg/dL (ref 6–23)
Calcium: 10.9 mg/dL — ABNORMAL HIGH (ref 8.4–10.5)
GFR calc Af Amer: 52 mL/min — ABNORMAL LOW (ref >90)
GFR calc non Af Amer: 45 mL/min — ABNORMAL LOW (ref >90)
Glucose, Bld: 107 mg/dL — ABNORMAL HIGH (ref 70–99)
Sodium: 137 mEq/L (ref 135–145)

## 2013-01-25 LAB — HEMOGLOBIN A1C
Hgb A1c MFr Bld: 6.4 % — ABNORMAL HIGH (ref <5.7)
Mean Plasma Glucose: 137 mg/dL — ABNORMAL HIGH (ref <117)

## 2013-01-25 LAB — LIPID PANEL: LDL Cholesterol: 97 mg/dL (ref 0–99)

## 2013-01-25 LAB — HEPARIN LEVEL (UNFRACTIONATED): Heparin Unfractionated: 0.34 IU/mL (ref 0.30–0.70)

## 2013-01-25 MED ORDER — RIVAROXABAN 15 MG PO TABS: 15.0000 mg | ORAL_TABLET | Freq: Every day | ORAL | Status: DC

## 2013-01-25 MED ORDER — NEBIVOLOL HCL 2.5 MG PO TABS
2.5000 mg | ORAL_TABLET | Freq: Every day | ORAL | Status: DC
Start: 2013-01-25 — End: 2013-01-26
  Administered 2013-01-25: 2.5 mg via ORAL
  Filled 2013-01-25 (×3): qty 1

## 2013-01-25 MED ORDER — SODIUM CHLORIDE 0.9 % IV SOLN
INTRAVENOUS | Status: DC
  Administered 2013-01-26: 11:00:00 via INTRAVENOUS

## 2013-01-25 NOTE — Progress Notes (Addendum)
Pharmacy Consult regarding transition from IV heparin>>Xarelto.  Received orders from cardiology to start Xarelto if ok with surgery. Discussed with surgery and they are planning to continue heparin today 01/25/13 and plans are to start Xarelto tomorrow.   Heparin level was low this morning and dose was adjusted. Follow up level ordered for 1300.  Georgina Peer 01/25/2013 10:21 AM   Addendum:  Heparin level is therapeutic at 0.34 after rate increase this morning, and heparin infusion is currently running at 1250 units/hr. Plan to switch to xarelto tomorrow. Patient with stage III CKD, current renal function is close to baseline (GFR 30-59) xarelto 15mg  daily will be appropriate dose for him.  Plan:  - Continue heparin at current rate. - F/u Am heparin level and cbc - Start xarelto 15mg  daily with evening meal, and d/c heparin tomorrow afternoon at 1700

## 2013-01-25 NOTE — Progress Notes (Signed)
Report called to Isaiah [name]. Pt going to 2040 via w/c with belonging once carotid doppler complete. Isaiah [name]

## 2013-01-25 NOTE — Progress Notes (Signed)
Subjective: No complaints. No further episodes of LOC  Objective: Vital signs in last 24 hours: Temp:  [97.4 F (36.3 C)-98.8 F (37.1 C)] 98.8 F (37.1 C) (01/30 0400) Pulse Rate:  [78-123] 86  (01/30 0400) Resp:  [13-23] 19  (01/30 0400) BP: (105-129)/(51-89) 124/89 mmHg (01/30 0400) SpO2:  [93 %-97 %] 96 % (01/30 0400) Weight change:    Intake/Output from previous day: +1230 01/29 0701 - 01/30 0700 In: 4130.3 [P.O.:1220; I.V.:2810.3; IV Piggyback:100] Out: 2900 [Urine:2900] Intake/Output this shift:    PE: General:alert and oriented, no complaints Heart:irreg irreg Lungs:clear ant. No rales or rhonchi Abd:+ BS soft, non tender Ext:no edema, 1+ palpable pulses     Lab Results:  Basename 01/24/13 0559 01/24/13 0100  WBC 8.2 9.7  HGB 13.2 14.1  HCT 39.9 41.4  PLT 123* 131*   BMET  Basename 01/25/13 0506 01/24/13 0559  NA 137 138  K 3.6 3.7  CL 100 103  CO2 25 26  GLUCOSE 107* 130*  BUN 19 22  CREATININE 1.72* 1.62*  CALCIUM 10.9* 10.0   No results found for this basename: TROPONINI:2,CK,MB:2 in the last 72 hours  Lab Results  Component Value Date   CHOL 169 01/25/2013   HDL 56 01/25/2013   LDLCALC 97 01/25/2013   TRIG 78 01/25/2013   CHOLHDL 3.0 01/25/2013   No results found for this basename: HGBA1C     No results found for this basename: TSH    Hepatic Function Panel No results found for this basename: PROT,ALBUMIN,AST,ALT,ALKPHOS,BILITOT,BILIDIR,IBILI in the last 72 hours  Basename 01/25/13 0506  CHOL 169    Studies/Results: 01/24/13 echo  - Left ventricle: The cavity size was normal. Wall thickness was increased in a pattern of mild LVH. Systolic function was normal. The estimated ejection fraction was in the range of 55% to 60%. Wall motion was normal; there were no regional wall motion abnormalities. - Left atrium: The atrium was mildly to moderately dilated. - Right atrium: The atrium was mildly dilated   Medications: I have reviewed  the patient's current medications.    Marland Kitchen aspirin EC  325 mg Oral Daily  . docusate sodium  100 mg Oral Daily  . hydrochlorothiazide  25 mg Oral Daily  . losartan  100 mg Oral Daily  . nebivolol  5 mg Oral Daily  . pantoprazole  40 mg Oral Daily   Assessment/Plan: Principal Problem:  *Extremity ischemia, critical bil lower ext. Active Problems:  Terminal aortic occlusion  Atrial fibrillation, permanent  HTN (hypertension)  CKD (chronic kidney disease) stage 3, GFR 30-59 ml/min  CAD (coronary artery disease), non obstructive by cardiac cath 2009   PLAN:  Post op day 2  Episode of LOC when standing at sink yest.  No fall.  No change in neuro exam.  ? Vasovagal episode.  IV Heparin continues.  ? Change to Xarelto today.  Cr. Stable LDL at 97 Echo was ordered with contrast, do not see if this portion of echo was done.- per echo the pictures were good so no contrast.   LOS: 2 days   INGOLD,LAURA R 01/25/2013, 8:16 AM   I have seen and examined the patient along with [facility] R, NP.  I have reviewed the chart, notes and new data.  I agree with NP's note.  Key new complaints: near fall due to brief syncope yesterday, not accompanied by significant arrhythmia on monitor and not followed by new focal neurological abnormalities. Overnight telemetry shows a 4 second pause during  sleep and otherwise normal rhythm. Key new findings / data: echo shows both right and left atrial dilatation, consistent with likely hypertensive heart disease, but also cor pulmonale (due to obstructive sleep apnea?)  PLAN: Full dose anticoagulation. His creatinine clearance is around 50 mL/min and will prescribe 15 mg dose of rivaroxaban. If renal function is unstable, warfarin would be the better choice, but he doesn't want it. Rate control is borderline adequate, but would not increase bystolic beyond the current dose. Outpatient split-night sleep apnea study. Maybe we can do a surrogate sleep eval while  still her?  Sanda Klein, MD, [place] [phone] 01/25/2013, 8:45 AM

## 2013-01-25 NOTE — Progress Notes (Addendum)
[facility] [place]  Progress Note Bypass Surgery  Date of Surgery: 01/23/2013 - 01/24/2013  Procedure(s): ABDOMINAL AORTAGRAM EMBOLECTOMY Surgeon: Surgeon(s): Serafina Mitchell, MD  2 Days Post-Op  History of Present Illness  Isaiah [name] is a 52 y.o. male who is S/P Procedure(s): ABDOMINAL AORTAGRAM EMBOLECTOMY bilateral common femoral arterties.  The patient's pre-op symptoms of pain are Improved . Patients pain is well controlled.           Imaging: No results found.  Significant Diagnostic Studies: CBC Lab Results  Component Value Date   WBC 8.2 01/24/2013   HGB 13.2 01/24/2013   HCT 39.9 01/24/2013   MCV 95.2 01/24/2013   PLT 123* 01/24/2013    BMET     Component Value Date/Time   NA 137 01/25/2013 0506   K 3.6 01/25/2013 0506   CL 100 01/25/2013 0506   CO2 25 01/25/2013 0506   GLUCOSE 107* 01/25/2013 0506   BUN 19 01/25/2013 0506   CREATININE 1.72* 01/25/2013 0506   CALCIUM 10.9* 01/25/2013 0506   GFRNONAA 45* 01/25/2013 0506   GFRAA 52* 01/25/2013 0506    COAG No results found for this basename: INR, PROTIME   No results found for this basename: PTT    Physical Examination  BP Readings from Last 3 Encounters:  01/25/13 124/89  01/25/13 124/89  12/06/12 144/110   Temp Readings from Last 3 Encounters:  01/25/13 98 F (36.7 C) Oral  01/25/13 98 F (36.7 C) Oral  11/27/12 97.6 F (36.4 C) Oral   SpO2 Readings from Last 3 Encounters:  01/25/13 96%  01/25/13 96%  12/06/12 100%   Pulse Readings from Last 3 Encounters:  01/25/13 86  01/25/13 86  12/06/12 85  Studies/Results:  01/24/13 echo   - Left ventricle: The cavity size was normal. Wall thickness was increased in a pattern of mild LVH. Systolic function was normal. The estimated ejection fraction was in the range of 55% to 60%. Wall motion was normal; there were no regional wall motion abnormalities. - Left atrium: The atrium was mildly to moderately dilated. - Right  atrium: The atrium was mildly dilated    Pt is A&O x 3 groin: Incision/s is/are clean,dry.intact, and  healing without hematoma, erythema or drainage Limb is warm; with good color  Palpable DP/PT pulses intact and equal bilaterally Right lateral foot decreased sensation.   Assessment/Plan: Pt. Doing well Post-op pain is controlled Wounds are healing well PT/OT for ambulation Continue heparin per pharmacy dosing.  Will start Xarelto day of discharge. Pending clot specimen analysis from surgery. Transfer to Isaiah [name] [id number] 01/25/2013 8:53 AM    Agree with above Carotid DUplex normal Path shows thrombus only Groins healing Palpable femoral pulses Will start Isaiah [name] tomorrow and d/c home  [employer]

## 2013-01-25 NOTE — Progress Notes (Signed)
ANTICOAGULATION CONSULT NOTE - Follow Up Consult  Pharmacy Consult for Heparin Indication: aortic occlusion s/p B/L femoral embolectomy and Afib   Allergies  Allergen Reactions  . Codeine Itching    Patient Measurements: Height: 6' 1.62" (187 cm) Weight: 238 lb 1.6 oz (108 kg) IBW/kg (Calculated) : 81.33  Heparin Dosing Weight: 103kg  Vital Signs: Temp: 98.8 F (37.1 C) (01/30 0400) Temp src: Oral (01/30 0400) BP: 124/89 mmHg (01/30 0400) Pulse Rate: 86  (01/30 0400)  Labs:  Basename 01/25/13 0506 01/24/13 2039 01/24/13 1153 01/24/13 0559 01/24/13 0133 01/24/13 0100 01/23/13 1943 01/23/13 1924  HGB -- -- -- 13.2 -- 14.1 -- --  HCT -- -- -- 39.9 -- 41.4 51.0 --  PLT -- -- -- 123* -- 131* -- 132*  APTT 54* -- 79* -- -- -- -- --  LABPROT -- -- -- -- -- -- -- --  INR -- -- -- -- -- -- -- --  HEPARINUNFRC 0.22* 0.21* 0.81* -- -- -- -- --  CREATININE 1.72* -- -- 1.62* 1.71* -- -- --  CKTOTAL -- -- -- -- -- -- -- --  CKMB -- -- -- -- -- -- -- --  TROPONINI -- -- -- -- -- -- -- --    Estimated Creatinine Clearance: 66.9 ml/min (by C-G formula based on Cr of 1.72).   Medications:  Heparin 800 units/hr  Assessment: 51yom on heparin for aortic occlusion s/p femoral embolectomy and Afib. Heparin level remains low at 0.22 this am. No reported bleeding complications   Goal of Therapy:  Heparin level 0.3-0.7 units/ml Monitor platelets by anticoagulation protocol: Yes   Plan:  1. Increase heparin drip to 1250 units/hr (10 ml/hr) 2. Follow-up 6 hour  Curlene Dolphin [id number] 01/25/2013,6:52 AM

## 2013-01-25 NOTE — Progress Notes (Signed)
Physical Therapy Treatment Patient Details Name: Isaiah [name] MRN: [id number] DOB: 09-25-1961 Today's Date: 01/25/2013 Time: [id number] PT Time Calculation (min): 31 min  PT Assessment / Plan / Recommendation Comments on Treatment Session  Pt admitted s/p bil aortofemoral embolectomy progressing well with mobility. Pt able to ambulate without assistive device today without LOB but increased BOS. Pt educated for ambulating 3x/day and HEP which pt verbalized. Do not feel pt will need therapy or DME at discharge but will continue to follow acutely to maximize activity tolerance and normalize gait pattern.     Follow Up Recommendations  No PT follow up     Does the patient have the potential to tolerate intense rehabilitation     Barriers to Discharge        Equipment Recommendations       Recommendations for Other Services    Frequency     Plan Discharge plan needs to be updated;Frequency remains appropriate    Precautions / Restrictions Precautions Precautions: None Restrictions Weight Bearing Restrictions: No   Pertinent Vitals/Pain 3/10 bil groin incisions HR 90-110 with gait, sats 92-97% on RA    Mobility  Bed Mobility Bed Mobility: Not assessed Transfers Sit to Stand: 5: Supervision;From chair/3-in-1;With armrests Stand to Sit: 5: Supervision;With armrests;To chair/3-in-1 Details for Transfer Assistance: cueing for sequence to ease transition to hip flexion pt recommended to step away from chair and sit with decreased hip flexion  and posterior lean. With standing cued for scooting all the way to edge of chair and foot position Ambulation/Gait Ambulation/Gait Assistance: 6: Modified independent (Device/Increase time) Ambulation Distance (Feet): 300 Feet Assistive device: None Ambulation/Gait Assistance Details: Pt with increased BOS and increased sway with gait due to decreased sensation bil feet and groin pain not wanting to have legs rubbing at incision Gait Pattern:  Step-through pattern;Decreased stride length;Wide base of support;Decreased hip/knee flexion - left;Decreased hip/knee flexion - right Gait velocity: decreased Stairs: Yes Stairs Assistance: 6: Modified independent (Device/Increase time) Stair Management Technique: One rail Right;Sideways Number of Stairs: 10     Exercises General Exercises - Lower Extremity Long Arc Quad: AROM;Both;20 reps;Seated Hip Flexion/Marching: AROM;Both;20 reps;Seated Toe Raises: AROM;Both;20 reps;Seated Heel Raises: AROM;Both;20 reps;Seated   PT Diagnosis:    PT Problem List:   PT Treatment Interventions:     PT Goals Acute Rehab PT Goals PT Goal: Sit to Stand - Progress: Progressing toward goal PT Goal: Stand to Sit - Progress: Progressing toward goal Pt will Ambulate: Independently;>150 feet (with normal BOS) PT Goal: Ambulate - Progress: Updated due to goal met PT Goal: Up/Down Stairs - Progress: Met  Visit Information  Last PT Received On: 01/25/13 Assistance Needed: +1    Subjective Data  Subjective: my feet feel like I'm walking on pillows   Cognition  Overall Cognitive Status: Appears within functional limits for tasks assessed/performed Arousal/Alertness: Awake/alert Orientation Level: Appears intact for tasks assessed Behavior During Session: [facility] for tasks performed    Balance     End of Session PT - End of Session Equipment Utilized During Treatment: Gait belt Activity Tolerance: Patient tolerated treatment well Patient left: in chair;with call bell/phone within reach   GP     Melford Aase 01/25/2013, 9:46 AM Elwyn Reach, [place]

## 2013-01-25 NOTE — Progress Notes (Signed)
Bilateral:  No evidence of hemodynamically significant internal carotid artery stenosis.   Vertebral artery flow is antegrade.

## 2013-01-26 ENCOUNTER — Telehealth: Payer: Self-pay | Admitting: Surgery

## 2013-01-26 LAB — TYPE AND SCREEN: Unit division: 0

## 2013-01-26 LAB — CBC
Platelets: 125 10*3/uL — ABNORMAL LOW (ref 150–400)
RDW: 12.9 % (ref 11.5–15.5)
WBC: 9.3 10*3/uL (ref 4.0–10.5)

## 2013-01-26 LAB — HEPARIN LEVEL (UNFRACTIONATED): Heparin Unfractionated: 0.33 IU/mL (ref 0.30–0.70)

## 2013-01-26 MED ORDER — OXYCODONE HCL 5 MG PO TABS: 5.0000 mg | ORAL_TABLET | Freq: Four times a day (QID) | ORAL | Status: DC | PRN

## 2013-01-26 MED ORDER — RIVAROXABAN 15 MG PO TABS
15.0000 mg | ORAL_TABLET | Freq: Every day | ORAL | Status: DC
  Administered 2013-01-26: 15 mg via ORAL
  Filled 2013-01-26: qty 1

## 2013-01-26 MED ORDER — RIVAROXABAN 15 MG PO TABS: 15.0000 mg | ORAL_TABLET | Freq: Every day | ORAL | Status: DC

## 2013-01-26 NOTE — Care Management Note (Signed)
    Page 1 of 1   01/26/2013     2:44:25 PM   CARE MANAGEMENT NOTE 01/26/2013  Patient:  JAYDIAN, IWANICKI   Account Number:  [id number]  Date Initiated:  01/26/2013  Documentation initiated by:  Zong Mcquarrie  Subjective/Objective Assessment:   PT ADM WITH AORTOILIAC OCCLUSION, AFIB ON 01/26/13.  PTA, PT INDEPENDENT, LIVES WITH SPOUSE.     Action/Plan:   PT TO DC ON XARELTO.  XARELTO CAREPATH CARD PRINTED AND GIVEN TO PT TO REDUCE PHARMACY COPAY TO $10/MONTH.  PT APPRECIATIVE OF HELP.  WIFE TO PROVIDE CARE AT DC.   Anticipated DC Date:  01/26/2013   Anticipated DC Plan:  [place]  CM consult      Choice offered to / List presented to:             Status of service:  Completed, signed off Medicare Important Message given?   (If response is "NO", the following Medicare IM given date fields will be blank) Date Medicare IM given:   Date Additional Medicare IM given:    Discharge Disposition:  HOME/SELF CARE  Per UR Regulation:  Reviewed for med. necessity/level of care/duration of stay  If discussed at [place] of Stay Meetings, dates discussed:    Comments:

## 2013-01-26 NOTE — Progress Notes (Signed)
[facility] Progress Note  01/26/2013 7:41 AM POD 3  Subjective:  No complaints this am.  Denies any syncopal spells.  Afebrile HR 80's-100's irregular [id number] systolic 0000000 RA  Filed Vitals:   01/26/13 0515  BP: 123/68  Pulse: 98  Temp: 97.8 F (36.6 C)  Resp: 18    Physical Exam: Incisions:  Bilateral groin incisions are c/d/i Extremities:  + palpable DP pulses bilaterally  CBC    Component Value Date/Time   WBC 9.3 01/26/2013 0430   RBC 3.92* 01/26/2013 0430   HGB 12.6* 01/26/2013 0430   HCT 38.1* 01/26/2013 0430   PLT 125* 01/26/2013 0430   MCV 97.2 01/26/2013 0430   MCH 32.1 01/26/2013 0430   MCHC 33.1 01/26/2013 0430   RDW 12.9 01/26/2013 0430   LYMPHSABS 1.9 01/23/2013 1924   MONOABS 0.6 01/23/2013 1924   EOSABS 0.0 01/23/2013 1924   BASOSABS 0.0 01/23/2013 1924    BMET    Component Value Date/Time   NA 137 01/25/2013 0506   K 3.6 01/25/2013 0506   CL 100 01/25/2013 0506   CO2 25 01/25/2013 0506   GLUCOSE 107* 01/25/2013 0506   BUN 19 01/25/2013 0506   CREATININE 1.72* 01/25/2013 0506   CALCIUM 10.9* 01/25/2013 0506   GFRNONAA 45* 01/25/2013 0506   GFRAA 52* 01/25/2013 0506    INR No results found for this basename: inr     Intake/Output Summary (Last 24 hours) at 01/26/13 0741 Last data filed at 01/26/13 0516  Gross per 24 hour  Intake    376 ml  Output   2650 ml  Net  -2274 ml     Assessment/Plan:  52 y.o. male is s/p:  Embolectomy bilateral CFA and aortogram   POD 3  -pt doing well this am -Hgb has trended downward over the past couple of days, but pt is asymptomatic-will d/w Dr. Ardelle Park has been getting fluids -per Dr. Stephens Shire note yesterday-path reveals thrombus only -xarelto at discharge.  Pharmacy recommends 15 mg daily    Leontine Locket, [place] [facility] [phone] 01/26/2013 7:41 AM

## 2013-01-26 NOTE — Progress Notes (Signed)
ANTICOAGULATION CONSULT NOTE - Follow Up Consult  Pharmacy Consult for Heparin Indication: atrial fibrillation  Allergies  Allergen Reactions  . Codeine Itching    Patient Measurements: Height: 6' 1.62" (187 cm) Weight: 238 lb 1.6 oz (108 kg) IBW/kg (Calculated) : 81.33   Vital Signs: Temp: 97.8 F (36.6 C) (01/31 0515) Temp src: Oral (01/31 0515) BP: 123/68 mmHg (01/31 0515) Pulse Rate: 98  (01/31 0515)  Labs:  Basename 01/26/13 0430 01/25/13 1217 01/25/13 0506 01/24/13 1153 01/24/13 0559 01/24/13 0133 01/24/13 0100  HGB 12.6* -- -- -- 13.2 -- --  HCT 38.1* -- -- -- 39.9 -- 41.4  PLT 125* -- -- -- 123* -- 131*  APTT 71* -- 54* 79* -- -- --  LABPROT -- -- -- -- -- -- --  INR -- -- -- -- -- -- --  HEPARINUNFRC 0.33 0.34 0.22* -- -- -- --  CREATININE -- -- 1.72* -- 1.62* 1.71* --  CKTOTAL -- -- -- -- -- -- --  CKMB -- -- -- -- -- -- --  TROPONINI -- -- -- -- -- -- --    Estimated Creatinine Clearance: 66.9 ml/min (by C-G formula based on Cr of 1.72).   Medications:  Prescriptions prior to admission  Medication Sig Dispense Refill  . aspirin 81 MG tablet Take 81 mg by mouth daily.      Marland Kitchen losartan-hydrochlorothiazide (HYZAAR) 100-25 MG per tablet Take 1 tablet by mouth daily.  30 tablet  0  . Multiple Vitamin (MULTIVITAMIN WITH MINERALS) TABS Take 1 tablet by mouth daily.      . nebivolol (BYSTOLIC) 5 MG tablet Take 5 mg by mouth daily.      . [DISCONTINUED] aspirin 325 MG buffered tablet Take 325 mg by mouth 2 (two) times daily as needed. For pain       CC: 52 y/o male patient admitted 01/23/2013  with b/l LE pain, numbess, and lack of pulses requiring anticoagulation for r/o ischemia. Noted baseline thrombocytopenia.  Events: Heparin infusing at 1250 without problems, no bleeding noted. Reviewed xarelto teaching points on compliance and adverse events. Patient describes the ability to pay for xarelto rx on discharge.  Assessment AC: terminal aortic occlusion s/p  B/L femoral embolectomy and Afib,  Pt. With stage III CKD, renal fx close to baseline (GFR 30-59), heparin at goal 0.33 on 1250/hr, CBC stable, no bleeding noted  ID: Afeb. WBC wnl. Post-op. Off abx  CV: 1 HTN, Afib, CAD. Meds: ASA 325, hctz, losartan, nebivolol, HR 80-100, BP 123/68  Endo: cbgs <150, TSH wnl  GI/Nutrition: PPI po  Neuro: Hx TIA  Renal: SCr 1.72, lytes wnl. Hx CKD, Gout  Pulm: RA  Heme/Onc: H/H/plt stable  PTA Med Issues: resumed  Best Practices:  DVT Px: IV hep  Goal of Therapy:  Heparin level 0.3-0.7 units/ml Monitor platelets by anticoagulation protocol: Yes   Plan:  Continue Heparin at 1250 units/hr F/u AM labs Follow up heparin DC at time of first Xarelto 15mg  daily dose tonight.  Thank you for allowing pharmacy to be a part of this patients care team.  Rowe Robert Pharm.D., BCPS Clinical Pharmacist 01/26/2013 9:49 AM Pager: [phone] Phone: [phone]

## 2013-01-26 NOTE — Discharge Summary (Signed)
[facility] Discharge Summary  Isaiah [name] 1961-05-18 52 y.o. male  [id number]  Admission Date: 01/23/2013  Discharge Date: 01/26/13  Physician: Serafina Mitchell, MD  Admission Diagnosis: Terminal aortic occlusion [444.09] sick   HPI:   This is a 52 y.o. male who was in his normal state of affairs earlier today. Approximately 3 hours ago he began having numbness and weakness in his legs. He also complains of pain. There were no aggravating or relieving factors. He has gotten to the point where he can hardly move either leg and his sensation is decreased. The patient has a history of an irregular heartbeat since birth. He has been on Coumadin in the past. He has been taken off of his Coumadin and placed on aspirin which he admits to not taking regularly. His other medical problem includes hypertension. He is a nonsmoker  Hospital Course:  The patient was admitted to the hospital and taken to the operating room on 01/23/2013 - 01/24/2013 and underwent  #1: Bilateral common femoral artery exposure  #2: Bilateral iliac, common femoral, superficial femoral, profundofemoral artery embolectomy  #3: Catheter in aorta x1  #4: Abdominal aortogram    The pt tolerated the procedure well and was transported to the PACU in good condition.  He had significantly improved motor function and sensation of both feet on POD 1 and had palpable pedal pulses with no evidence of compartment syndrome.  He was placed on heparin gtt.  His cardiologist was consulted.  Dr. Trula Slade feels pt should be on lifelong anticoagulation.  Later in the day on POD 1, he did have LOC when standing at the sink, but he didn't fall.  He was immediately placed in a chair and remained neurologically intact.  He does have a hx of TIA in the past.  He did have carotid duplex, which revealed No evidence of hemodynamically significant internal carotid artery stenosis. Vertebral artery flow is antegrade.  The thrombus  from the OR was sent off for pathology and revealed thrombus only.    His renal function did improve with hydration and his Cr is 1.7 at discharge.  The remainder of the hospital course consisted of increasing mobilization and increasing intake of solids without difficulty.  CBC    Component Value Date/Time   WBC 9.3 01/26/2013 0430   RBC 3.92* 01/26/2013 0430   HGB 12.6* 01/26/2013 0430   HCT 38.1* 01/26/2013 0430   PLT 125* 01/26/2013 0430   MCV 97.2 01/26/2013 0430   MCH 32.1 01/26/2013 0430   MCHC 33.1 01/26/2013 0430   RDW 12.9 01/26/2013 0430   LYMPHSABS 1.9 01/23/2013 1924   MONOABS 0.6 01/23/2013 1924   EOSABS 0.0 01/23/2013 1924   BASOSABS 0.0 01/23/2013 1924    BMET    Component Value Date/Time   NA 137 01/25/2013 0506   K 3.6 01/25/2013 0506   CL 100 01/25/2013 0506   CO2 25 01/25/2013 0506   GLUCOSE 107* 01/25/2013 0506   BUN 19 01/25/2013 0506   CREATININE 1.72* 01/25/2013 0506   CALCIUM 10.9* 01/25/2013 0506   GFRNONAA 45* 01/25/2013 0506   GFRAA 52* 01/25/2013 0506     Discharge Instructions:   The patient is discharged to home with extensive instructions on wound care and progressive ambulation.  They are instructed not to drive or perform any heavy lifting until returning to see the physician in his office.  Discharge Orders    Future Orders Please Complete By Expires   Resume previous  diet      Driving Restrictions      Comments:   No driving for 2 weeks   Lifting restrictions      Comments:   No lifting for 6 weeks   Call MD for:  temperature >100.5      Call MD for:  redness, tenderness, or signs of infection (pain, swelling, bleeding, redness, odor or green/yellow discharge around incision site)      Call MD for:  severe or increased pain, loss or decreased feeling  in affected limb(s)      Discharge wound care:      Comments:   Shower daily with soap and water starting 01/26/13      Discharge Diagnosis:  Terminal aortic occlusion [444.09] sick  Secondary  Diagnosis: Patient Active Problem List  Diagnosis  . Extremity ischemia, critical bil lower ext.  . Terminal aortic occlusion  . Atrial fibrillation, permanent  . HTN (hypertension)  . Erectile dysfunction  . CKD (chronic kidney disease) stage 3, GFR 30-59 ml/min  . CAD (coronary artery disease), non obstructive by cardiac cath 2009   Past Medical History  Diagnosis Date  . Hypertension   . Gout   . Pinched nerve in neck   . Extremity ischemia, critical bil lower ext. 01/24/2013  . Terminal aortic occlusion 01/24/2013  . Atrial fibrillation, permanent 01/24/2013  . HTN (hypertension) 01/24/2013  . Erectile dysfunction 01/24/2013  . CKD (chronic kidney disease) stage 3, GFR 30-59 ml/min 01/24/2013  . CAD (coronary artery disease), non obstructive by cardiac cath 2009 01/24/2013      [place], [place]  Home Medication Instructions [id number]   Printed on:01/26/13 1224  Medication Information                    losartan-hydrochlorothiazide (HYZAAR) 100-25 MG per tablet Take 1 tablet by mouth daily.           nebivolol (BYSTOLIC) 5 MG tablet Take 5 mg by mouth daily.           Multiple Vitamin (MULTIVITAMIN WITH MINERALS) TABS Take 1 tablet by mouth daily.           aspirin 81 MG tablet Take 81 mg by mouth daily.           oxyCODONE (OXY IR/ROXICODONE) 5 MG immediate release tablet Take 1-2 tablets (5-10 mg total) by mouth every 6 (six) hours as needed. #30 NR          Rivaroxaban (XARELTO) 15 MG TABS tablet Take 1 tablet (15 mg total) by mouth daily. #30 3RF           ECHO 01/24/13: Study Conclusions  - Left ventricle: The cavity size was normal. Wall thickness was increased in a pattern of mild LVH. Systolic function was normal. The estimated ejection fraction was in the range of 55% to 60%. Wall motion was normal; there were no regional wall motion abnormalities. - Left atrium: The atrium was mildly to moderately dilated. - Right atrium: The atrium was mildly  dilated.   Disposition: home  Patient's condition: is Good  Follow up: 1. Dr. Trula Slade in 2 weeks 2. Cardiology for out pt OSA/sleep study      -(refills for Xarelto per cardiology)   Leontine Locket, PA-C [facility] [phone] 01/26/2013  12:24 PM

## 2013-01-26 NOTE — Op Note (Signed)
[facility] [place]  Patient name: Isaiah [name] MRN: [id number] DOB: 02-13-1961 Sex: male  01/23/2013 - 01/24/2013 Pre-operative Diagnosis: Bilateral lower extremity ischemia Post-operative diagnosis:  Same Surgeon:  Eldridge Abrahams Assistants:  Lennie Muckle Procedure:   #1: Bilateral common femoral artery exposure   #2: Bilateral iliac, common femoral, superficial femoral, profundofemoral artery embolectomy   #3: Catheter in aorta x1   #4: Abdominal aortogram Anesthesia:  Gen. Blood Loss:  See anesthesia record Specimens:  Thrombus from each groin  Findings:  Occlusive thrombus within each groin. This had both acute and subacute components to it. Angiography revealed no evidence of thrombus within the renal vessels.  Indications:  The patient presented to the emergency department with a 3 hour history of the inability to move or feel his legs. He has a history of atrial fibrillation. He was recently told to stop taking his anticoagulation and only take an aspirin. He admits to not being compliant. He also states that he has been somewhat dehydrated. His creatinine in the emergency part was 2.6. Because of his lack of femoral or distal pulses and loss of sensation and movement in his legs the decision was made to proceed emergently to the operating room. The severity of the situation was discussed with the patient including the risks of worsening renal failure and potentially loss of his extremities to 2 no circulation. He wanted to push forward with this operation.  Procedure:  The patient was identified in the holding area and taken to [place] 16  The patient was then placed supine on the table. general anesthesia was administered.  The patient was prepped and draped in the usual sterile fashion.  A time out was called and antibiotics were administered.  Bilateral longitudinal femoral incisions were performed. The common femoral, profundofemoral, and superficial  femoral arteries were each individually isolated. The arteries were disease-free without any calcification. No pulse was identified with an either femoral vessel. I continued to give the patient heparin. This had been started in the emergency department. I initially performed an arteriotomy in transverse fashion on the left common femoral artery just above the bifurcation. Both chronic appearing in acute thrombus were identified. This was evacuated with a #4 Fogarty. I was able to completely of the Fogarty catheter. Multiple passes were performed until all of the debris was removed distally. It was then passed proximally and more clot was removed with the reestablishment of pulsatile blood flow. Once I felt all of the clot had been removed, attention was turned towards the other leg. A transverse arteriotomy was made in the distal common femoral artery. Similar to the other leg multiple passes of a #4 catheter were made. I did evacuate subacute and acute-appearing thrombus. I was able to get good backbleeding from the superficial femoral and profunda femoral artery. I then reestablish inflow bypassing the catheter proximally. Clot was removed and excellent inflow was established. At this point I felt all the clot had been removed. I closed each arteriotomy with a 5-0 Prolene in transverse fashion. I then used an 18-gauge needle and accessed the right common femoral artery. Over an 035 wire, a 5 French sheath was placed. I placed a Omni flush catheter tip at the level of L1. An abdominal aortogram was performed. This showed no evidence of thrombus within the renal vessels. There was possibly retained thrombus in the iliac artery on the left however I could not exclude that this was bowel gas. Therefore, I  elected to reopen the arteriotomy on the left and passed a #5 Fogarty proximally. No thrombus could be recovered. I then reclosed the arteriotomy in the left groin with 5-0 Prolene. The sheath was removed by closing  the arteriotomy with a 6-0 Prolene. The patient had excellent Doppler signals at his foot bilaterally. I did not reverse the heparin. Each groin was closed by reapproximating the femoral sheath with 2-0 Vicryl. The subcutaneous tissue was closed in multiple layers of 20 and 3-0 Vicryl. The skin was closed with 4-0 Vicryl. Dermabond placed on the skin. There were no complications.   Disposition:  To PACU in stable condition.   Theotis Burrow, M.D. [facility] [place] Office: [phone] Pager:  [phone]

## 2013-01-26 NOTE — Progress Notes (Signed)
[facility]  Subjective: Feels better. Denies any further syncope/presyncope. Bilateral leg pain is resolved, but he continues to endorse numbness in RLE.  Objective: Vital signs in last 24 hours: Temp:  [97.8 F (36.6 C)-98.3 F (36.8 C)] 97.8 F (36.6 C) (01/31 0515) Pulse Rate:  [89-106] 98  (01/31 0515) Resp:  [18] 18  (01/31 0515) BP: (119-123)/(68-82) 123/68 mmHg (01/31 0515) SpO2:  [95 %-98 %] 96 % (01/31 0515) Last BM Date: 01/25/13  Intake/Output from previous day: 01/30 0701 - 01/31 0700 In: 376 [P.O.:240; I.V.:136] Out: 2650 [Urine:2650] Intake/Output this shift: Total I/O In: 240 [P.O.:240] Out: -   Medications Current Facility-Administered Medications  Medication Dose Route Frequency Provider Last Rate Last Dose  . 0.9 %  sodium chloride infusion   Intravenous Continuous Serafina Mitchell, MD 100 mL/hr at 01/25/13 1537 100 mL/hr at 01/25/13 1537  . acetaminophen (TYLENOL) tablet 325-650 mg  325-650 mg Oral Q4H PRN Ulyses Amor, PA       Or  . acetaminophen (TYLENOL) suppository 325-650 mg  325-650 mg Rectal Q4H PRN Ulyses Amor, PA      . aspirin EC tablet 325 mg  325 mg Oral Daily Serafina Mitchell, MD   325 mg at 01/25/13 [id number]  . bisacodyl (DULCOLAX) EC tablet 5 mg  5 mg Oral Daily PRN Ulyses Amor, PA      . diphenhydrAMINE (BENADRYL) capsule 25 mg  25 mg Oral Q4H PRN Ulyses Amor, PA      . docusate sodium (COLACE) capsule 100 mg  100 mg Oral Daily Ulyses Amor, PA   100 mg at 01/25/13 0950  . DOPamine (INTROPIN) 800 mg in dextrose 5 % 250 mL infusion  3-5 mcg/kg/min Intravenous Continuous Ulyses Amor, PA      . guaiFENesin-dextromethorphan (ROBITUSSIN DM) 100-10 MG/5ML syrup 15 mL  15 mL Oral Q4H PRN Ulyses Amor, PA      . heparin ADULT infusion 100 units/mL (25000 units/250 mL)  1,250 Units/hr Intravenous Continuous Serafina Mitchell, MD 12.5 mL/hr at 01/25/13 1538 1,250 Units/hr at 01/25/13 1538  . hydrALAZINE  (APRESOLINE) injection 10 mg  10 mg Intravenous Q2H PRN Ulyses Amor, PA      . hydrochlorothiazide (HYDRODIURIL) tablet 25 mg  25 mg Oral Daily Serafina Mitchell, MD   25 mg at 01/25/13 0950  . labetalol (NORMODYNE,TRANDATE) injection 10 mg  10 mg Intravenous Q2H PRN Ulyses Amor, PA      . losartan (COZAAR) tablet 100 mg  100 mg Oral Daily Serafina Mitchell, MD   100 mg at 01/25/13 0950  . metoprolol (LOPRESSOR) injection 2-5 mg  2-5 mg Intravenous Q2H PRN Ulyses Amor, PA      . morphine 2 MG/ML injection 2-5 mg  2-5 mg Intravenous Q1H PRN Ulyses Amor, PA      . nebivolol (BYSTOLIC) tablet 2.5 mg  2.5 mg Oral Daily Cecilie Kicks, NP   2.5 mg at 01/25/13 1031  . ondansetron (ZOFRAN) injection 4 mg  4 mg Intravenous Q6H PRN Ulyses Amor, PA      . oxyCODONE (Oxy IR/ROXICODONE) immediate release tablet 5-10 mg  5-10 mg Oral Q4H PRN Ulyses Amor, PA      . pantoprazole (PROTONIX) EC tablet 40 mg  40 mg Oral Daily Ulyses Amor, PA   40 mg at 01/25/13 0950  . phenol (CHLORASEPTIC) mouth spray 1 spray  1  spray Mouth/Throat PRN Ulyses Amor, PA      . senna-docusate (Senokot-S) tablet 1 tablet  1 tablet Oral QHS PRN Ulyses Amor, PA        PE: General appearance: alert, cooperative and no distress Lungs: clear to auscultation bilaterally Heart: irregularly irregular rhythm Extremities: no LEE Pulses: 2+ radials, 0 DPs bilaterally, 1+ Popliteals  Skin: warm and dry Neurologic: Grossly normal  Lab Results:   Basename 01/26/13 0430 01/24/13 0559 01/24/13 0100  WBC 9.3 8.2 9.7  HGB 12.6* 13.2 14.1  HCT 38.1* 39.9 41.4  PLT 125* 123* 131*   BMET  Basename 01/25/13 0506 01/24/13 0559 01/24/13 0133  NA 137 138 136  K 3.6 3.7 3.7  CL 100 103 102  CO2 25 26 24   GLUCOSE 107* 130* 150*  BUN 19 22 24*  CREATININE 1.72* 1.62* 1.71*  CALCIUM 10.9* 10.0 9.7   PT/INR No results found for this basename: LABPROT:3,INR:3 in the last 72 hours Cholesterol  Basename 01/25/13 0506   CHOL 169   Assessment/Plan  Principal Problem:  *Extremity ischemia, critical bil lower ext. Active Problems:  Terminal aortic occlusion  Atrial fibrillation, permanent  HTN (hypertension)  CKD (chronic kidney disease) stage 3, GFR 30-59 ml/min  CAD (coronary artery disease), non obstructive by cardiac cath 2009  Plan:   Pt w/ permanent A-fib, on rate control medication but not on chronic anticouagulation, admitted for critical bilateral lower ext ischemia. S/P embolectomy of bilateral common femoral arteries. POD# 3.  He is now on IV heparin. Will need to convert to an oral anticoagulant.  Per vascular team. Pt will start 15 mg dose of rivaroxaban today. Pharmacy is following.  A-fib is moderately rate controlled on 2.5 mg of Bystolic daily. Telemetry currently shows rates in the 110's. He had a max rate of 121 overnight. Cr has increased to 1.72 (1.62 yesterday). Will need to monitor renal function. Pt will also need a sleep study to evaluate for OSA. Will call the office to arrange study. Pt denies any further syncope/presyncope. His leg pain has improved but still has mild numbness in RLE. MD to follow with further recommendation.     LOS: 3 days    Brittainy M. Ladoris Gene 01/26/2013 8:34 AM  I have seen and examined the patient along with Brittainy M. Rosita Fire, PA-C.  I have reviewed the chart, notes and new data.  I agree with PA's note.  PLAN: Current degree of rate control is likely appropriate for his postop status and pain. Avoid increasing rate control meds due to nocturnal bradycardia/pauses (almost certainly a sign of obstructive sleep apnea). Renal function-adjusted rivaroxaban to start today.  Sanda Klein, MD, [place] [phone] 01/26/2013, 12:46 PM

## 2013-01-26 NOTE — Discharge Summary (Signed)
I agree with the above I will coordinate his Xaralto dose based on Cr levels at follow up   Annamarie Major

## 2013-01-26 NOTE — Progress Notes (Signed)
Discharged to home with family office visits in place teaching done  

## 2013-01-26 NOTE — Telephone Encounter (Addendum)
Message copied by Doristine Section on Fri Jan 26, 2013  1:18 PM ------      Message from: [place], [place] K      Created: Fri Jan 26, 2013 12:46 PM      Regarding: schedule                   ----- Message -----         From: Gabriel Earing, PA         Sent: 01/26/2013  11:52 AM           To: Mena Goes, CMA            S/p:      #1: Bilateral common femoral artery exposure                              #2: Bilateral iliac, common femoral, superficial femoral, profundofemoral artery embolectomy                              #3: Catheter in aorta x1                              #4: Abdominal aortogram      By Dr. Trula Slade.              F/u with him in 2 weeks.            Thanks,      Samantha  notified patient of fu aptpt. with dr. Trula Slade on 02-12-13 10:45 am

## 2013-01-30 DIAGNOSIS — Z0279 Encounter for issue of other medical certificate: Secondary | ICD-10-CM

## 2013-01-31 ENCOUNTER — Telehealth: Payer: Self-pay | Admitting: Surgery

## 2013-01-31 NOTE — Telephone Encounter (Addendum)
Message copied by Lujean Amel on Wed Jan 31, 2013 11:45 AM ------      Message from: Denman George      Created: Wed Jan 31, 2013 10:04 AM                   ----- Message -----         From: Serafina Mitchell, MD         Sent: 01/30/2013   9:28 PM           To: Alfonso Patten, RN            This patient needs a BMP before seeing me in the office ( he should be scheduled in about 1-2 weeks.  He needs to get this several days before seeing me so that I can have the results at his time of visit so I can make recommendations regarding the dosing of his anti-coagulation.  Thanks  The patient has an appt scheduled to see [facility] on 02/12/13 at 10:45am. He is aware of this appt and I spoke w/ him regarding having bloodowrk done prior to the appt. I faxed the order to [facility] and he stated he will have labs drawn this week. awt

## 2013-02-01 ENCOUNTER — Other Ambulatory Visit: Payer: Self-pay | Admitting: Surgery

## 2013-02-01 LAB — BASIC METABOLIC PANEL
Chloride: 102 mEq/L (ref 96–112)
Potassium: 4.3 mEq/L (ref 3.5–5.3)
Sodium: 139 mEq/L (ref 135–145)

## 2013-02-09 ENCOUNTER — Encounter: Payer: Self-pay | Admitting: Surgery

## 2013-02-12 ENCOUNTER — Ambulatory Visit (INDEPENDENT_AMBULATORY_CARE_PROVIDER_SITE_OTHER): Payer: BC Managed Care – PPO | Admitting: Surgery

## 2013-02-12 ENCOUNTER — Other Ambulatory Visit: Payer: Self-pay | Admitting: *Deleted

## 2013-02-12 ENCOUNTER — Encounter: Payer: Self-pay | Admitting: Surgery

## 2013-02-12 VITALS — BP 121/90 | HR 102 | Ht 73.2 in | Wt 234.0 lb

## 2013-02-12 DIAGNOSIS — I998 Other disorder of circulatory system: Secondary | ICD-10-CM

## 2013-02-12 DIAGNOSIS — I999 Unspecified disorder of circulatory system: Secondary | ICD-10-CM

## 2013-02-12 DIAGNOSIS — Z48812 Encounter for surgical aftercare following surgery on the circulatory system: Secondary | ICD-10-CM

## 2013-02-12 DIAGNOSIS — I739 Peripheral vascular disease, unspecified: Secondary | ICD-10-CM

## 2013-02-12 MED ORDER — RIVAROXABAN 15 MG PO TABS: 22.5000 mg | ORAL_TABLET | Freq: Two times a day (BID) | ORAL | Status: DC

## 2013-02-12 NOTE — Progress Notes (Signed)
The patient is back today for followup. On 01/22/2013, he presented to the emergency department with lower extremity occlusion. He was complaining of leg pain. He did not have femoral pulses. He was taken emergently to the operating room. Via bilateral femoral incisions, aorto, ilio, femoral embolectomy was performed. I also shot a arteriogram which revealed a widely patent bilateral renal vascular system. The patient did have elevated creatinine during his hospitalization. He has chronic atrial fibrillation. He was not on anticoagulation at the time of his presentation. Postoperatively, he did very well. He had return of palpable pedal pulses. He had no evidence of muscle damage. He did not require fasciotomies. He was able to be discharged on Xaralto. Marland Kitchen He's back today for followup with minimal complaints.  Both groin incisions are healing nicely. He has triphasic signals in both posterior tibial arteries.  His creatinine recently was 1.67. I have prescribed him Xaralto  20 mg to be taken once a day. His anticoagulation will be managed by [facility] part vascular. Because his creatinine was elevated, I have stopped his ARB. His blood pressure will need to be addressed by his family doctor. He is scheduled to see me back in the office in 6 months. At that time he'll have a duplex ultrasound.

## 2013-02-20 ENCOUNTER — Encounter: Payer: Self-pay | Admitting: *Deleted

## 2013-02-27 ENCOUNTER — Other Ambulatory Visit: Payer: Self-pay | Admitting: Physician Assistant

## 2013-02-27 NOTE — Telephone Encounter (Signed)
Please pull paper chart.  

## 2013-02-28 NOTE — Telephone Encounter (Signed)
Chart pulled to [place] pool [id number]

## 2013-03-01 NOTE — Telephone Encounter (Signed)
Is indomethacin for gout flare?  Now that he is on xarelto, I am concerned  for increased risk of bleeding with indomethacin.  If he is having a gout flare, he should come in to discuss treatment

## 2013-03-06 NOTE — Telephone Encounter (Signed)
Pt advised to come in.  He just wanted the indomethacin to keep on hand for a gout flare.

## 2013-03-08 ENCOUNTER — Ambulatory Visit (INDEPENDENT_AMBULATORY_CARE_PROVIDER_SITE_OTHER): Payer: BC Managed Care – PPO | Admitting: Emergency Medicine

## 2013-03-08 VITALS — BP 140/100 | HR 100 | Temp 98.0°F | Resp 16 | Ht 73.5 in | Wt 235.0 lb

## 2013-03-08 DIAGNOSIS — M109 Gout, unspecified: Secondary | ICD-10-CM

## 2013-03-08 DIAGNOSIS — I4891 Unspecified atrial fibrillation: Secondary | ICD-10-CM

## 2013-03-08 DIAGNOSIS — M25569 Pain in unspecified knee: Secondary | ICD-10-CM

## 2013-03-08 DIAGNOSIS — M25561 Pain in right knee: Secondary | ICD-10-CM

## 2013-03-08 LAB — POCT CBC
Granulocyte percent: 62.7 %G (ref 37–80)
HCT, POC: 43.6 % (ref 43.5–53.7)
Hemoglobin: 13.6 g/dL — AB (ref 14.1–18.1)
Lymph, poc: 2.2 (ref 0.6–3.4)
MCH, POC: 31.9 pg — AB (ref 27–31.2)
MCV: 102.4 fL — AB (ref 80–97)
MID (cbc): 0.6 (ref 0–0.9)
MPV: 10 fL (ref 0–99.8)
POC Granulocyte: 4.6 (ref 2–6.9)
POC LYMPH PERCENT: 29.7 %L (ref 10–50)
RBC: 4.26 M/uL — AB (ref 4.69–6.13)

## 2013-03-08 LAB — BASIC METABOLIC PANEL
CO2: 25 mEq/L (ref 19–32)
Chloride: 106 mEq/L (ref 96–112)
Creat: 1.69 mg/dL — ABNORMAL HIGH (ref 0.50–1.35)

## 2013-03-08 MED ORDER — PREDNISONE 20 MG PO TABS: ORAL_TABLET | ORAL | Status: DC

## 2013-03-08 NOTE — Patient Instructions (Addendum)
Gout Gout is an inflammatory condition (arthritis) caused by a buildup of uric acid crystals in the joints. Uric acid is a chemical that is normally present in the blood. Under some circumstances, uric acid can form into crystals in your joints. This causes joint redness, soreness, and swelling (inflammation). Repeat attacks are common. Over time, uric acid crystals can form into masses (tophi) near a joint, causing disfigurement. Gout is treatable and often preventable. CAUSES  The disease begins with elevated levels of uric acid in the blood. Uric acid is produced by your body when it breaks down a naturally found substance called purines. This also happens when you eat certain foods such as meats and fish. Causes of an elevated uric acid level include:  Being passed down from parent to child (heredity).  Diseases that cause increased uric acid production (obesity, psoriasis, some cancers).  Excessive alcohol use.  Diet, especially diets rich in meat and seafood.  Medicines, including certain cancer-fighting drugs (chemotherapy), diuretics, and aspirin.  Chronic kidney disease. The kidneys are no longer able to remove uric acid well.  Problems with metabolism. Conditions strongly associated with gout include:  Obesity.  High blood pressure.  High cholesterol.  Diabetes. Not everyone with elevated uric acid levels gets gout. It is not understood why some people get gout and others do not. Surgery, joint injury, and eating too much of certain foods are some of the factors that can lead to gout. SYMPTOMS   An attack of gout comes on quickly. It causes intense pain with redness, swelling, and warmth in a joint.  Fever can occur.  Often, only one joint is involved. Certain joints are more commonly involved:  Base of the big toe.  Knee.  Ankle.  Wrist.  Finger. Without treatment, an attack usually goes away in a few days to weeks. Between attacks, you usually will not have  symptoms, which is different from many other forms of arthritis. DIAGNOSIS  Your caregiver will suspect gout based on your symptoms and exam. Removal of fluid from the joint (arthrocentesis) is done to check for uric acid crystals. Your caregiver will give you a medicine that numbs the area (local anesthetic) and use a needle to remove joint fluid for exam. Gout is confirmed when uric acid crystals are seen in joint fluid, using a special microscope. Sometimes, blood, urine, and X-ray tests are also used. TREATMENT  There are 2 phases to gout treatment: treating the sudden onset (acute) attack and preventing attacks (prophylaxis). Treatment of an Acute Attack  Medicines are used. These include anti-inflammatory medicines or steroid medicines.  An injection of steroid medicine into the affected joint is sometimes necessary.  The painful joint is rested. Movement can worsen the arthritis.  You may use warm or cold treatments on painful joints, depending which works best for you.  Discuss the use of coffee, vitamin C, or cherries with your caregiver. These may be helpful treatment options. Treatment to Prevent Attacks After the acute attack subsides, your caregiver may advise prophylactic medicine. These medicines either help your kidneys eliminate uric acid from your body or decrease your uric acid production. You may need to stay on these medicines for a very long time. The early phase of treatment with prophylactic medicine can be associated with an increase in acute gout attacks. For this reason, during the first few months of treatment, your caregiver may also advise you to take medicines usually used for acute gout treatment. Be sure you understand your caregiver's directions.   You should also discuss dietary treatment with your caregiver. Certain foods such as meats and fish can increase uric acid levels. Other foods such as dairy can decrease levels. Your caregiver can give you a list of foods  to avoid. HOME CARE INSTRUCTIONS   Do not take aspirin to relieve pain. This raises uric acid levels.  Only take over-the-counter or prescription medicines for pain, discomfort, or fever as directed by your caregiver.  Rest the joint as much as possible. When in bed, keep sheets and blankets off painful areas.  Keep the affected joint raised (elevated).  Use crutches if the painful joint is in your leg.  Drink enough water and fluids to keep your urine clear or pale yellow. This helps your body get rid of uric acid. Do not drink alcoholic beverages. They slow the passage of uric acid.  Follow your caregiver's dietary instructions. Pay careful attention to the amount of protein you eat. Your daily diet should emphasize fruits, vegetables, whole grains, and fat-free or low-fat milk products.  Maintain a healthy body weight. SEEK MEDICAL CARE IF:   You have an oral temperature above 102 F (38.9 C).  You develop diarrhea, vomiting, or any side effects from medicines.  You do not feel better in 24 hours, or you are getting worse. SEEK IMMEDIATE MEDICAL CARE IF:   Your joint becomes suddenly more tender and you have:  Chills.  An oral temperature above 102 F (38.9 C), not controlled by medicine. MAKE SURE YOU:   Understand these instructions.  Will watch your condition.  Will get help right away if you are not doing well or get worse. Document Released: 12/10/2000 Document Revised: 03/06/2012 Document Reviewed: 03/23/2010 [employer] Patient Information 2013 [employer].    

## 2013-03-08 NOTE — Progress Notes (Deleted)
u

## 2013-03-08 NOTE — Progress Notes (Signed)
  Subjective:    Patient ID: Isaiah [name], male    DOB: 03-02-1961, 52 y.o.   MRN: [id number]  HPI Pt comes in today with symptoms of gout on his right knee. He ate shrimp yesterday which he thinks is what triggered this. He had surgery back in the 1980's on his knee from an injury. His last flare was 3 months ago. He has been in the hospital for a blood clot which he had removed successfully. Right knee really painful to the touch and inflamed.    Review of Systems     Objective:   Physical ExamRedness and tenderness over the patella.Tender lateral right ankle        Assessment & Plan:  Gout flare. Treat with Pred taper and crutches

## 2013-03-09 DIAGNOSIS — M109 Gout, unspecified: Secondary | ICD-10-CM | POA: Insufficient documentation

## 2013-03-19 ENCOUNTER — Telehealth: Payer: Self-pay

## 2013-03-19 NOTE — Telephone Encounter (Signed)
Letter amended. Called patient to advise. Number given rang to fax.

## 2013-03-19 NOTE — Telephone Encounter (Signed)
Patient needs an amended [facility] note RTW on the 17th   [id number]

## 2013-03-22 ENCOUNTER — Ambulatory Visit (INDEPENDENT_AMBULATORY_CARE_PROVIDER_SITE_OTHER): Payer: BC Managed Care – PPO | Admitting: Emergency Medicine

## 2013-03-22 VITALS — BP 136/106 | HR 76 | Temp 97.9°F | Resp 18 | Ht 73.0 in | Wt 235.0 lb

## 2013-03-22 DIAGNOSIS — R748 Abnormal levels of other serum enzymes: Secondary | ICD-10-CM

## 2013-03-22 DIAGNOSIS — Z0271 Encounter for disability determination: Secondary | ICD-10-CM

## 2013-03-22 DIAGNOSIS — M109 Gout, unspecified: Secondary | ICD-10-CM

## 2013-03-22 DIAGNOSIS — R011 Cardiac murmur, unspecified: Secondary | ICD-10-CM

## 2013-03-22 DIAGNOSIS — N289 Disorder of kidney and ureter, unspecified: Secondary | ICD-10-CM

## 2013-03-22 DIAGNOSIS — I10 Essential (primary) hypertension: Secondary | ICD-10-CM

## 2013-03-22 LAB — POCT UA - MICROSCOPIC ONLY
Crystals, Ur, HPF, POC: NEGATIVE
Yeast, UA: NEGATIVE

## 2013-03-22 LAB — POCT URINALYSIS DIPSTICK
Bilirubin, UA: NEGATIVE
Leukocytes, UA: NEGATIVE
Nitrite, UA: NEGATIVE
pH, UA: 6.5

## 2013-03-22 MED ORDER — ALLOPURINOL 100 MG PO TABS: 100.0000 mg | ORAL_TABLET | Freq: Every day | ORAL | Status: DC

## 2013-03-22 MED ORDER — LOSARTAN POTASSIUM 50 MG PO TABS: 50.0000 mg | ORAL_TABLET | Freq: Every day | ORAL | Status: DC

## 2013-03-22 NOTE — Progress Notes (Signed)
  Subjective:    Patient ID: Isaiah [name], male    DOB: 11-26-1961, 52 y.o.   MRN: [id number]  HPI  Patient is here today to follow up with his Gout that he was DX with 2 weeks ago. Patient states that he's much better after he started taking the medicine prednisone. Patient comes into the office also with FMLA papers because he has to take a few days off due to Gout. Patient has a complicated history. 2 months ago he had an embolectomy for clot which formed due to his atrial fibrillation and not being on anticoagulation. He has done well with this he is currently on 0 as his blood thinner. He was on losartan HCTZ and that was stopped on his last visit. He has a history of elevated creatinine in fact in 2009 had an ultrasound done which was suspicious for underlying renal disease.    Review of Systems     Objective:   Physical Exam blood pressure today is 130/100. He is alert cooperative. Joint examination reveals no evidence of acute gout. Neck is supple chest clear heart rhythm is irregular with a 2/6 systolic murmur. Abdomen soft nontender  Results for orders placed in visit on 03/22/13  POCT UA - MICROSCOPIC ONLY      Result Value Range   WBC, Ur, HPF, POC 0-3     RBC, urine, microscopic 0-3 with small clusters     Bacteria, U Microscopic neg     Mucus, UA neg     Epithelial cells, urine per micros 0-2     Crystals, Ur, HPF, POC neg     Casts, Ur, LPF, POC hyaline cast     Yeast, UA neg    POCT URINALYSIS DIPSTICK      Result Value Range   Color, UA yellow     Clarity, UA clear     Glucose, UA neg     Bilirubin, UA neg     Ketones, UA neg     Spec Grav, UA 1.020     Blood, UA trace-intact     pH, UA 6.5     Protein, UA trace     Urobilinogen, UA 0.2     Nitrite, UA neg     Leukocytes, UA Negative          Assessment & Plan:  His urine is normal. Will resume losartan 50 mg one a day and have him on low-dose allopurinol and 100 mg a day due to his renal dysfunction.

## 2013-03-23 LAB — BASIC METABOLIC PANEL
BUN: 15 mg/dL (ref 6–23)
Glucose, Bld: 82 mg/dL (ref 70–99)
Potassium: 4.3 mEq/L (ref 3.5–5.3)

## 2013-04-10 ENCOUNTER — Ambulatory Visit: Payer: BC Managed Care – PPO

## 2013-07-12 ENCOUNTER — Ambulatory Visit (INDEPENDENT_AMBULATORY_CARE_PROVIDER_SITE_OTHER): Payer: BC Managed Care – PPO | Admitting: Emergency Medicine

## 2013-07-12 VITALS — BP 138/80 | HR 98 | Temp 97.8°F | Resp 18 | Ht 73.0 in | Wt 240.0 lb

## 2013-07-12 DIAGNOSIS — M25579 Pain in unspecified ankle and joints of unspecified foot: Secondary | ICD-10-CM

## 2013-07-12 DIAGNOSIS — R7309 Other abnormal glucose: Secondary | ICD-10-CM

## 2013-07-12 DIAGNOSIS — I4891 Unspecified atrial fibrillation: Secondary | ICD-10-CM

## 2013-07-12 DIAGNOSIS — M109 Gout, unspecified: Secondary | ICD-10-CM

## 2013-07-12 DIAGNOSIS — M25571 Pain in right ankle and joints of right foot: Secondary | ICD-10-CM

## 2013-07-12 DIAGNOSIS — R739 Hyperglycemia, unspecified: Secondary | ICD-10-CM

## 2013-07-12 DIAGNOSIS — N289 Disorder of kidney and ureter, unspecified: Secondary | ICD-10-CM

## 2013-07-12 DIAGNOSIS — I10 Essential (primary) hypertension: Secondary | ICD-10-CM

## 2013-07-12 LAB — POCT URINALYSIS DIPSTICK
Bilirubin, UA: NEGATIVE
Glucose, UA: NEGATIVE
Leukocytes, UA: NEGATIVE
Nitrite, UA: NEGATIVE
Urobilinogen, UA: 0.2
pH, UA: 5.5

## 2013-07-12 LAB — POCT GLYCOSYLATED HEMOGLOBIN (HGB A1C): Hemoglobin A1C: 5.3

## 2013-07-12 LAB — BASIC METABOLIC PANEL
BUN: 20 mg/dL (ref 6–23)
Calcium: 11 mg/dL — ABNORMAL HIGH (ref 8.4–10.5)
Glucose, Bld: 100 mg/dL — ABNORMAL HIGH (ref 70–99)

## 2013-07-12 MED ORDER — OXYCODONE HCL 5 MG PO TABS: 5.0000 mg | ORAL_TABLET | Freq: Four times a day (QID) | ORAL | Status: DC | PRN

## 2013-07-12 MED ORDER — PREDNISONE 20 MG PO TABS: ORAL_TABLET | ORAL | Status: DC

## 2013-07-12 NOTE — Progress Notes (Signed)
  Subjective:    Patient ID: Isaiah [name], male    DOB: 04-06-1961, 52 y.o.   MRN: [id number]  HPI patient here with a complicated medical history. He has a history of atrial fibrillation. He developed a clot which lodged in his terminal aorta. This required emergency surgery. He has a history of chronic kidney disease with recurrent gout. He enters today with pain and swelling in his right ankle. He is currently on Zyloprim 100 mg one a day.    Review of Systems     Objective:   Physical Exam patient is alert and cooperative. His neck is supple. His cardiac exam is regular rhythm. Abdomen soft nontender extremity exam reveals palpable pulses in the right foot dorsalis pedis and posterior tibial. There is tenderness just posterior to the calcaneus and anterior to the Achilles. Results for orders placed in visit on 07/12/13  POCT URINALYSIS DIPSTICK      Result Value Range   Color, UA yellow     Clarity, UA clear     Glucose, UA neg     Bilirubin, UA neg     Ketones, UA neg     Spec Grav, UA 1.025     Blood, UA small     pH, UA 5.5     Protein, UA 30     Urobilinogen, UA 0.2     Nitrite, UA neg     Leukocytes, UA Negative    POCT GLYCOSYLATED HEMOGLOBIN (HGB A1C)      Result Value Range   Hemoglobin A1C 5.3      Meds ordered this encounter  Medications  . oxyCODONE (OXY IR/ROXICODONE) 5 MG immediate release tablet    Sig: Take 1-2 tablets (5-10 mg total) by mouth every 6 (six) hours as needed.    Dispense:  16 tablet    Refill:  0    Order Specific Question:  Supervising Provider    Answer:  Ruta Hinds E [[id number]]  . predniSONE (DELTASONE) 20 MG tablet    Sig: 3 a day for 3 days 2 a day for 3 days 1 a day for 3 days    Dispense:  18 tablet    Refill:  0   EKG shows atrial fibrillation without acute changes    Assessment & Plan:  Patient given prescriptions for prednisone and oxycodone. Routine labs were done to check on status of his kidney function. He also has an  elevated calcium and a parathormone level was done. I initially gave him a prescription for pain medications but he states he does not want to take this and it was voided

## 2013-07-13 LAB — PTH, INTACT AND CALCIUM
Calcium: 11 mg/dL — ABNORMAL HIGH (ref 8.4–10.5)
PTH: 293.4 pg/mL — ABNORMAL HIGH (ref 14.0–72.0)

## 2013-07-17 ENCOUNTER — Other Ambulatory Visit: Payer: Self-pay | Admitting: Emergency Medicine

## 2013-07-17 DIAGNOSIS — E213 Hyperparathyroidism, unspecified: Secondary | ICD-10-CM

## 2013-07-18 ENCOUNTER — Telehealth: Payer: Self-pay | Admitting: Physician Assistant

## 2013-07-18 NOTE — Telephone Encounter (Signed)
Message copied by Fara Chute on Wed Jul 18, 2013  6:35 PM ------      Message from: COE, RENEE R      Created: Wed Jul 18, 2013  3:50 PM       See note from Dr. Everlene Farrier ------

## 2013-07-18 NOTE — Telephone Encounter (Signed)
Spoke with patient.  Advised of lab results and plan. Referral has been made-in progress.

## 2013-08-10 ENCOUNTER — Encounter: Payer: Self-pay | Admitting: Surgery

## 2013-08-13 ENCOUNTER — Ambulatory Visit: Payer: BC Managed Care – PPO | Admitting: Surgery

## 2013-08-14 ENCOUNTER — Other Ambulatory Visit: Payer: Self-pay | Admitting: *Deleted

## 2013-08-14 ENCOUNTER — Other Ambulatory Visit: Payer: Self-pay | Admitting: Surgery

## 2013-08-14 MED ORDER — RIVAROXABAN 15 MG PO TABS: 15.0000 mg | ORAL_TABLET | Freq: Every day | ORAL | Status: DC

## 2013-08-14 NOTE — Telephone Encounter (Signed)
As per patient he's taking xarelto 15 mg daily.

## 2013-08-23 ENCOUNTER — Encounter: Payer: Self-pay | Admitting: *Deleted

## 2013-08-24 ENCOUNTER — Encounter: Payer: Self-pay | Admitting: Cardiology

## 2013-08-24 ENCOUNTER — Encounter: Payer: Self-pay | Admitting: Emergency Medicine

## 2013-08-28 ENCOUNTER — Encounter: Payer: Self-pay | Admitting: Cardiology

## 2013-08-28 ENCOUNTER — Ambulatory Visit (INDEPENDENT_AMBULATORY_CARE_PROVIDER_SITE_OTHER): Payer: BC Managed Care – PPO | Admitting: Cardiology

## 2013-08-28 VITALS — BP 120/90 | HR 76 | Ht 72.0 in | Wt 238.0 lb

## 2013-08-28 DIAGNOSIS — I4891 Unspecified atrial fibrillation: Secondary | ICD-10-CM

## 2013-08-28 DIAGNOSIS — I999 Unspecified disorder of circulatory system: Secondary | ICD-10-CM

## 2013-08-28 DIAGNOSIS — I998 Other disorder of circulatory system: Secondary | ICD-10-CM

## 2013-08-28 DIAGNOSIS — I10 Essential (primary) hypertension: Secondary | ICD-10-CM

## 2013-08-28 DIAGNOSIS — E669 Obesity, unspecified: Secondary | ICD-10-CM

## 2013-08-28 DIAGNOSIS — I4821 Permanent atrial fibrillation: Secondary | ICD-10-CM

## 2013-08-28 DIAGNOSIS — I2510 Atherosclerotic heart disease of native coronary artery without angina pectoris: Secondary | ICD-10-CM

## 2013-08-28 MED ORDER — NEBIVOLOL HCL 10 MG PO TABS: 10.0000 mg | ORAL_TABLET | Freq: Every day | ORAL | Status: DC

## 2013-08-28 MED ORDER — RIVAROXABAN 20 MG PO TABS: 20.0000 mg | ORAL_TABLET | Freq: Every day | ORAL | Status: DC

## 2013-08-28 MED ORDER — TADALAFIL 20 MG PO TABS: 20.0000 mg | ORAL_TABLET | ORAL | Status: DC | PRN

## 2013-08-28 NOTE — Patient Instructions (Addendum)
Your physician wants you to follow-up in 12 MONTH.  You will receive a reminder letter in the mail two months in advance. If you don't receive a letter, please call our office to schedule the follow-up appointment.  START Xarelto 20 mg  One tablet daily.  Continue with the rest of your medication

## 2013-09-10 ENCOUNTER — Encounter: Payer: Self-pay | Admitting: Cardiology

## 2013-09-10 DIAGNOSIS — E669 Obesity, unspecified: Secondary | ICD-10-CM | POA: Insufficient documentation

## 2013-09-10 NOTE — Progress Notes (Signed)
Isaiah [name]  04/07/1961  [id number]  PCP: Jenny Reichmann, MD  Clinic Note: Chief Complaint  Patient presents with  . ROV    C/o feet feel numb all the time-especially when he lays down   HPI: Isaiah [name] is a 52 y.o. male with a PMH below who presents today for annual followup. For a patient of Dr. Elisabeth Cara who I saw last in August 2013 after a 16 month..  Has a long-standing history of hypertension, chronic /Persistent atrial fibrillation history of TIA in the past.  He also had nonischemic cardiomyopathy with EF of 45-50% in the past that is now resolved as of last echocardiogram in January of this year.  In the past, he refused warfarin therapy. He was admitted in January of 2014 with sudden onset of bilateral limb ischemia and found to have bilateral common femoral superficial femoral and profunda femoral arterial emboli (with terminal aortic occlusion) treated with open exposure of bilateral femoral vessels with embolectomy.  There is occlusive thrombus noted in each groin.   It was quite clear that the likely source for his emboli was cardioembolic event. He was discharged on Xarelto to ensure that is only once a day.  He did well postoperatively.  Dr. Claiborne Billings who saw him in the hospital in consultation and recommended a sleep study for probable OSA -- unfortunate, can't tell that this test is actually been performed.. Interestingly, an echocardiogram performed during that timeframe demonstrated in resolution of his ischemic cardiomyopathy.  Interval History: He now presents for routine followup.  No real significant symptoms.  He just says it is be filled non-weight lies down to but not nearly the amount of pain or discomfort in his legs before.  Otherwise, he really denied any sensation whatsoever of his atrial fibrillation.  No rapid heartbeats or sense of palpitations.  No orthopnea or PND.  No chest tightness chest pressure with rest or exertion.  No dyspnea with rest or exertion.  No  further TIA or amaurosis fugax symptoms.  He denies any melena, hematochezia or hematuria.  Besides the numbness in his feet, he really denies any claudication symptoms.  Past Medical History  Diagnosis Date  . Hypertension   . Gout   . Pinched nerve in neck   . Extremity ischemia, critical bil lower ext. 01/24/2013  . Terminal aortic occlusion 01/24/2013  . HTN (hypertension) 01/24/2013  . Erectile dysfunction 01/24/2013  . CKD (chronic kidney disease) stage 3, GFR 30-59 ml/min 01/24/2013  . History of TIA (transient ischemic attack) [place] [facility]    hx of  . H/O right and left cardiac catheterization November 2009    30-40% RCA disease, cardiac output Fick 6.3, thermodilution 5.3.  Normal artery pressures.  . History of Non-ischemic cardiomyopathy 2009 - 2014    EF previously as low as 35-40% in 2009, up to 40 and 45% by 2012.  Repeat echo January 2014: Echo EF 55-60% mild LVH, mild to moderate left atrial enlargement, mild right atrial enlargement.  . Atrial fibrillation, permanent 01/24/2013    chronic,previuosly refuses coumadin,now on xarelto  . OSA on CPAP     use DME- Choice titration study was on 02/21/13   Prior Cardiac Evaluation and Past Surgical History: Past Surgical History  Procedure Laterality Date  . Knee surgery    . Embolectomy  01/23/2013    Procedure: EMBOLECTOMY;  Surgeon: Serafina Mitchell, MD;  Location: [facility] OR;  Service: Vascular;  Laterality: Bilateral;  Bilateral femoral Embolectomy, Bilateral Iliac  Embolectomy.  . Cardiac catheterization  Nov 2009    right and left  heart cath ,cardiac output 6.3 by FICK AND 5.34 by thermal  diluation.norm RV pressures and nonobstructive cor disease. no shunt.some intramyocardial bridgingof the LAD  . Carotid dopplers  Nov 2009    done for TIA which were normal  . Doppler echocardiography  03/08/2011    EF40-45%; LA mod to severe dilated     Allergies  Allergen Reactions  . Ace Inhibitors Swelling    angioedema  . Codeine  Itching    Current Outpatient Prescriptions  Medication Sig Dispense Refill  . allopurinol (ZYLOPRIM) 100 MG tablet Take 100 mg by mouth as needed.      Marland Kitchen aspirin 81 MG tablet Take 81 mg by mouth daily.      Marland Kitchen losartan (COZAAR) 50 MG tablet Take 1 tablet (50 mg total) by mouth daily.  90 tablet  3  . Multiple Vitamin (MULTIVITAMIN WITH MINERALS) TABS Take 1 tablet by mouth daily.      . nebivolol (BYSTOLIC) 10 MG tablet Take 1 tablet (10 mg total) by mouth daily.  30 tablet  11  . tadalafil (CIALIS) 20 MG tablet Take 1 tablet (20 mg total) by mouth as needed for erectile dysfunction.  4 tablet  6  . Rivaroxaban (XARELTO) 20 MG TABS tablet Take 1 tablet (20 mg total) by mouth daily.  30 tablet  11   No current facility-administered medications for this visit.   actually not taking Bystolic  History   Social History Narrative  . No narrative on file   ROS: A comprehensive Review of Systems - Negative except minor symptoms noted above.  PHYSICAL EXAM BP 120/90  Pulse 76  Ht 6' (1.829 m)  Wt 238 lb (107.956 kg)  BMI 32.27 kg/m2 General appearance: alert, cooperative, appears stated age, no distress and mildly obese Neck: no adenopathy, no carotid bruit, no JVD and supple, symmetrical, trachea midline Lungs: clear to auscultation bilaterally, normal percussion bilaterally and Nonlabored, good air movement Heart: irregularly irregular rhythm, S1, S2 normal, no S3 or S4 and No murmurs or rubs Abdomen: soft, non-tender; bowel sounds normal; no masses,  no organomegaly Extremities: extremities normal, atraumatic, no cyanosis or edema Pulses: Mildly diminished bilateral pedal pulses. Neurologic: Grossly normal  [id number] today: Yes Rate: 76 , Rhythm: Atrial fibrillation with controlled rate.  Left Axis Deviation;    Recent Labs:   Chemistry panel July 2014: BUN/creatinine 20/1.7, calcium 11, potassium 4.4  Cholesterol panel 01/25/2013: Total cholesterol 169, HDL 56, LDL 97, TG  78  ASSESSMENT / PLAN: No problem-specific assessment & plan notes found for this encounter.     Orders Placed This Encounter  Procedures  . EKG 12-Lead   Meds ordered this encounter  Medications  . DISCONTD: nebivolol (BYSTOLIC) 10 MG tablet    Sig: Take 10 mg by mouth daily.  Marland Kitchen allopurinol (ZYLOPRIM) 100 MG tablet    Sig: Take 100 mg by mouth as needed.  . nebivolol (BYSTOLIC) 10 MG tablet    Sig: Take 1 tablet (10 mg total) by mouth daily.    Dispense:  30 tablet    Refill:  11  . Rivaroxaban (XARELTO) 20 MG TABS tablet    Sig: Take 1 tablet (20 mg total) by mouth daily.    Dispense:  30 tablet    Refill:  11  . tadalafil (CIALIS) 20 MG tablet    Sig: Take 1 tablet (20 mg total) by mouth as  needed for erectile dysfunction.    Dispense:  4 tablet    Refill:  6    Followup: One year  DAVID W. Ellyn Hack, M.D., M.S. [facility] [address] [place], [place]  [address]  [phone] Pager # [phone]

## 2013-09-10 NOTE — Assessment & Plan Note (Signed)
He is pretty much rate control.  I do not see that he is axion rate control agents would be Bystolic now actually being used.  Only the monitor this to see if he has any problems with rapid rates.  Most definitely do to his critical ischemia event in January, he is now taking Xarelto, unfortunately he is not a correct dose.  He was taken to 50 mg which was the twice a day dosing for the first month and then go to 20 mg dosing.  Never went 20 mg dosing. All increasing to 20 mg a Xarelto daily.  He seems to be okay with taking this medication now which will cover him for his stroke and now significant lower extremity ischemia protection.

## 2013-09-10 NOTE — Assessment & Plan Note (Addendum)
We talked for a while at dietary modification and adjusting his life have as to allow for some more exercise.  He paid lip service to my recommendations only. He said that in the interim between March of this year and now is waiting on up to about 254 from 235 and is now back down 238.  This is suggestive he does have the ability to adjust his lifestyle to lose weight.

## 2013-09-10 NOTE — Assessment & Plan Note (Signed)
Currently pre-well controlled on moderate dose of losartan.  It was my impression that he was on the Bystolic but this according to him was not a medication that he is on.  We'll need to confirm this with the pharmacy

## 2013-09-10 NOTE — Assessment & Plan Note (Signed)
Status post embolectomy in both the aorta and bilateral femoral arteries.  Currently on Xarelto which we're increasing to the full therapeutic dose of 20 mg.

## 2013-09-17 ENCOUNTER — Ambulatory Visit: Payer: BC Managed Care – PPO | Admitting: Surgery

## 2013-09-21 ENCOUNTER — Encounter: Payer: Self-pay | Admitting: Family

## 2013-09-24 ENCOUNTER — Encounter (HOSPITAL_COMMUNITY): Payer: BC Managed Care – PPO

## 2013-09-24 ENCOUNTER — Ambulatory Visit: Payer: BC Managed Care – PPO | Admitting: Surgery

## 2013-09-24 ENCOUNTER — Ambulatory Visit: Payer: BC Managed Care – PPO | Admitting: Family

## 2013-09-24 NOTE — Progress Notes (Signed)
A user error has taken place: encounter opened in error, closed for administrative reasons.

## 2013-09-24 NOTE — Patient Instructions (Signed)
Peripheral Vascular Disease Peripheral Vascular Disease (PVD), also called Peripheral Arterial Disease (PAD), is a circulation problem caused by cholesterol (atherosclerotic plaque) deposits in the arteries. PVD commonly occurs in the lower extremities (legs) but it can occur in other areas of the body, such as your arms. The cholesterol buildup in the arteries reduces blood flow which can cause pain and other serious problems. The presence of PVD can place a person at risk for Coronary Artery Disease (CAD).  CAUSES  Causes of PVD can be many. It is usually associated with more than one risk factor such as:   High Cholesterol.  Smoking.  Diabetes.  Lack of exercise or inactivity.  High blood pressure (hypertension).  Obesity.  Family history. SYMPTOMS   When the lower extremities are affected, patients with PVD may experience:  Leg pain with exertion or physical activity. This is called INTERMITTENT CLAUDICATION. This may present as cramping or numbness with physical activity. The location of the pain is associated with the level of blockage. For example, blockage at the abdominal level (distal abdominal aorta) may result in buttock or hip pain. Lower leg arterial blockage may result in calf pain.  As PVD becomes more severe, pain can develop with less physical activity.  In people with severe PVD, leg pain may occur at rest.  Other PVD signs and symptoms:  Leg numbness or weakness.  Coldness in the affected leg or foot, especially when compared to the other leg.  A change in leg color.  Patients with significant PVD are more prone to ulcers or sores on toes, feet or legs. These may take longer to heal or may reoccur. The ulcers or sores can become infected.  If signs and symptoms of PVD are ignored, gangrene may occur. This can result in the loss of toes or loss of an entire limb.  Not all leg pain is related to PVD. Other medical conditions can cause leg pain such  as:  Blood clots (embolism) or Deep Vein Thrombosis.  Inflammation of the blood vessels (vasculitis).  Spinal stenosis. DIAGNOSIS  Diagnosis of PVD can involve several different types of tests. These can include:  Pulse Volume Recording Method (PVR). This test is simple, painless and does not involve the use of X-rays. PVR involves measuring and comparing the blood pressure in the arms and legs. An ABI (Ankle-Brachial Index) is calculated. The normal ratio of blood pressures is 1. As this number becomes smaller, it indicates more severe disease.  < 0.95  indicates significant narrowing in one or more leg vessels.  <0.8 there will usually be pain in the foot, leg or buttock with exercise.  <0.4 will usually have pain in the legs at rest.  <0.25  usually indicates limb threatening PVD.  Doppler detection of pulses in the legs. This test is painless and checks to see if you have a pulses in your legs/feet.  A dye or contrast material (a substance that highlights the blood vessels so they show up on x-ray) may be given to help your caregiver better see the arteries for the following tests. The dye is eliminated from your body by the kidney's. Your caregiver may order blood work to check your kidney function and other laboratory values before the following tests are performed:  Magnetic Resonance Angiography (MRA). An MRA is a picture study of the blood vessels and arteries. The MRA machine uses a large magnet to produce images of the blood vessels.  Computed Tomography Angiography (CTA). A CTA is a   specialized x-ray that looks at how the blood flows in your blood vessels. An IV may be inserted into your arm so contrast dye can be injected.  Angiogram. Is a procedure that uses x-rays to look at your blood vessels. This procedure is minimally invasive, meaning a small incision (cut) is made in your groin. A small tube (catheter) is then inserted into the artery of your groin. The catheter is  guided to the blood vessel or artery your caregiver wants to examine. Contrast dye is injected into the catheter. X-rays are then taken of the blood vessel or artery. After the images are obtained, the catheter is taken out. TREATMENT  Treatment of PVD involves many interventions which may include:  Lifestyle changes:  Quitting smoking.  Exercise.  Following a low fat, low cholesterol diet.  Control of diabetes.  Foot care is very important to the PVD patient. Good foot care can help prevent infection.  Medication:  Cholesterol-lowering medicine.  Blood pressure medicine.  Anti-platelet drugs.  Certain medicines may reduce symptoms of Intermittent Claudication.  Interventional/Surgical options:  Angioplasty. An Angioplasty is a procedure that inflates a balloon in the blocked artery. This opens the blocked artery to improve blood flow.  Stent Implant. A wire mesh tube (stent) is placed in the artery. The stent expands and stays in place, allowing the artery to remain open.  Peripheral Bypass Surgery. This is a surgical procedure that reroutes the blood around a blocked artery to help improve blood flow. This type of procedure may be performed if Angioplasty or stent implants are not an option. SEEK IMMEDIATE MEDICAL CARE IF:   You develop pain or numbness in your arms or legs.  Your arm or leg turns cold, becomes blue in color.  You develop redness, warmth, swelling and pain in your arms or legs. MAKE SURE YOU:   Understand these instructions.  Will watch your condition.  Will get help right away if you are not doing well or get worse. Document Released: 01/20/2005 Document Revised: 03/06/2012 Document Reviewed: 12/17/2008 [employer] Patient Information 2014 [employer].  

## 2013-09-24 NOTE — Progress Notes (Deleted)
[facility] [place] HISTORY AND PHYSICAL -PAD  Previous Bypass Surgery/ Stent Placement: {yes/no:20286} Surgeon:  History of Present Illness Isaiah [name] is a 52 y.o. male patient who is back today for followup. On 01/22/2013, he presented to the emergency department with lower extremity occlusion. He was complaining of leg pain. He did not have femoral pulses. He was taken emergently to the operating room. Via bilateral femoral incisions, aorto, ilio, femoral embolectomy was performed. I also shot a arteriogram which revealed a widely patent bilateral renal vascular system. The patient did have elevated creatinine during his hospitalization. He has chronic atrial fibrillation. He was not on anticoagulation at the time of his presentation. Postoperatively, he did very well. He had return of palpable pedal pulses. He had no evidence of muscle damage. He did not require fasciotomies. He was able to be discharged on Xaralto. Marland Kitchen He's back today for followup with minimal complaints.  Both groin incisions are healing nicely. He has triphasic signals in both posterior tibial arteries.  His creatinine recently was 1.67. I have prescribed him Xaralto 20 mg to be taken once a day. His anticoagulation will be managed by [facility] part vascular. Because his creatinine was elevated, I have stopped his ARB. His blood pressure will need to be addressed by his family doctor. He is scheduled to see me back in the office in 6 months. At that time he'll have a duplex ultrasound.        ***  Pt. {Actions; denies-reports:120008::"denies"} claudication in ***.  Pt. {Actions; denies-reports:120008::"denies"} rest pain; {Actions; denies-reports:120008::"denies"} night pain {Actions; denies-reports:120008::"denies"} non healing ulcers on {Right/left upper lower:12843} extremity.  Pt {HAS HAS CG:[id number] had previous intervention of {Right, left-initial cap:5607} {POST VASCULAR SURGERY:3041213}  .  {Actions; denies-reports:120008::"denies"} New Medical or Surgical History: ***   Pt Diabetic: {yes/no:20286} Pt smoker: {Smoker?:15292}  Pt meds include: Statin :{yes/no:20286} Betablocker: {yes/no:20286} ASA: {yes/no:20286} Other anticoagulants/antiplatelets: ***  Past Medical History  Diagnosis Date  . Hypertension   . Gout   . Pinched nerve in neck   . Extremity ischemia, critical bil lower ext. 01/24/2013    Status post bilateral common femoral, profunda femoral and superficial femoral arterial embolectomy  . Terminal aortic occlusion 01/24/2013    Status post embolectomy  . HTN (hypertension)   . Erectile dysfunction   . CKD (chronic kidney disease) stage 3, GFR 30-59 ml/min 01/24/2013    Baseline creatinine roughly 1.7-1.8.  . History of TIA (transient ischemic attack) 2005    Carotid Dopplers January 2014 negative for significant stenosis.  . H/O right and left cardiac catheterization November 2009    30-40% RCA disease, cardiac output Fick 6.3, thermodilution 5.3.  Normal artery pressures.  . History of Non-ischemic cardiomyopathy 2009 - 2014    EF previously as low as 35-40% in 2009, up to 40 and 45% by 2012.  Repeat echo January 2014: Echo EF 55-60% mild LVH, mild to moderate left atrial enlargement, mild right atrial enlargement.  . Atrial fibrillation, permanent 01/24/2013    chronic,previuosly refuses coumadin,now on xarelto  . OSA on CPAP     use DME- Choice titration study was on 02/21/13    Social History History  Substance Use Topics  . Smoking status: Never Smoker   . Smokeless tobacco: Never Used  . Alcohol Use: No    Family History Family History  Problem Relation Age of Onset  . Hypertension Mother   . Hypertension Father     Past Surgical History  Procedure Laterality  Date  . Knee surgery    . Embolectomy  01/23/2013    Procedure: EMBOLECTOMY;  Surgeon: Serafina Mitchell, MD;  Location: [facility];  Service: Vascular;  Laterality: Bilateral;   Bilateral femoral Embolectomy, Bilateral Iliac Embolectomy.  . Cardiac catheterization  Nov 2009    right and left  heart cath ,cardiac output 6.3 by FICK AND 5.34 by thermal  diluation.norm RV pressures and nonobstructive cor disease. no shunt.some intramyocardial bridgingof the LAD  . Carotid dopplers  Nov 2009    done for TIA which were normal  . Doppler echocardiography  03/08/2011    EF40-45%; LA mod to severe dilated     Allergies  Allergen Reactions  . Ace Inhibitors Swelling    angioedema  . Codeine Itching    Current Outpatient Prescriptions  Medication Sig Dispense Refill  . allopurinol (ZYLOPRIM) 100 MG tablet Take 100 mg by mouth as needed.      Marland Kitchen aspirin 81 MG tablet Take 81 mg by mouth daily.      Marland Kitchen losartan (COZAAR) 50 MG tablet Take 1 tablet (50 mg total) by mouth daily.  90 tablet  3  . Multiple Vitamin (MULTIVITAMIN WITH MINERALS) TABS Take 1 tablet by mouth daily.      . nebivolol (BYSTOLIC) 10 MG tablet Take 1 tablet (10 mg total) by mouth daily.  30 tablet  11  . Rivaroxaban (XARELTO) 20 MG TABS tablet Take 1 tablet (20 mg total) by mouth daily.  30 tablet  11  . tadalafil (CIALIS) 20 MG tablet Take 1 tablet (20 mg total) by mouth as needed for erectile dysfunction.  4 tablet  6   No current facility-administered medications for this visit.    ROS: [x]  Positive   [ ]  Denies  General:[ ]  Weight loss,  [ ]  Weight gain, [ ]  Fever, [ ]  chills Neurologic: [ ]  Dizziness, [ ]  Blackouts, [ ]  Seizure [ ]  Stroke, [ ]  "Mini stroke", [ ]  Slurred speech, [ ]  Temporary blindness;  [ ] weakness, [ ]  Hoarseness Cardiac: [ ]  Chest pain/pressure, [ ]  Shortness of breath at rest [ ]  Shortness of breath with exertion,  [ ]   Atrial fibrillation or irregular heartbeat Vascular:[ ]  Pain in legs with walking, [ ]  Pain in legs at rest ,[ ]  Pain in legs at night,  [ ]   Non-healing ulcer, [ ]  Blood clot in vein/DVT,   Pulmonary: [ ]  Home oxygen, [ ]   Productive cough, [ ]  Coughing up  blood,  [ ]  Asthma,  [ ]  Wheezing Musculoskeletal:  [ ]  Arthritis, [ ]  Low back pain,  [ ]  Joint pain Hematologic:[ ]  Easy Bruising, [ ]  Anemia; [ ]  Hepatitis Gastrointestinal: [ ]  Blood in stool,  [ ]  Gastroesophageal Reflux, [ ]  Trouble swallowing Urinary: [ ]  chronic Kidney disease, [ ]  on HD, [ ]  Burning with urination, [ ]  Frequent urination, [ ]  Difficulty urinating;  Skin: [ ]  Rashes, [ ]  Wounds     Physical Examination  There were no vitals filed for this visit.  General: A&O x 3, WDWN,  Gait: {desc; gait:10592} Eyes: PERRLA, Pulmonary: CTAB, without wheezes , rales or rhonchi Cardiac: {Desc; regular/irreg:14544} Rythm , without murmur          Carotid Bruits Left Right   {FINDINGS; POSITIVE NEGATIVE:567 759 9819} {FINDINGS; POSITIVE NEGATIVE:567 759 9819}  VASCULAR EXAM: Extremities {With/without:5700} ischemic changes *** {With/without:5700} Gangrene of ***; {With/without:5700} open wounds; {With/without:5700} drainage.                                                                                                          LE Pulses LEFT RIGHT       FEMORAL  {PE DOPPLER EXAM ORTHOSURG:330610::"*** palpable"}  {PE DOPPLER EXAM ORTHOSURG:330610::"*** palpable"}        POPLITEAL  {PE DOPPLER EXAM ORTHOSURG:330610::"*** palpable"}   {PE DOPPLER EXAM ORTHOSURG:330610::"*** palpable"}       POSTERIOR TIBIAL  {PE DOPPLER EXAM ORTHOSURG:330610::"*** palpable"}   {PE DOPPLER EXAM ORTHOSURG:330610::"*** palpable"}        DORSALIS PEDIS      ANTERIOR TIBIAL {PE DOPPLER EXAM ORTHOSURG:330610::"*** palpable"}   {PE DOPPLER EXAM ORTHOSURG:330610::"*** palpable"}        PERONEAL {PE DOPPLER EXAM ORTHOSURG:330610::"*** Palpable"}   {PE DOPPLER EXAM ORTHOSURG:330610::"*** Palpable"}    Abdomen: soft, NT, no masses Skin: no rashes, ulcers noted Musculoskeletal: no muscle wasting or atrophy  Neurologic: A&O X 3; Appropriate Affect ; SENSATION: normal;  MOTOR FUNCTION:  moving all extremities equally. Speech is fluent/normal    Non-Invasive Vascular Imaging: DATE: 09/24/2013 ABI: RIGHT ***, Waveforms: ***;  LEFT ***, Waveforms: *** DUPLEX SCAN OF BYPASS: ***  Previous angiogram: {yes no:315493::"Yes"} with findings of ***  Outside Studies/Documentation  ASSESSMENT: KALA GRATZ is a 52 y.o. male who presents with: {Right/left upper lower:12843} PAD WITH ***.   PLAN:  I discussed in depth with the patient the nature of atherosclerosis, and emphasized the importance of maximal medical management including strict control of blood pressure, blood glucose, and lipid levels, obtaining regular exercise, and cessation of smoking.  The patient is aware that without maximal medical management the underlying atherosclerotic disease process will progress, limiting the benefit of any interventions. Based on the patient's vascular studies and examination, pt will return to clinic in {NUMBERS 1-5:20334} {days/wks/mos/yrs:310907}  The patient was given information about PAD including signs, symptoms, treatment, what symptoms should prompt the patient to seek immediate medical care, and risk reduction measures to take.  Clemon Chambers, RN, MSN, FNP-C Office Phone: [phone]  Clinic MD: Trula Slade  09/24/2013 9:21 AM

## 2014-02-19 ENCOUNTER — Ambulatory Visit: Payer: Self-pay

## 2014-02-19 ENCOUNTER — Other Ambulatory Visit: Payer: Self-pay | Admitting: Occupational Medicine

## 2014-02-19 DIAGNOSIS — Z0000 Encounter for general adult medical examination without abnormal findings: Secondary | ICD-10-CM

## 2014-03-06 ENCOUNTER — Telehealth: Payer: Self-pay

## 2014-03-06 NOTE — Telephone Encounter (Signed)
Prior authorization for Cialis faxed to patient's insurance company on February 04, 2014. Awaiting approval.

## 2014-09-27 ENCOUNTER — Other Ambulatory Visit: Payer: Self-pay | Admitting: Cardiology

## 2014-09-27 NOTE — Telephone Encounter (Signed)
Rx was sent to pharmacy electronically. 

## 2014-11-06 ENCOUNTER — Other Ambulatory Visit: Payer: Self-pay | Admitting: Cardiology

## 2014-12-23 ENCOUNTER — Ambulatory Visit (INDEPENDENT_AMBULATORY_CARE_PROVIDER_SITE_OTHER): Payer: BC Managed Care – PPO | Admitting: Physician Assistant

## 2014-12-23 VITALS — BP 142/102 | HR 75 | Temp 97.7°F | Resp 20 | Ht 73.75 in | Wt 236.0 lb

## 2014-12-23 DIAGNOSIS — N183 Chronic kidney disease, stage 3 unspecified: Secondary | ICD-10-CM

## 2014-12-23 DIAGNOSIS — M10072 Idiopathic gout, left ankle and foot: Secondary | ICD-10-CM

## 2014-12-23 DIAGNOSIS — M109 Gout, unspecified: Secondary | ICD-10-CM

## 2014-12-23 LAB — COMPLETE METABOLIC PANEL WITH GFR
ALT: 43 U/L (ref 0–53)
AST: 46 U/L — AB (ref 0–37)
Albumin: 4.2 g/dL (ref 3.5–5.2)
Alkaline Phosphatase: 108 U/L (ref 39–117)
BUN: 15 mg/dL (ref 6–23)
CALCIUM: 10.6 mg/dL — AB (ref 8.4–10.5)
CO2: 23 meq/L (ref 19–32)
CREATININE: 1.63 mg/dL — AB (ref 0.50–1.35)
Chloride: 104 mEq/L (ref 96–112)
GFR, Est African American: 55 mL/min — ABNORMAL LOW
GFR, Est Non African American: 48 mL/min — ABNORMAL LOW
Glucose, Bld: 93 mg/dL (ref 70–99)
POTASSIUM: 5.6 meq/L — AB (ref 3.5–5.3)
SODIUM: 137 meq/L (ref 135–145)
TOTAL PROTEIN: 7.2 g/dL (ref 6.0–8.3)
Total Bilirubin: 0.5 mg/dL (ref 0.2–1.2)

## 2014-12-23 LAB — URIC ACID: URIC ACID, SERUM: 7.6 mg/dL (ref 4.0–7.8)

## 2014-12-23 MED ORDER — PREDNISONE 20 MG PO TABS: ORAL_TABLET | ORAL | Status: DC

## 2014-12-23 MED ORDER — COLCHICINE 0.6 MG PO TABS: 0.6000 mg | ORAL_TABLET | Freq: Every day | ORAL | Status: DC

## 2014-12-23 NOTE — Progress Notes (Signed)
    MRN: [id number] DOB: [birth year]  Subjective:   Isaiah [name] is a 53 y.o. male presenting for left foot pain for 2 days.  He reports that the top of his foot is painful and feels like the pain is travelling to his foot.  He reports no trauma to foot.  He is able to walk, but with pain.  He has taken motrin for the pain, which has done well.   Patient currently is working out a lot, drinks protein shakes, but notes that he has drink many carbonated/caffeinated beverages over the last week.    Isaiah [name] has a current medication list which includes the following prescription(s): aspirin, losartan, multivitamin with minerals, nebivolol, rivaroxaban, allopurinol, and tadalafil.  He is allergic to ace inhibitors and codeine.  Sok  has a past medical history of Hypertension; Gout; Pinched nerve in neck; Extremity ischemia, critical bil lower ext. (01/24/2013); Terminal aortic occlusion (01/24/2013); HTN (hypertension); Erectile dysfunction; CKD (chronic kidney disease) stage 3, GFR 30-59 ml/min (01/24/2013); History of TIA (transient ischemic attack) (2005); H/O right and left cardiac catheterization (November 2009); History of Non-ischemic cardiomyopathy (2009 - 2014); Atrial fibrillation, permanent (01/24/2013); and OSA on CPAP. Also  has past surgical history that includes Knee surgery; Embolectomy (01/23/2013); Cardiac catheterization (Nov 2009); CAROTID DOPPLERS (Nov 2009); and doppler echocardiography (03/08/2011).  ROS As in subjective.  Objective:   Vitals: BP 142/102 mmHg  Pulse 75  Temp(Src) 97.7 F (36.5 C) (Oral)  Resp 20  Ht 6' 1.75" (1.873 m)  Wt 236 lb (107.049 kg)  BMI 30.51 kg/m2  SpO2 98%  Physical Exam  Constitutional: He is well-developed, well-nourished, and in no distress. No distress.  HENT:  Head: Normocephalic and atraumatic.  Cardiovascular: Normal rate, regular rhythm and normal heart sounds.  Exam reveals no gallop and no friction rub.   No murmur  heard. Pulmonary/Chest: Effort normal and breath sounds normal. No respiratory distress. He has no wheezes.  Musculoskeletal:  Left foot has tenderness anteriorly of ankle joint.  There is no swelling.  Negative anterior drawer test.  Tenderness with passive eversion>inversion.  Decreased ROM and strength with plantar flexion and dorsiflexion.  No swelling, erythema, or tenderness at the 1st MTP.  Normal DP pulses.   Psychiatric: Mood and affect normal.    Assessment and Plan :  53 year old male is here for foot pain.  This presents with gout.  Uric acid and CMET obtained.  Will recheck uric acid for flare.  There is not enough swelling or effusion to aspirate fluid for confirmation at this time.  We will recheck uric acid and renal/liver function.  Ordered colchicine generic for 30 days.  He will take 2 tablets then 1 tablet the next hour, and start 1 tablet per day for 4 weeks.  He will return for uric acid check in 1 month.    Acute gout of left ankle, unspecified cause - Plan: colchicine 0.6 MG tablet, predniSONE (DELTASONE) 20 MG tablet, Uric Acid  Chronic kidney disease, stage III (moderate) - Plan: COMPLETE METABOLIC PANEL WITH GFR  Ivar Drape, PA-C [facility] 12/28/20158:33 PM .

## 2014-12-23 NOTE — Patient Instructions (Signed)
You will take 2 tablets of the colchicine, then you will take 1 tablet in 1 hour.   After 24 hours, or the next day, you may take 1 tablet. Return to the clinic in 4 weeks, for follow up and uric acid recheck.   Gout Gout is an inflammatory arthritis caused by a buildup of uric acid crystals in the joints. Uric acid is a chemical that is normally present in the blood. When the level of uric acid in the blood is too high it can form crystals that deposit in your joints and tissues. This causes joint redness, soreness, and swelling (inflammation). Repeat attacks are common. Over time, uric acid crystals can form into masses (tophi) near a joint, destroying bone and causing disfigurement. Gout is treatable and often preventable. CAUSES  The disease begins with elevated levels of uric acid in the blood. Uric acid is produced by your body when it breaks down a naturally found substance called purines. Certain foods you eat, such as meats and fish, contain high amounts of purines. Causes of an elevated uric acid level include:  Being passed down from parent to child (heredity).  Diseases that cause increased uric acid production (such as obesity, psoriasis, and certain cancers).  Excessive alcohol use.  Diet, especially diets rich in meat and seafood.  Medicines, including certain cancer-fighting medicines (chemotherapy), water pills (diuretics), and aspirin.  Chronic kidney disease. The kidneys are no longer able to remove uric acid well.  Problems with metabolism. Conditions strongly associated with gout include:  Obesity.  High blood pressure.  High cholesterol.  Diabetes. Not everyone with elevated uric acid levels gets gout. It is not understood why some people get gout and others do not. Surgery, joint injury, and eating too much of certain foods are some of the factors that can lead to gout attacks. SYMPTOMS   An attack of gout comes on quickly. It causes intense pain with redness,  swelling, and warmth in a joint.  Fever can occur.  Often, only one joint is involved. Certain joints are more commonly involved:  Base of the big toe.  Knee.  Ankle.  Wrist.  Finger. Without treatment, an attack usually goes away in a few days to weeks. Between attacks, you usually will not have symptoms, which is different from many other forms of arthritis. DIAGNOSIS  Your caregiver will suspect gout based on your symptoms and exam. In some cases, tests may be recommended. The tests may include:  Blood tests.  Urine tests.  X-rays.  Joint fluid exam. This exam requires a needle to remove fluid from the joint (arthrocentesis). Using a microscope, gout is confirmed when uric acid crystals are seen in the joint fluid. TREATMENT  There are two phases to gout treatment: treating the sudden onset (acute) attack and preventing attacks (prophylaxis).  Treatment of an Acute Attack.  Medicines are used. These include anti-inflammatory medicines or steroid medicines.  An injection of steroid medicine into the affected joint is sometimes necessary.  The painful joint is rested. Movement can worsen the arthritis.  You may use warm or cold treatments on painful joints, depending which works best for you.  Treatment to Prevent Attacks.  If you suffer from frequent gout attacks, your caregiver may advise preventive medicine. These medicines are started after the acute attack subsides. These medicines either help your kidneys eliminate uric acid from your body or decrease your uric acid production. You may need to stay on these medicines for a very long time.  The early phase of treatment with preventive medicine can be associated with an increase in acute gout attacks. For this reason, during the first few months of treatment, your caregiver may also advise you to take medicines usually used for acute gout treatment. Be sure you understand your caregiver's directions. Your caregiver may  make several adjustments to your medicine dose before these medicines are effective.  Discuss dietary treatment with your caregiver or dietitian. Alcohol and drinks high in sugar and fructose and foods such as meat, poultry, and seafood can increase uric acid levels. Your caregiver or dietitian can advise you on drinks and foods that should be limited. HOME CARE INSTRUCTIONS   Do not take aspirin to relieve pain. This raises uric acid levels.  Only take over-the-counter or prescription medicines for pain, discomfort, or fever as directed by your caregiver.  Rest the joint as much as possible. When in bed, keep sheets and blankets off painful areas.  Keep the affected joint raised (elevated).  Apply warm or cold treatments to painful joints. Use of warm or cold treatments depends on which works best for you.  Use crutches if the painful joint is in your leg.  Drink enough fluids to keep your urine clear or pale yellow. This helps your body get rid of uric acid. Limit alcohol, sugary drinks, and fructose drinks.  Follow your dietary instructions. Pay careful attention to the amount of protein you eat. Your daily diet should emphasize fruits, vegetables, whole grains, and fat-free or low-fat milk products. Discuss the use of coffee, vitamin C, and cherries with your caregiver or dietitian. These may be helpful in lowering uric acid levels.  Maintain a healthy body weight. SEEK MEDICAL CARE IF:   You develop diarrhea, vomiting, or any side effects from medicines.  You do not feel better in 24 hours, or you are getting worse. SEEK IMMEDIATE MEDICAL CARE IF:   Your joint becomes suddenly more tender, and you have chills or a fever. MAKE SURE YOU:   Understand these instructions.  Will watch your condition.  Will get help right away if you are not doing well or get worse. Document Released: 12/10/2000 Document Revised: 04/29/2014 Document Reviewed: 07/26/2012 [facility] Patient  Information 2015 [place], [place]. This information is not intended to replace advice given to you by your health care provider. Make sure you discuss any questions you have with your health care provider.

## 2015-01-21 ENCOUNTER — Other Ambulatory Visit: Payer: Self-pay | Admitting: Cardiology

## 2015-01-21 NOTE — Telephone Encounter (Signed)
Rx(s) sent to pharmacy electronically. Staff message sent to scheduler Rollene Fare to contact patient for appointment

## 2015-01-31 ENCOUNTER — Ambulatory Visit (INDEPENDENT_AMBULATORY_CARE_PROVIDER_SITE_OTHER): Payer: BLUE CROSS/BLUE SHIELD | Admitting: Cardiology

## 2015-01-31 VITALS — BP 172/100 | Ht 73.0 in | Wt 234.7 lb

## 2015-01-31 DIAGNOSIS — I4821 Permanent atrial fibrillation: Secondary | ICD-10-CM

## 2015-01-31 DIAGNOSIS — I10 Essential (primary) hypertension: Secondary | ICD-10-CM

## 2015-01-31 DIAGNOSIS — I2510 Atherosclerotic heart disease of native coronary artery without angina pectoris: Secondary | ICD-10-CM

## 2015-01-31 DIAGNOSIS — E669 Obesity, unspecified: Secondary | ICD-10-CM

## 2015-01-31 DIAGNOSIS — Z79899 Other long term (current) drug therapy: Secondary | ICD-10-CM

## 2015-01-31 DIAGNOSIS — E785 Hyperlipidemia, unspecified: Secondary | ICD-10-CM

## 2015-01-31 DIAGNOSIS — I482 Chronic atrial fibrillation: Secondary | ICD-10-CM

## 2015-01-31 MED ORDER — HYDROCHLOROTHIAZIDE 25 MG PO TABS: 25.0000 mg | ORAL_TABLET | Freq: Every day | ORAL | Status: DC

## 2015-01-31 NOTE — Patient Instructions (Signed)
Your physician has recommended you make the following change in your medication: start new prescription for  HCTZ 25 mg. This has already been sent to your pharmacy.  Your physician recommends that you return for lab work fasting.  Your physician recommends that you schedule a follow-up appointment in: 2 months with [facility] and 6 months with Dr. Ellyn Hack.

## 2015-02-02 ENCOUNTER — Encounter: Payer: Self-pay | Admitting: Cardiology

## 2015-02-02 DIAGNOSIS — Z79899 Other long term (current) drug therapy: Secondary | ICD-10-CM | POA: Insufficient documentation

## 2015-02-02 DIAGNOSIS — E785 Hyperlipidemia, unspecified: Secondary | ICD-10-CM | POA: Insufficient documentation

## 2015-02-02 NOTE — Progress Notes (Signed)
Isaiah [name]  08/11/1961  [id number]  PCP: Jenny Reichmann, MD  Clinic Note: Chief Complaint  Patient presents with  . Annual Exam    patient reports no problems since his last visit.   HPI: Isaiah [name] is a 54 y.o. male with a PMH below who presents today for annual followup. He is a former patient of Dr. Elisabeth Cara who I saw last in August 2013 after..  Has a long-standing history of hypertension, chronic /Persistent atrial fibrillation history of TIA in the past.  He also had nonischemic cardiomyopathy with EF of 45-50% in the past that is now resolved as of last echocardiogram in January of this year.  In the past, he refused warfarin therapy. He was admitted in January of 2014 with sudden onset of bilateral limb ischemia and found to have bilateral common femoral superficial femoral and profunda femoral arterial emboli (with terminal aortic occlusion) treated with open exposure of bilateral femoral vessels with embolectomy.  There is occlusive thrombus noted in each groin.   It was quite clear that the likely source for his emboli was cardioembolic event. He was discharged on Xarelto to ensure that is only once a day.  He did well postoperatively.  Interestingly, an echocardiogram performed during that timeframe demonstrated in resolution of his ischemic cardiomyopathy.  Interval History: He now presents for routine followup.  No real significant symptoms.  He had a brief bout of Gout back in December, but has been stable from a cardiology standpoint.   Denies any sensation of rapid or irregular heartbeat/palpitations to suggest recurrent A Fib.  He also denies any heart failure symptoms of PND, orthopnea or edema.  No chest tightness /chest pressure or notable dyspnea with rest or exertion.  No further TIA or amaurosis fugax symptoms.  He denies any melena, hematochezia or hematuria.  He also denies any significant claudication symptoms. He works out at [employer] about 30 minutes to 60  minutes every day. This includes treadmill running, lifting weights and other strength & conditioning exercises.  He denies any headaches or blurred vision, dizziness or wooziness. No loss of balance.   Past Medical History  Diagnosis Date  . Hypertension   . Gout   . Pinched nerve in neck   . Extremity ischemia, critical bil lower ext. 01/24/2013    Status post bilateral common femoral, profunda femoral and superficial femoral arterial embolectomy  . Terminal aortic occlusion 01/24/2013    Status post embolectomy  . HTN (hypertension)   . Erectile dysfunction   . CKD (chronic kidney disease) stage 3, GFR 30-59 ml/min 01/24/2013    Baseline creatinine roughly 1.7-1.8.  . History of TIA (transient ischemic attack) 2005    Carotid Dopplers January 2014 negative for significant stenosis.  . H/O right and left cardiac catheterization November 2009    30-40% RCA disease, cardiac output Fick 6.3, thermodilution 5.3.  Normal artery pressures.  . History of Non-ischemic cardiomyopathy 2009 - 2014    EF previously as low as 35-40% in 2009, up to 40 and 45% by 2012.  Repeat echo January 2014: Echo EF 55-60% mild LVH, mild to moderate left atrial enlargement, mild right atrial enlargement.  . Atrial fibrillation, permanent 01/24/2013    chronic,previuosly refuses coumadin,now on xarelto  . OSA on CPAP     use DME- Choice titration study was on 02/21/13   Prior Cardiac Evaluation and Past Surgical History: Past Surgical History  Procedure Laterality Date  . Knee surgery    .  Embolectomy  01/23/2013    Procedure: EMBOLECTOMY;  Surgeon: Serafina Mitchell, MD;  Location: [facility] OR;  Service: Vascular;  Laterality: Bilateral;  Bilateral femoral Embolectomy, Bilateral Iliac Embolectomy.  . Cardiac catheterization  Nov 2009    right and left  heart cath ,cardiac output 6.3 by FICK AND 5.34 by thermal  diluation.norm RV pressures and nonobstructive cor disease. no shunt.some intramyocardial bridgingof the LAD    . Carotid dopplers  Nov 2009    done for TIA which were normal  . Doppler echocardiography  02/2011; 12/2012    a) EF40-45%; LA mod to severe dilated ;; b) EF 55-60%, mild LVH, Mild-Mod LA dilation    Allergies  Allergen Reactions  . Ace Inhibitors Swelling    angioedema  . Codeine Itching    Current Outpatient Prescriptions  Medication Sig Dispense Refill  . aspirin 81 MG tablet Take 81 mg by mouth daily.    . Multiple Vitamin (MULTIVITAMIN WITH MINERALS) TABS Take 1 tablet by mouth daily.    . nebivolol (BYSTOLIC) 10 MG tablet Take 1 tablet (10 mg total) by mouth daily. <PLEASE MAKE APPOINTMENT FOR REFILLS> 15 tablet 0  . rivaroxaban (XARELTO) 20 MG TABS tablet Take 1 tablet (20 mg total) by mouth daily with supper. 30 tablet 5  . hydrochlorothiazide (HYDRODIURIL) 25 MG tablet Take 1 tablet (25 mg total) by mouth daily. 30 tablet 6   No current facility-administered medications for this visit.   actually not taking Bystolic  History   Social History Narrative   ROS: A comprehensive Review of Systems - was performed. Review of Systems  HENT: Negative for nosebleeds.   Eyes: Negative for blurred vision.  Respiratory: Negative for hemoptysis.   Gastrointestinal: Negative for blood in stool and melena.  Genitourinary: Negative for hematuria.  Musculoskeletal: Negative.   Neurological: Negative for dizziness, seizures, loss of consciousness, weakness and headaches.  Endo/Heme/Allergies: Does not bruise/bleed easily.  Psychiatric/Behavioral: Negative.   All other systems reviewed and are negative.  PHYSICAL EXAM BP 172/100 mmHg  Ht 6\' 1"  (1.854 m)  Wt 234 lb 11.2 oz (106.459 kg)  BMI 30.97 kg/m2 General appearance: alert, cooperative, appears stated age, no distress and mildly obese Neck: no adenopathy, no carotid bruit, no JVD and supple, symmetrical, trachea midline Lungs: CTAB, normal percussion bilaterally and Nonlabored, good air movement Heart: Irreg Irreg, S1 & S2  normal, + Soft S4, but no S3 gallop, and No murmurs or rubs Abdomen: soft, non-tender; bowel sounds normal; no masses,  no organomegaly Extremities: extremities normal, atraumatic, no cyanosis or edema Pulses: Mildly diminished bilateral pedal pulses. Neurologic: Grossly normal  [id number] today: Yes Rate: 73 , Rhythm: Atrial fibrillation with controlled rate.  LAFB (-48);    Recent Labs:     Chemistry      Component Value Date/Time   NA 137 12/23/2014 1047   K 5.6* 12/23/2014 1047   CL 104 12/23/2014 1047   CO2 23 12/23/2014 1047   BUN 15 12/23/2014 1047   CREATININE 1.63* 12/23/2014 1047   CREATININE 1.72* 01/25/2013 0506      Component Value Date/Time   CALCIUM 10.6* 12/23/2014 1047   ALKPHOS 108 12/23/2014 1047   AST 46* 12/23/2014 1047   ALT 43 12/23/2014 1047   BILITOT 0.5 12/23/2014 1047     ASSESSMENT & PLAN: Essential hypertension Not as well-controlled today as anticipated.  He is only on bystolic 10 mg, somewhere along the line his ARB was stopped. Allergies they do seem to be  restarted. However will start with HCTZ 25 mg daily. Check chemistry panel within the next week or 2 following HCTZ. He will then be scheduled to see our clinical pharmacist Tommy Medal for blood pressure followup. I would expect he'll probably need to be back on ARB, especially in light of his prior cardiomyopathy   CAD (coronary artery disease), non obstructive by cardiac cath 2009 Not taking aspirin because of Xarelto. He is on beta blocker, anticipate restarting ARB. He is not on cholesterol-lowering medicine but was relatively close to goal back in 2014. We will go ahead and check a lipid panel while we check his chemistries   Atrial fibrillation, permanent Asymptomatic with rate control using beta blocker. He still exercises without any trouble. No bleeding issues while on Xarelto.   Drug therapy Will need followup chemistries after starting HCTZ. Monitor for signs of  recurrent gout, if so would need to stop and switch to ARB   Dyslipidemia Last labs I have were from January 2014. LDL is below 100 which was pretty much close to goal. We will recheck now.   Obesity (BMI 30-39.9) I really don't think he is "obese I think this is mostly muscle mass related. Exercising routinely     Meds ordered this encounter  Medications  . hydrochlorothiazide (HYDRODIURIL) 25 MG tablet    Sig: Take 1 tablet (25 mg total) by mouth daily.    Dispense:  30 tablet    Refill:  6   Orders Placed This Encounter  Procedures  . Comprehensive metabolic panel  . Lipid panel  . EKG 12-Lead    Follow-up ~2 months with Tommy Medal, RPH-CPP for BP check Follow-up - 1 Yr with Dr. Micki Riley, DAVID Viona Gilmore, M.D., M.S. Interventional Cardiologist   Pager # [phone]

## 2015-02-02 NOTE — Assessment & Plan Note (Signed)
Not as well-controlled today as anticipated.  He is only on bystolic 10 mg, somewhere along the line his ARB was stopped. Allergies they do seem to be restarted. However will start with HCTZ 25 mg daily. Check chemistry panel within the next week or 2 following HCTZ. He will then be scheduled to see our clinical pharmacist Tommy Medal for blood pressure followup. I would expect he'll probably need to be back on ARB, especially in light of his prior cardiomyopathy

## 2015-02-02 NOTE — Assessment & Plan Note (Signed)
Asymptomatic with rate control using beta blocker. He still exercises without any trouble. No bleeding issues while on Xarelto.

## 2015-02-02 NOTE — Assessment & Plan Note (Signed)
Will need followup chemistries after starting HCTZ. Monitor for signs of recurrent gout, if so would need to stop and switch to ARB

## 2015-02-02 NOTE — Assessment & Plan Note (Signed)
Last labs I have were from January 2014. LDL is below 100 which was pretty much close to goal. We will recheck now.

## 2015-02-02 NOTE — Assessment & Plan Note (Signed)
I really don't think he is "obese I think this is mostly muscle mass related. Exercising routinely

## 2015-02-02 NOTE — Assessment & Plan Note (Signed)
Not taking aspirin because of Xarelto. He is on beta blocker, anticipate restarting ARB. He is not on cholesterol-lowering medicine but was relatively close to goal back in 2014. We will go ahead and check a lipid panel while we check his chemistries

## 2015-02-05 ENCOUNTER — Other Ambulatory Visit: Payer: Self-pay | Admitting: Cardiology

## 2015-02-05 NOTE — Telephone Encounter (Signed)
Rx(s) sent to pharmacy electronically.  

## 2015-03-16 ENCOUNTER — Ambulatory Visit (INDEPENDENT_AMBULATORY_CARE_PROVIDER_SITE_OTHER): Payer: Worker's Compensation | Admitting: Family Medicine

## 2015-03-16 ENCOUNTER — Ambulatory Visit (INDEPENDENT_AMBULATORY_CARE_PROVIDER_SITE_OTHER): Payer: Self-pay

## 2015-03-16 VITALS — BP 150/106 | HR 67 | Temp 97.6°F | Resp 20 | Ht 73.0 in | Wt 232.4 lb

## 2015-03-16 DIAGNOSIS — M545 Low back pain, unspecified: Secondary | ICD-10-CM

## 2015-03-16 DIAGNOSIS — T148XXA Other injury of unspecified body region, initial encounter: Secondary | ICD-10-CM

## 2015-03-16 MED ORDER — CYCLOBENZAPRINE HCL 10 MG PO TABS: 10.0000 mg | ORAL_TABLET | Freq: Three times a day (TID) | ORAL | Status: DC | PRN

## 2015-03-16 NOTE — Progress Notes (Signed)
Chief Complaint:  Chief Complaint  Patient presents with  . Back Pain    started on 3/16 at work--he slipped and twisted his back.  still having tightness with sitting and standing in lower middle back    HPI: Isaiah [name] is a 54 y.o. male who is here for low back pain. He required entry at work. He was trying to grab a door at the same time as another person and got pulled and slipped on a slick wet area on the floor. He did not fall but didn't feel like there was some twisting in his back. He did not feel the pain immediately however It was worse 1 day and half after that. He has 9/10 pain, sharp pain described as " drop to his knees kind of pain " when he gets up, then when he walks the tightneess is relieved and the pain level is 3-4/10. He has tried heat and ice. He has not hurt his back before. He has no incontinence or anesthesia.  Denies n/w/t/abd pain. He has chronic kidney disease. He cannot take anti-inflammatories.  Past Medical History  Diagnosis Date  . Hypertension   . Gout   . Pinched nerve in neck   . Extremity ischemia, critical bil lower ext. 01/24/2013    Status post bilateral common femoral, profunda femoral and superficial femoral arterial embolectomy  . Terminal aortic occlusion 01/24/2013    Status post embolectomy  . HTN (hypertension)   . Erectile dysfunction   . CKD (chronic kidney disease) stage 3, GFR 30-59 ml/min 01/24/2013    Baseline creatinine roughly 1.7-1.8.  . History of TIA (transient ischemic attack) 2005    Carotid Dopplers January 2014 negative for significant stenosis.  . H/O right and left cardiac catheterization November 2009    30-40% RCA disease, cardiac output Fick 6.3, thermodilution 5.3.  Normal artery pressures.  . History of Non-ischemic cardiomyopathy 2009 - 2014    EF previously as low as 35-40% in 2009, up to 40 and 45% by 2012.  Repeat echo January 2014: Echo EF 55-60% mild LVH, mild to moderate left atrial enlargement, mild  right atrial enlargement.  . Atrial fibrillation, permanent 01/24/2013    chronic,previuosly refuses coumadin,now on xarelto  . OSA on CPAP     use DME- Choice titration study was on 02/21/13   Past Surgical History  Procedure Laterality Date  . Knee surgery    . Embolectomy  01/23/2013    Procedure: EMBOLECTOMY;  Surgeon: Serafina Mitchell, MD;  Location: [facility] OR;  Service: Vascular;  Laterality: Bilateral;  Bilateral femoral Embolectomy, Bilateral Iliac Embolectomy.  . Cardiac catheterization  Nov 2009    right and left  heart cath ,cardiac output 6.3 by FICK AND 5.34 by thermal  diluation.norm RV pressures and nonobstructive cor disease. no shunt.some intramyocardial bridgingof the LAD  . Carotid dopplers  Nov 2009    done for TIA which were normal  . Doppler echocardiography  02/2011; 12/2012    a) EF40-45%; LA mod to severe dilated ;; b) EF 55-60%, mild LVH, Mild-Mod LA dilation   History   Social History  . Marital Status: Divorced    Spouse Name: N/A  . Number of Children: N/A  . Years of Education: N/A   Social History Main Topics  . Smoking status: Never Smoker   . Smokeless tobacco: Never Used  . Alcohol Use: No  . Drug Use: No  . Sexual Activity: Yes  Comment: married   Other Topics Concern  . None   Social History Narrative   Family History  Problem Relation Age of Onset  . Hypertension Mother   . Hypertension Father    Allergies  Allergen Reactions  . Ace Inhibitors Swelling    angioedema  . Codeine Itching   Prior to Admission medications   Medication Sig Start Date End Date Taking? Authorizing Provider  hydrochlorothiazide (HYDRODIURIL) 25 MG tablet Take 1 tablet (25 mg total) by mouth daily. 01/31/15  Yes Leonie Man, MD  Multiple Vitamin (MULTIVITAMIN WITH MINERALS) TABS Take 1 tablet by mouth daily.   Yes Historical Provider, MD  nebivolol (BYSTOLIC) 10 MG tablet Take 1 tablet (10 mg total) by mouth daily. 02/05/15  Yes Leonie Man, MD    rivaroxaban (XARELTO) 20 MG TABS tablet Take 1 tablet (20 mg total) by mouth daily with supper. 01/21/15  Yes Leonie Man, MD     ROS: The patient denies fevers, chills, night sweats, unintentional weight loss, chest pain, palpitations, wheezing, dyspnea on exertion, nausea, vomiting, abdominal pain, dysuria, hematuria, melena, numbness, weakness, or tingling.   All other systems have been reviewed and were otherwise negative with the exception of those mentioned in the HPI and as above.    PHYSICAL EXAM: Filed Vitals:   03/16/15 0938  BP: 150/106  Pulse: 67  Temp: 97.6 F (36.4 C)  Resp: 20   Filed Vitals:   03/16/15 0938  Height: 6\' 1"  (1.854 m)  Weight: 232 lb 6 oz (105.405 kg)   Body mass index is 30.66 kg/(m^2).  General: Alert, no acute distress HEENT:  Normocephalic, atraumatic, oropharynx patent. EOMI, PERRLA Cardiovascular: Irregular, irregular, no rubs murmurs or gallops.  No Carotid bruits, radial pulse intact. No pedal edema.  Respiratory: Clear to auscultation bilaterally.  No wheezes, rales, or rhonchi.  No cyanosis, no use of accessory musculature GI: No organomegaly, abdomen is soft and non-tender, positive bowel sounds.  No masses. Skin: No rashes. Neurologic: Facial musculature symmetric. Psychiatric: Patient is appropriate throughout our interaction. Lymphatic: No cervical lymphadenopathy Musculoskeletal: Gait intact. + paramsk tenderness  Full ROM 5/5 strength, 2/2 DTRs Negative for saddle anesthesia Straight leg negative Hip and knee exam--normal    LABS:    EKG/XRAY:   Primary read [facility] Marin [facility]. Neg For fracture or  dislocation. He does have some anterior bonespurs.   ASSESSMENT/PLAN: Encounter Diagnoses  Name Primary?  . Midline low back pain without sciatica Yes  . Sprain and strain    54 year old gentleman with low back sprain. No evidence of dislocation or fracture on x-ray. He cannot take any  anti-inflammatories due to chronic kidney disease.  We will prescribe Flexeril 10 mg 1/2-1 tablet by mouth 3 times a day when necessary. Advised to just try it at night since may cause drowsiness. Follow-up in one week for recheck.   Gross sideeffects, risk and benefits, and alternatives of medications d/w patient. Patient is aware that all medications have potential sideeffects and we are unable to predict every sideeffect or drug-drug interaction that may occur.  Kenika Sahm, [place], DO 03/16/2015 11:14 AM

## 2015-03-16 NOTE — Patient Instructions (Signed)
Back Injury Prevention The following tips can help you to prevent a back injury. PHYSICAL FITNESS  Exercise often. Try to develop strong stomach (abdominal) muscles.  Do aerobic exercises often. This includes walking, jogging, biking, swimming.  Do exercises that help with balance and strength often. This includes tai chi and yoga.  Stretch before and after you exercise.  Keep a healthy weight. DIET   Ask your doctor how much calcium and vitamin D you need every day.  Include calcium in your diet. Foods high in calcium include dairy products; green, leafy vegetables; and products with calcium added (fortified).  Include vitamin D in your diet. Foods high in vitamin D include milk and products with vitamin D added.  Think about taking a multivitamin or other nutritional products called " supplements."  Stop smoking if you smoke. POSTURE   Sit and stand up straight. Avoid leaning forward or hunching over.  Choose chairs that support your lower back.  If you work at a desk:  Sit close to your work so you do not lean over.  Keep your chin tucked in.  Keep your neck drawn back.  Keep your elbows bent at a right angle. Your arms should look like the letter "L."  Sit high and close to the steering wheel when you drive. Add low back support to your car seat if needed.  Avoid sitting or standing in one position for too long. Get up and move around every hour. Take breaks if you are driving for a long time.  Sleep on your side with your knees slightly bent. You can also sleep on your back with a pillow under your knees. Do not sleep on your stomach. LIFTING, TWISTING, AND REACHING  Avoid heavy lifting, especially lifting over and over again. If you must do heavy lifting:  Stretch before lifting.  Work slowly.  Rest between lifts.  Use carts and dollies to move objects when possible.  Make several small trips instead of carrying 1 heavy load.  Ask for help when you  need it.  Ask for help when moving big, awkward objects.  Follow these steps when lifting:  Stand with your feet shoulder-width apart.  Get as close to the object as you can. Do not pick up heavy objects that are far from your body.  Use handles or lifting straps when possible.  Bend at your knees. Squat down, but keep your heels off the floor.  Keep your shoulders back, your chin tucked in, and your back straight.  Lift the object slowly. Tighten the muscles in your legs, stomach, and butt. Keep the object as close to the center of your body as possible.  Reverse these directions when you put a load down.  Do not:  Lift the object above your waist.  Twist at the waist while lifting or carrying a load. Move your feet if you need to turn, not your waist.  Bend over without bending at your knees.  Avoid reaching over your head, across a table, or for an object on a high surface. OTHER TIPS  Avoid wet floors and keep sidewalks clear of ice.  Do not sleep on a mattress that is too soft or too hard.  Keep items that you use often within easy reach.  Put heavier objects on shelves at waist level. Put lighter objects on lower or higher shelves.  Find ways to lessen your stress. You can try exercise, massage, or relaxation.  Get help for depression or anxiety if  needed. GET HELP IF:  You injure your back.  You have questions about diet, exercise, or other ways to prevent back injuries. MAKE SURE YOU:  Understand these instructions.  Will watch your condition.  Will get help right away if you are not doing well or get worse. Document Released: 05/31/2008 Document Revised: 03/06/2012 Document Reviewed: 01/24/2012 [facility] [place] Patient Information 2015 [place], [place]. This information is not intended to replace advice given to you by your health care provider. Make sure you discuss any questions you have with your health care provider.

## 2015-03-21 ENCOUNTER — Other Ambulatory Visit: Payer: Self-pay | Admitting: Family Medicine

## 2015-04-02 ENCOUNTER — Ambulatory Visit: Payer: Self-pay | Admitting: Pharmacist Clinician (PhC)/ Clinical Pharmacy Specialist

## 2015-04-20 ENCOUNTER — Ambulatory Visit (INDEPENDENT_AMBULATORY_CARE_PROVIDER_SITE_OTHER): Payer: BLUE CROSS/BLUE SHIELD | Admitting: Family Medicine

## 2015-04-20 VITALS — BP 146/94 | HR 88 | Temp 98.2°F | Resp 18

## 2015-04-20 DIAGNOSIS — M10072 Idiopathic gout, left ankle and foot: Secondary | ICD-10-CM

## 2015-04-20 DIAGNOSIS — M109 Gout, unspecified: Secondary | ICD-10-CM

## 2015-04-20 MED ORDER — PREDNISONE 20 MG PO TABS: ORAL_TABLET | ORAL | Status: DC

## 2015-04-20 NOTE — Patient Instructions (Signed)

## 2015-04-20 NOTE — Progress Notes (Signed)
Patient ID: Isaiah [name], male   DOB: 09/16/1961, 54 y.o.   MRN: [id number]   This chart was scribed for Robyn Haber, MD by [facility], medical scribe at [facility].The patient was seen in exam room 04 and the patient's care was started at 9:14 AM.  Patient ID: Isaiah [name] MRN: [id number], DOB: Jun 23, 1961, 54 y.o. Date of Encounter: 04/20/2015  Primary Physician: Jenny Reichmann, MD  Chief Complaint:  Chief Complaint  Patient presents with   Gout    left foot, x 2 days    HPI:  Isaiah [name] is a 54 y.o. male with a history of gout who presents to [facility] complaining of an acute gout flare up in his left ankle and left greater toe, onset Friday. Pt states on Friday he ate shrimp which he believes was the most likely cause of flare up.Pt drinks a lot of water. He takes Xarelto for A-fib. He works at [employer].  Past Medical History  Diagnosis Date   Hypertension    Gout    Pinched nerve in neck    Extremity ischemia, critical bil lower ext. 01/24/2013    Status post bilateral common femoral, profunda femoral and superficial femoral arterial embolectomy   Terminal aortic occlusion 01/24/2013    Status post embolectomy   HTN (hypertension)    Erectile dysfunction    CKD (chronic kidney disease) stage 3, GFR 30-59 ml/min 01/24/2013    Baseline creatinine roughly 1.7-1.8.   History of TIA (transient ischemic attack) 2005    Carotid Dopplers January 2014 negative for significant stenosis.   H/O right and left cardiac catheterization November 2009    30-40% RCA disease, cardiac output Fick 6.3, thermodilution 5.3.  Normal artery pressures.   History of Non-ischemic cardiomyopathy 2009 - 2014    EF previously as low as 35-40% in 2009, up to 40 and 45% by 2012.  Repeat echo January 2014: Echo EF 55-60% mild LVH, mild to moderate left atrial enlargement, mild right atrial enlargement.   Atrial fibrillation, permanent 01/24/2013   chronic,previuosly refuses coumadin,now on xarelto   OSA on CPAP     use DME- Choice titration study was on 02/21/13    Home Meds: Prior to Admission medications   Medication Sig Start Date End Date Taking? Authorizing Provider  cyclobenzaprine (FLEXERIL) 10 MG tablet Take 1 tablet (10 mg total) by mouth 3 (three) times daily as needed for muscle spasms. 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 01/31/15  Yes Leonie Man, MD  Multiple Vitamin (MULTIVITAMIN WITH MINERALS) TABS Take 1 tablet by mouth daily.   Yes Historical Provider, MD  nebivolol (BYSTOLIC) 10 MG tablet Take 1 tablet (10 mg total) by mouth daily. 02/05/15  Yes Leonie Man, MD  rivaroxaban (XARELTO) 20 MG TABS tablet Take 1 tablet (20 mg total) by mouth daily with supper. 01/21/15  Yes Leonie Man, MD   Allergies:  Allergies  Allergen Reactions   Ace Inhibitors Swelling    angioedema   Codeine Itching   History   Social History   Marital Status: Divorced    Spouse Name: N/A   Number of Children: N/A   Years of Education: N/A   Occupational History   Not on file.   Social History Main Topics   Smoking  status: Never Smoker    Smokeless tobacco: Never Used   Alcohol Use: No   Drug Use: No   Sexual Activity: Yes     Comment: married   Other Topics Concern   Not on file   Social History Narrative    Review of Systems: Constitutional: negative for chills, fever, night sweats, weight changes, or fatigue  HEENT: negative for vision changes, hearing loss, congestion, rhinorrhea, ST, epistaxis, or sinus pressure Cardiovascular: negative for chest pain or palpitations Respiratory: negative for hemoptysis, wheezing, shortness of breath, or cough Abdominal: negative for abdominal pain, nausea, vomiting, diarrhea, or constipation Msk: Arthralgias, joint  swelling. Dermatological: negative for rash Neurologic: negative for headache, dizziness, or syncope All other systems reviewed and are otherwise negative with the exception to those above and in the HPI.  Physical Exam: Blood pressure 146/94, pulse 88, temperature 98.2 F (36.8 C), resp. rate 18, SpO2 98 %., There is no weight on file to calculate BMI. General: Well developed, well nourished, in no acute distress. Using a crutch to for assistance. Head: Normocephalic, atraumatic, eyes without discharge, sclera non-icteric, nares are without discharge. Bilateral auditory canals clear, TM's are without perforation, pearly grey and translucent with reflective cone of light bilaterally. Oral cavity moist, posterior pharynx without exudate, erythema, peritonsillar abscess, or post nasal drip.  Neck: Supple. No thyromegaly. Full ROM. No lymphadenopathy. Lungs: Clear bilaterally to auscultation without wheezes, rales, or rhonchi. Breathing is unlabored. Heart: RRR with S1 S2. No murmurs, rubs, or gallops appreciated. Abdomen: Soft, non-tender, non-distended with normoactive bowel sounds. No hepatomegaly. No rebound/guarding. No obvious abdominal masses. Msk:  Strength and tone normal for age. Extremities/Skin: Warm and dry. No clubbing or cyanosis. No edema. No rashes or suspicious lesions. Tender left anterior ankle and left greater toe. Mild swelling of the left ankle on the left side Neuro: Alert and oriented X 3. Moves all extremities spontaneously. Gait is normal. CNII-XII grossly in tact. Psych:  Responds to questions appropriately with a normal affect.   Labs:  ASSESSMENT AND PLAN:  54 y.o. year old male with  This chart was scribed in my presence and reviewed by me personally.    ICD-9-CM ICD-10-CM   1. Acute gout of left ankle, unspecified cause 274.01 M10.072 predniSONE (DELTASONE) 20 MG tablet     DISCONTINUED: predniSONE (DELTASONE) 20 MG tablet     Signed, Robyn Haber,  MD  Signed, Robyn Haber, MD 04/20/2015 9:14 AM

## 2015-04-27 ENCOUNTER — Ambulatory Visit (INDEPENDENT_AMBULATORY_CARE_PROVIDER_SITE_OTHER): Payer: BLUE CROSS/BLUE SHIELD | Admitting: Emergency Medicine

## 2015-04-27 VITALS — BP 140/90 | HR 98 | Temp 97.8°F | Resp 16 | Ht 73.0 in | Wt 233.0 lb

## 2015-04-27 DIAGNOSIS — I482 Chronic atrial fibrillation, unspecified: Secondary | ICD-10-CM

## 2015-04-27 DIAGNOSIS — M10072 Idiopathic gout, left ankle and foot: Secondary | ICD-10-CM | POA: Diagnosis not present

## 2015-04-27 DIAGNOSIS — M109 Gout, unspecified: Secondary | ICD-10-CM

## 2015-04-27 MED ORDER — INDOMETHACIN 25 MG PO CAPS: 25.0000 mg | ORAL_CAPSULE | Freq: Two times a day (BID) | ORAL | Status: DC

## 2015-04-27 MED ORDER — COLCHICINE 0.6 MG PO TABS: ORAL_TABLET | ORAL | Status: DC

## 2015-04-27 NOTE — Progress Notes (Signed)
[facility] [address], [place] [address] [phone]  Date:  04/27/2015   Name:  Isaiah [name]   DOB:  Nov 10, 1961   MRN:  [id number]  PCP:  Jenny Reichmann, MD    Chief Complaint: Gout   History of Present Illness:  Isaiah [name] is a 54 y.o. very pleasant male patient who presents with the following:  History of gout.  Treated a week ago with prednisone for a flare in the left ankle. Had transient improvement and now has in left knee and great toe.  Also in right great toe. Some low back.  Non radiating and no neuro symptoms. No injury or overuse.  No fever or chills No improvement with over the counter medications or other home remedies.  Denies other complaint or health concern today.   Patient Active Problem List   Diagnosis Date Noted  . Drug therapy 02/02/2015  . Dyslipidemia 02/02/2015  . Obesity (BMI 30-39.9) 09/10/2013  . Gout 03/09/2013  . Terminal aortic occlusion 01/24/2013  . Atrial fibrillation, permanent 01/24/2013  . Essential hypertension 01/24/2013  . Erectile dysfunction 01/24/2013  . CKD (chronic kidney disease) stage 3, GFR 30-59 ml/min 01/24/2013  . CAD (coronary artery disease), non obstructive by cardiac cath 2009 01/24/2013    Past Medical History  Diagnosis Date  . Hypertension   . Gout   . Pinched nerve in neck   . Extremity ischemia, critical bil lower ext. 01/24/2013    Status post bilateral common femoral, profunda femoral and superficial femoral arterial embolectomy  . Terminal aortic occlusion 01/24/2013    Status post embolectomy  . HTN (hypertension)   . Erectile dysfunction   . CKD (chronic kidney disease) stage 3, GFR 30-59 ml/min 01/24/2013    Baseline creatinine roughly 1.7-1.8.  . History of TIA (transient ischemic attack) 2005    Carotid Dopplers January 2014 negative for significant stenosis.  . H/O right and left cardiac catheterization November 2009    30-40% RCA disease, cardiac output Fick 6.3,  thermodilution 5.3.  Normal artery pressures.  . History of Non-ischemic cardiomyopathy 2009 - 2014    EF previously as low as 35-40% in 2009, up to 40 and 45% by 2012.  Repeat echo January 2014: Echo EF 55-60% mild LVH, mild to moderate left atrial enlargement, mild right atrial enlargement.  . Atrial fibrillation, permanent 01/24/2013    chronic,previuosly refuses coumadin,now on xarelto  . OSA on CPAP     use DME- Choice titration study was on 02/21/13    Past Surgical History  Procedure Laterality Date  . Knee surgery    . Embolectomy  01/23/2013    Procedure: EMBOLECTOMY;  Surgeon: Serafina Mitchell, MD;  [facility]: [facility];  Service: Vascular;  Laterality: Bilateral;  Bilateral femoral Embolectomy, Bilateral Iliac Embolectomy.  . Cardiac catheterization  Nov 2009    right and left  heart cath ,cardiac output 6.3 by FICK AND 5.34 by thermal  diluation.norm RV pressures and nonobstructive cor disease. no shunt.some intramyocardial bridgingof the LAD  . Carotid dopplers  Nov 2009    done for TIA which were normal  . Doppler echocardiography  02/2011; 12/2012    a) EF40-45%; LA mod to severe dilated ;; b) EF 55-60%, mild LVH, Mild-Mod LA dilation    History  Substance Use Topics  . Smoking status: Never Smoker   . Smokeless tobacco: Never Used  . Alcohol Use: No    Family History  Problem Relation  Age of Onset  . Hypertension Mother   . Hypertension Father     Allergies  Allergen Reactions  . Ace Inhibitors Swelling    angioedema  . Codeine Itching    Medication list has been reviewed and updated.  Current Outpatient Prescriptions on File Prior to Visit  Medication Sig Dispense Refill  . Multiple Vitamin (MULTIVITAMIN WITH MINERALS) TABS Take 1 tablet by mouth daily.    . nebivolol (BYSTOLIC) 10 MG tablet Take 1 tablet (10 mg total) by mouth daily. 30 tablet 11  . rivaroxaban (XARELTO) 20 MG TABS tablet Take 1 tablet (20 mg total) by mouth daily with supper. 30 tablet 5  .  cyclobenzaprine (FLEXERIL) 10 MG tablet Take 1 tablet (10 mg total) by mouth 3 (three) times daily as needed for muscle spasms. 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    Review of Systems:  Review of Systems  Constitutional: Negative for fever, chills and fatigue.  HENT: Negative for congestion, ear pain, hearing loss, postnasal drip, rhinorrhea and sinus pressure.   Eyes: Negative for discharge and redness.  Respiratory: Negative for cough, shortness of breath and wheezing.   Cardiovascular: Negative for chest pain and leg swelling.  Gastrointestinal: Negative for nausea, vomiting, abdominal pain, constipation and blood in stool.  Genitourinary: Negative for dysuria, urgency and frequency.  Musculoskeletal: Negative for neck stiffness.  Skin: Negative for rash.  Neurological: Negative for seizures, weakness and headaches.     Physical Examination: Filed Vitals:   04/27/15 0921  BP: 140/90  Pulse: 98  Temp: 97.8 F (36.6 C)  Resp: 16   Filed Vitals:   04/27/15 0921  Height: 6\' 1"  (1.854 m)  Weight: 233 lb (105.688 kg)   Body mass index is 30.75 kg/(m^2). Ideal Body Weight: Weight in (lb) to have BMI = 25: 189.1   GEN: WDWN, NAD, Non-toxic, Alert & Oriented x 3 HEENT: Atraumatic, Normocephalic.  Ears and Nose: No external deformity. EXTR: No clubbing/cyanosis/edema NEURO: marked antalgic gait.  PSYCH: Normally interactive. Conversant. Not depressed or anxious appearing.  Calm demeanor.  LEFT MTP tender and swollen.  red  Assessment and Plan: Gout flare Indocin Colchicine Hold xarelto 2-3 days  Signed Ellison Carwin, MD

## 2015-04-27 NOTE — Patient Instructions (Signed)

## 2015-04-30 ENCOUNTER — Telehealth: Payer: Self-pay

## 2015-04-30 NOTE — Telephone Encounter (Signed)
Pt dropped off FMLA ppw on 04/30/15 for completion by Dr.Anderson in 5-7 business days. Once Completed please return to disability box located by checkout at [address]. Blank copies have been scanned into chart under release [id number]. Once returned Pierce or myself will scan completed forms into Release [id number], fufill release, and contact patient for pick up.

## 2015-05-06 NOTE — Telephone Encounter (Signed)
Patient called to check the status of his FMLA forms

## 2015-05-06 NOTE — Telephone Encounter (Signed)
I do not have them.  I have completed all papers delivered

## 2015-05-07 NOTE — Telephone Encounter (Signed)
No I don't have these forms Jasmine.

## 2015-05-07 NOTE — Telephone Encounter (Signed)
Was it placed in the FMLA/Disabilities tray at checkout, given to a CMA or placed in the box at the nurse's station? Our department has not received the completed paperwork yet.

## 2015-05-13 NOTE — Telephone Encounter (Signed)
Scanned paperwork into Epic, called and notified patient ready for pick up

## 2015-07-05 ENCOUNTER — Other Ambulatory Visit: Payer: Self-pay | Admitting: Emergency Medicine

## 2015-07-07 ENCOUNTER — Ambulatory Visit (INDEPENDENT_AMBULATORY_CARE_PROVIDER_SITE_OTHER): Payer: BLUE CROSS/BLUE SHIELD | Admitting: Emergency Medicine

## 2015-07-07 VITALS — BP 161/113 | HR 65 | Temp 97.8°F | Resp 15 | Ht 73.0 in | Wt 231.0 lb

## 2015-07-07 DIAGNOSIS — M25579 Pain in unspecified ankle and joints of unspecified foot: Secondary | ICD-10-CM | POA: Diagnosis not present

## 2015-07-07 DIAGNOSIS — M1000 Idiopathic gout, unspecified site: Secondary | ICD-10-CM | POA: Diagnosis not present

## 2015-07-07 MED ORDER — COLCHICINE 0.6 MG PO TABS: ORAL_TABLET | ORAL | Status: DC

## 2015-07-07 NOTE — Patient Instructions (Signed)

## 2015-07-07 NOTE — Progress Notes (Addendum)
   Subjective:  This chart was scribed for Nena Jordan, MD by [facility], medical scribe at [facility].The patient was seen in exam room 07 and the patient's care was started at 10:04 AM.   Patient ID: Isaiah [name], male    DOB: 12-20-1961, 54 y.o.   MRN: [id number] Chief Complaint  Patient presents with  . Gout    Onset 3 days/ Right foot  . Leg Injury    Right leg/ fell off porch yesterday   HPI HPI Comments: Isaiah [name] is a 54 y.o. male with a history of gout who presents to [facility] complaining of a gout flare up at the right great toe, onset three days ago. While on crutches he fell off his porch yesterday and injured his right ankle. Pt has not taken any medication for his gout flare up. He denies calf pain. He works of [employer].  Review of Systems  Musculoskeletal: Positive for joint swelling, arthralgias and gait problem.     Objective:  Blood pressure 161/113, pulse 65, temperature 97.8 F (36.6 C), temperature source Oral, resp. rate 15, height 6\' 1"  (1.854 m), weight 231 lb (104.781 kg), SpO2 94 %. Physical Exam CONSTITUTIONAL: Well developed/well nourished HEAD: Normocephalic/atraumatic EYES: EOMI/PERRL ENMT: Mucous membranes moist NECK: supple no meningeal signs SPINE/BACK:entire spine nontender CV: S1/S2 noted, no murmurs/rubs/gallops noted LUNGS: Lungs are clear to auscultation bilaterally, no apparent distress ABDOMEN: soft, nontender, no rebound or guarding, bowel sounds noted throughout abdomen GU:no cva tenderness NEURO: Pt is awake/alert/appropriate, moves all extremitiesx4.  No facial droop.   EXTREMITIES: pulses normal/equal, full ROM., tenderness of the greater toe and lateral portion of the ankle and mild tenderness at the achillis attachment to his heel. SKIN: warm, color normal PSYCH: no abnormalities of mood noted, alert and oriented to situation     Assessment & Plan:  Patient's blood pressure was up  today. He states his pharmacy has been out of the medication and should be in at 5:00 today. Will treat with colchicine  3 times a day until better and then twice a day. We'll recheck in one week. He is taken out of work. He also appears to have an Achilles injury need to recheck this on his follow-up visit in one week.I personally performed the services described in this documentation, which was scribed in my presence. The recorded information has been reviewed and is accurate. I decided not to Doppler his leg because he is a already on Xarelto.  Nena Jordan, MD

## 2015-07-11 ENCOUNTER — Ambulatory Visit (INDEPENDENT_AMBULATORY_CARE_PROVIDER_SITE_OTHER): Payer: BLUE CROSS/BLUE SHIELD | Admitting: Physician Assistant

## 2015-07-11 VITALS — BP 132/86 | HR 93 | Temp 98.3°F | Resp 18 | Ht 72.5 in | Wt 223.0 lb

## 2015-07-11 DIAGNOSIS — M10071 Idiopathic gout, right ankle and foot: Secondary | ICD-10-CM | POA: Diagnosis not present

## 2015-07-11 DIAGNOSIS — M109 Gout, unspecified: Secondary | ICD-10-CM

## 2015-07-11 MED ORDER — ALLOPURINOL 100 MG PO TABS: 100.0000 mg | ORAL_TABLET | Freq: Every day | ORAL | Status: DC

## 2015-07-11 MED ORDER — PREDNISONE 10 MG PO TABS: ORAL_TABLET | ORAL | Status: AC

## 2015-07-11 MED ORDER — COLCHICINE 0.6 MG PO TABS: ORAL_TABLET | ORAL | Status: DC

## 2015-07-11 NOTE — Patient Instructions (Signed)
Colchicine - use when you have a flair up and only then  Allopurinol - this is a medication to prevent your gout from starting - you will take this daily and you will start this after you have been pain free for 2 weeks. - please come back in a month to have your uric acid level checked to make sure it is working  For this episode we will start you on prednisone to stop the pain

## 2015-07-11 NOTE — Progress Notes (Signed)
Isaiah [name]  MRN: [id number] DOB: 11/17/1961  Subjective:  Pt presents to clinic for recheck of his gout.  The pain is still present in his lateral foot and 1st MTP joint.  He is no longer having to use crutches but he is still having a lot of pain.  The colchicine does not work as well as the indocin but he is unable to be on that medication any longer because of his Bynum Bellows use.  He does not want pain medication.  His heel is better from his injury last week and he has no pain with that.   Patient Active Problem List   Diagnosis Date Noted  . Drug therapy 02/02/2015  . Dyslipidemia 02/02/2015  . Obesity (BMI 30-39.9) 09/10/2013  . Gout 03/09/2013  . Terminal aortic occlusion 01/24/2013  . Atrial fibrillation, permanent 01/24/2013  . Essential hypertension 01/24/2013  . Erectile dysfunction 01/24/2013  . CKD (chronic kidney disease) stage 3, GFR 30-59 ml/min 01/24/2013  . CAD (coronary artery disease), non obstructive by cardiac cath 2009 01/24/2013    Current Outpatient Prescriptions on File Prior to Visit  Medication Sig Dispense Refill  . Multiple Vitamin (MULTIVITAMIN WITH MINERALS) TABS Take 1 tablet by mouth daily.    . nebivolol (BYSTOLIC) 10 MG tablet Take 1 tablet (10 mg total) by mouth daily. 30 tablet 11  . rivaroxaban (XARELTO) 20 MG TABS tablet Take 1 tablet (20 mg total) by mouth daily with supper. 30 tablet 5  . cyclobenzaprine (FLEXERIL) 10 MG tablet Take 1 tablet (10 mg total) by mouth 3 (three) times daily as needed for muscle spasms. 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    Allergies  Allergen Reactions  . Ace Inhibitors Swelling    angioedema  . Codeine Itching    Review of Systems  Musculoskeletal: Positive for gait problem (2nd to pain).   Objective:  BP 132/86 mmHg  Pulse 93  Temp(Src) 98.3 F  (36.8 C)  Resp 18  Ht 6' 0.5" (1.842 m)  Wt 223 lb (101.152 kg)  BMI 29.81 kg/m2  SpO2 97%  Physical Exam  Constitutional: He is oriented to person, place, and time and well-developed, well-nourished, and in no distress.  HENT:  Head: Normocephalic and atraumatic.  Right Ear: External ear normal.  Left Ear: External ear normal.  Eyes: Conjunctivae are normal.  Neck: Normal range of motion.  Pulmonary/Chest: Effort normal.  Musculoskeletal:       Left foot: Normal.       Feet:  Neurological: He is alert and oriented to person, place, and time. Gait normal.  Skin: Skin is warm and dry.  Psychiatric: Mood, memory, affect and judgment normal.    Assessment and Plan :  Acute gout of right foot, unspecified cause - Plan: allopurinol (ZYLOPRIM) 100 MG tablet, colchicine 0.6 MG tablet, predniSONE (DELTASONE) 10 MG tablet   Pt has missed work due to the pain and a note was written for him to be out of work through next week.  We will start him on prednisone due to the length of time of his gout attack.  He is interested in started daily preventative medication because he is having an attack about every 3 months - and they seem to be getting worse each time he gets one.  He will RTC in 1 month to have his uric acid  level checked with a goal of <6.  Windell Hummingbird PA-C  [facility] 07/11/2015 9:17 AM

## 2015-07-14 ENCOUNTER — Telehealth: Payer: Self-pay | Admitting: Physician Assistant

## 2015-07-14 NOTE — Telephone Encounter (Signed)
Patient dropped off an Attending Physician States and Job Function Evaluation Form for Isaiah [name] to complete. Patient has previously had FMLA completed by her. Please return to FMLA/Disability department within 5-7 business days. Placed in your box on 07/14/2015.

## 2015-07-16 NOTE — Telephone Encounter (Signed)
Done

## 2015-07-17 DIAGNOSIS — Z0271 Encounter for disability determination: Secondary | ICD-10-CM

## 2015-07-17 NOTE — Telephone Encounter (Signed)
Patient picked up forms on 07/17/2015

## 2015-07-17 NOTE — Telephone Encounter (Signed)
Called patient and let him know that his paperwork was ready to be picked up and would be in the pick up draw at 102.

## 2015-07-21 ENCOUNTER — Ambulatory Visit (INDEPENDENT_AMBULATORY_CARE_PROVIDER_SITE_OTHER): Payer: BLUE CROSS/BLUE SHIELD

## 2015-07-21 ENCOUNTER — Ambulatory Visit (INDEPENDENT_AMBULATORY_CARE_PROVIDER_SITE_OTHER): Payer: BLUE CROSS/BLUE SHIELD | Admitting: Family Medicine

## 2015-07-21 VITALS — BP 108/80 | HR 77 | Temp 98.0°F | Resp 16 | Ht 73.0 in | Wt 227.0 lb

## 2015-07-21 DIAGNOSIS — M10071 Idiopathic gout, right ankle and foot: Secondary | ICD-10-CM

## 2015-07-21 DIAGNOSIS — I2510 Atherosclerotic heart disease of native coronary artery without angina pectoris: Secondary | ICD-10-CM | POA: Diagnosis not present

## 2015-07-21 DIAGNOSIS — I7409 Other arterial embolism and thrombosis of abdominal aorta: Secondary | ICD-10-CM

## 2015-07-21 DIAGNOSIS — N183 Chronic kidney disease, stage 3 unspecified: Secondary | ICD-10-CM

## 2015-07-21 DIAGNOSIS — I482 Chronic atrial fibrillation: Secondary | ICD-10-CM | POA: Diagnosis not present

## 2015-07-21 DIAGNOSIS — I4821 Permanent atrial fibrillation: Secondary | ICD-10-CM

## 2015-07-21 LAB — POCT CBC
GRANULOCYTE PERCENT: 62.8 % (ref 37–80)
HEMATOCRIT: 49.2 % (ref 43.5–53.7)
Hemoglobin: 15.6 g/dL (ref 14.1–18.1)
Lymph, poc: 3 (ref 0.6–3.4)
MCH, POC: 31.3 pg — AB (ref 27–31.2)
MCHC: 31.8 g/dL (ref 31.8–35.4)
MCV: 98.6 fL — AB (ref 80–97)
MID (cbc): 0.4 (ref 0–0.9)
MPV: 8.4 fL (ref 0–99.8)
POC Granulocyte: 5.8 (ref 2–6.9)
POC LYMPH PERCENT: 32.6 %L (ref 10–50)
POC MID %: 4.6 % (ref 0–12)
Platelet Count, POC: 199 10*3/uL (ref 142–424)
RBC: 4.99 M/uL (ref 4.69–6.13)
RDW, POC: 14.9 %
WBC: 9.3 10*3/uL (ref 4.6–10.2)

## 2015-07-21 MED ORDER — PREDNISONE 20 MG PO TABS: ORAL_TABLET | ORAL | Status: DC

## 2015-07-21 NOTE — Progress Notes (Signed)
Subjective:    Patient ID: SKY KIRSHENBAUM, male    DOB: [birth year], 54 y.o.   MRN: [id number]  07/21/2015  Gout   HPI This 54 y.o. male presents for evaluation of persistent gouty attack.   Ten day follow-up.  Foot pain is better.  Was 12/10 pain and now 6/10.  Warm to touch.  Still unable to put foot intol shoe.  Just started Allopurinol yesterday.  Completed Prednisone; ran out two days ago; Colchicine ran out four days ago.  Has been two weeks of acute issue.  Swelling is mild.  To return to work Midwife; needs to return to work.  Works [employer] makes pain; wears steel closed toe shoes; a lot of lifting and walking up stairs.  With onset of gout, coming down stair, heel hit; no heel pain.  To return to work Midwife.     Review of Systems  Constitutional: Negative for fever, chills, diaphoresis and fatigue.  Musculoskeletal: Positive for joint swelling, arthralgias and gait problem.  Skin: Negative for color change, pallor, rash and wound.  Neurological: Positive for numbness. Negative for weakness.    Past Medical History  Diagnosis Date  . Hypertension   . Gout   . Pinched nerve in neck   . Extremity ischemia, critical bil lower ext. 01/24/2013    Status post bilateral common femoral, profunda femoral and superficial femoral arterial embolectomy  . Terminal aortic occlusion 01/24/2013    Status post embolectomy  . HTN (hypertension)   . Erectile dysfunction   . CKD (chronic kidney disease) stage 3, GFR 30-59 ml/min 01/24/2013    Baseline creatinine roughly 1.7-1.8.  . History of TIA (transient ischemic attack) 2005    Carotid Dopplers January 2014 negative for significant stenosis.  . H/O right and left cardiac catheterization November 2009    30-40% RCA disease, cardiac output Fick 6.3, thermodilution 5.3.  Normal artery pressures.  . History of Non-ischemic cardiomyopathy 2009 - 2014    EF previously as low as 35-40% in 2009, up to 40 and 45% by 2012.  Repeat echo January 2014:  Echo EF 55-60% mild LVH, mild to moderate left atrial enlargement, mild right atrial enlargement.  . Atrial fibrillation, permanent 01/24/2013    chronic,previuosly refuses coumadin,now on xarelto  . OSA on CPAP     use DME- Choice titration study was on 02/21/13   Past Surgical History  Procedure Laterality Date  . Knee surgery    . Embolectomy  01/23/2013    Procedure: EMBOLECTOMY;  Surgeon: Serafina Mitchell, MD;  Location: [facility] OR;  Service: Vascular;  Laterality: Bilateral;  Bilateral femoral Embolectomy, Bilateral Iliac Embolectomy.  . Cardiac catheterization  Nov 2009    right and left  heart cath ,cardiac output 6.3 by FICK AND 5.34 by thermal  diluation.norm RV pressures and nonobstructive cor disease. no shunt.some intramyocardial bridgingof the LAD  . Carotid dopplers  Nov 2009    done for TIA which were normal  . Doppler echocardiography  02/2011; 12/2012    a) EF40-45%; LA mod to severe dilated ;; b) EF 55-60%, mild LVH, Mild-Mod LA dilation   Allergies  Allergen Reactions  . Ace Inhibitors Swelling    angioedema  . Codeine Itching   Current Outpatient Prescriptions  Medication Sig Dispense Refill  . allopurinol (ZYLOPRIM) 100 MG tablet Take 1 tablet (100 mg total) by mouth daily. 30 tablet 1  . colchicine 0.6 MG tablet When you have a gout flair - 1 po tid until  improved and then 1 twice a day until resolved 30 tablet 2  . Multiple Vitamin (MULTIVITAMIN WITH MINERALS) TABS Take 1 tablet by mouth daily.    . nebivolol (BYSTOLIC) 10 MG tablet Take 1 tablet (10 mg total) by mouth daily. 30 tablet 11  . rivaroxaban (XARELTO) 20 MG TABS tablet Take 1 tablet (20 mg total) by mouth daily with supper. 30 tablet 5  . cyclobenzaprine (FLEXERIL) 10 MG tablet Take 1 tablet (10 mg total) by mouth 3 (three) times daily as needed for muscle spasms. 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 predniSONE (DELTASONE) 20  MG tablet Three tablets daily x 2 days then two tablets daily x 5 days then one tablet daily x 5 days 21 tablet 0   No current facility-administered medications for this visit.   History   Social History  . Marital Status: Divorced    Spouse Name: N/A  . Number of Children: N/A  . Years of Education: N/A   Occupational History  . Not on file.   Social History Main Topics  . Smoking status: Never Smoker   . Smokeless tobacco: Never Used  . Alcohol Use: No  . Drug Use: No  . Sexual Activity: Yes     Comment: married   Other Topics Concern  . Not on file   Social History Narrative        Objective:    BP 108/80 mmHg  Pulse 77  Temp(Src) 98 F (36.7 C) (Oral)  Resp 16  Ht 6\' 1"  (1.854 m)  Wt 227 lb (102.967 kg)  BMI 29.96 kg/m2  SpO2 96% Physical Exam  Constitutional: He is oriented to person, place, and time. He appears well-developed and well-nourished. No distress.  HENT:  Head: Normocephalic and atraumatic.  Eyes: Conjunctivae and EOM are normal. Pupils are equal, round, and reactive to light.  Neck: Normal range of motion. Neck supple. Carotid bruit is not present. No thyromegaly present.  Cardiovascular: Normal rate, regular rhythm, normal heart sounds and intact distal pulses.  Exam reveals no gallop and no friction rub.   No murmur heard. Pulmonary/Chest: Effort normal and breath sounds normal. He has no wheezes. He has no rales.  Musculoskeletal:       Right foot: There is decreased range of motion and tenderness.  Lymphadenopathy:    He has no cervical adenopathy.  Neurological: He is alert and oriented to person, place, and time. No cranial nerve deficit.  Skin: Skin is warm and dry. No rash noted. He is not diaphoretic.  Psychiatric: He has a normal mood and affect. His behavior is normal.  Nursing note and vitals reviewed.  [facility] reading (PRIMARY) by  Dr. Tamala Julian.  R FOOT FILMS: arthritic changes first MTP.  No acute process.       Assessment &  Plan:   1. Acute idiopathic gout of right foot   2. Chronic renal insufficiency, stage 3 (moderate)   3. Atrial fibrillation, permanent   4. Coronary artery disease involving native coronary artery of native heart without angina pectoris   5. Terminal aortic occlusion     -Improving but persistent. -NSAIDs contraindicated due to renal insufficiency and Xarelto use. -Refill of Prednisone provided. -[facility] note provided. -Obtain labs. -Continue Allopurinol at current dose; will not tolerate up-titration of Allopurinol due to renal insufficiency.   Meds ordered this encounter  Medications  . predniSONE (DELTASONE) 20 MG tablet  Sig: Three tablets daily x 2 days then two tablets daily x 5 days then one tablet daily x 5 days    Dispense:  21 tablet    Refill:  0    No Follow-up on file.     Gordy Goar Elayne Guerin, M.D. [facility] [address] [place], [place]  [address] [phone] phone [phone] fax

## 2015-07-21 NOTE — Patient Instructions (Signed)

## 2015-07-22 LAB — COMPREHENSIVE METABOLIC PANEL
ALT: 29 U/L (ref 9–46)
AST: 22 U/L (ref 10–35)
Albumin: 3.9 g/dL (ref 3.6–5.1)
Alkaline Phosphatase: 76 U/L (ref 40–115)
BILIRUBIN TOTAL: 0.6 mg/dL (ref 0.2–1.2)
BUN: 25 mg/dL (ref 7–25)
CO2: 26 mEq/L (ref 20–31)
Calcium: 11.5 mg/dL — ABNORMAL HIGH (ref 8.6–10.3)
Chloride: 100 mEq/L (ref 98–110)
Creat: 1.9 mg/dL — ABNORMAL HIGH (ref 0.70–1.33)
Glucose, Bld: 102 mg/dL — ABNORMAL HIGH (ref 65–99)
Potassium: 4.6 mEq/L (ref 3.5–5.3)
Sodium: 135 mEq/L (ref 135–146)
Total Protein: 7 g/dL (ref 6.1–8.1)

## 2015-07-22 LAB — URIC ACID: URIC ACID, SERUM: 8.5 mg/dL — AB (ref 4.0–7.8)

## 2015-07-29 NOTE — Addendum Note (Signed)
Addended by: Wardell Honour on: 07/29/2015 03:10 PM   Modules accepted: Orders

## 2015-07-30 ENCOUNTER — Telehealth: Payer: Self-pay

## 2015-07-30 ENCOUNTER — Ambulatory Visit (INDEPENDENT_AMBULATORY_CARE_PROVIDER_SITE_OTHER): Payer: BLUE CROSS/BLUE SHIELD | Admitting: Family Medicine

## 2015-07-30 VITALS — BP 132/80 | HR 78 | Temp 98.4°F | Resp 17 | Ht 72.5 in | Wt 224.0 lb

## 2015-07-30 DIAGNOSIS — M10071 Idiopathic gout, right ankle and foot: Secondary | ICD-10-CM | POA: Diagnosis not present

## 2015-07-30 DIAGNOSIS — M109 Gout, unspecified: Secondary | ICD-10-CM

## 2015-07-30 MED ORDER — COLCHICINE 0.6 MG PO TABS: ORAL_TABLET | ORAL | Status: DC

## 2015-07-30 NOTE — Patient Instructions (Signed)

## 2015-07-30 NOTE — Progress Notes (Signed)
   Subjective:    Patient ID: Isaiah [name], male    DOB: [birth year], 54 y.o.   MRN: [id number]  HPI Patient presents for gout flare that has been present for the past 2 days in same location that only marginally got better from last gout flare one month ago. Big toe of right foot affect and is now experiencing numbness in addition to pain symptoms. Returned to work 2 days ago when he was feeling better, but by the end of his shift his toe was painful again. 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 No light duty option and is requesting FMLA. Just started allopurinol 2 days ago. Was no longer using crutches to walk. Previously had to use prednisone to help with flare.   Unable to use indomethacin due to Arnold Line use and kidney fx. H/o renal insufficiency.   Med allergy ACE-I and codeine. Review of Systems  Constitutional: Negative for fever and chills.  Musculoskeletal: Positive for joint swelling, arthralgias and gait problem.  Skin: Negative for color change.  Neurological: Positive for numbness. Negative for weakness.       Objective:   Physical Exam  Constitutional: He is oriented to person, place, and time. He appears well-developed and well-nourished. No distress.  Blood pressure 132/80, pulse 78, temperature 98.4 F (36.9 C), temperature source Oral, resp. rate 17, height 6' 0.5" (1.842 m), weight 224 lb (101.606 kg), SpO2 98 %.  HENT:  Head: Normocephalic and atraumatic.  Right Ear: External ear normal.  Left Ear: External ear normal.  Eyes: Conjunctivae are normal. Right eye exhibits no discharge. Left eye exhibits no discharge. No scleral icterus.  Pulmonary/Chest: Effort normal.  Musculoskeletal: He exhibits tenderness. He exhibits no edema.       Right foot: There is decreased range of motion (secondary to pain), tenderness, bony tenderness and swelling. There is normal capillary refill and no deformity.       Feet:  Neurological: He is  alert and oriented to person, place, and time. He displays no atrophy. No sensory deficit. He exhibits normal muscle tone.  Skin: Skin is warm and dry. No rash noted. He is not diaphoretic. There is erythema. No pallor.  Psychiatric: He has a normal mood and affect. His behavior is normal. Judgment and thought content normal.       Assessment & Plan:  1. Acute gout of right foot, unspecified cause Continue allopurinol. RTC for repeat uric acid and creatinine labs if have not seen rheumatologist at that time. Try to keep weight off foot and may use crutches as needed. Note for work given.  - colchicine 0.6 MG tablet; When you have a gout flair - 1 po tid until improved and then 1 twice a day until resolved  Dispense: 30 tablet; Refill: 2 - Ambulatory referral to Rheumatology   Alveta Heimlich PA-C  [facility] 07/30/2015 9:27 AM

## 2015-07-30 NOTE — Telephone Encounter (Signed)
Patient brought in a Blank attending Physicians statement to be completed by Dr. Tamala Julian. I have placed the papers in Dr. Thompson Caul box on 07/30/15 please return to [facility] box at checkout at 102. It appears that the patient has not been seen since 07/21/15 and the company is wanting to know if he needs to be out of work longer than what was on the recent attending physicians statement. I have included the previous paperwork as well.

## 2015-07-30 NOTE — Progress Notes (Deleted)
Subjective:     Patient ID: Isaiah [name], male   DOB: September 21, 1961, 54 y.o.   MRN: [id number]  HPI   Review of Systems     Objective:   Physical Exam     Assessment:     ***    Plan:     ***

## 2015-08-08 NOTE — Telephone Encounter (Signed)
How far along are we with patient's FMLA forms? Today marks the 7th business day.

## 2015-08-08 NOTE — Telephone Encounter (Signed)
I completed documentation and placed in faxing area at 102 at front desk to be faxed yesterday, 08/07/2015.  Forms should have been faxed and sent for scanning on 08/08/15.

## 2015-08-08 NOTE — Telephone Encounter (Signed)
Patient called requesting the status of his FMLA forms he dropped off. Please call him at [phone]

## 2015-08-12 DIAGNOSIS — Z0271 Encounter for disability determination: Secondary | ICD-10-CM

## 2015-09-30 ENCOUNTER — Encounter: Payer: Self-pay | Admitting: Emergency Medicine

## 2015-10-06 ENCOUNTER — Other Ambulatory Visit: Payer: Self-pay | Admitting: Occupational Medicine

## 2015-10-06 ENCOUNTER — Ambulatory Visit: Payer: Self-pay

## 2015-10-06 DIAGNOSIS — M25532 Pain in left wrist: Secondary | ICD-10-CM

## 2015-11-24 ENCOUNTER — Ambulatory Visit (INDEPENDENT_AMBULATORY_CARE_PROVIDER_SITE_OTHER): Payer: BLUE CROSS/BLUE SHIELD | Admitting: Family Medicine

## 2015-11-24 VITALS — BP 132/80 | HR 96 | Temp 98.7°F | Resp 18 | Ht 72.5 in | Wt 227.0 lb

## 2015-11-24 DIAGNOSIS — M10071 Idiopathic gout, right ankle and foot: Secondary | ICD-10-CM | POA: Diagnosis not present

## 2015-11-24 DIAGNOSIS — M10371 Gout due to renal impairment, right ankle and foot: Secondary | ICD-10-CM | POA: Diagnosis not present

## 2015-11-24 DIAGNOSIS — M109 Gout, unspecified: Secondary | ICD-10-CM

## 2015-11-24 MED ORDER — PREDNISONE 20 MG PO TABS: ORAL_TABLET | ORAL | Status: DC

## 2015-11-24 MED ORDER — ALLOPURINOL 100 MG PO TABS: 100.0000 mg | ORAL_TABLET | Freq: Every day | ORAL | Status: DC

## 2015-11-24 MED ORDER — METHYLPREDNISOLONE SODIUM SUCC 125 MG IJ SOLR
125.0000 mg | Freq: Once | INTRAMUSCULAR | Status: AC
  Administered 2015-11-24: 125 mg via INTRAMUSCULAR

## 2015-11-24 MED ORDER — COLCHICINE 0.6 MG PO TABS: ORAL_TABLET | ORAL | Status: DC

## 2015-11-24 NOTE — Progress Notes (Signed)
Subjective:    Patient ID: Isaiah [name], male    DOB: [birth year], 54 y.o.   MRN: [id number] This chart was scribed for Isaiah Cheadle, MD by Isaiah [name], Medical Scribe. This patient was seen in Room 7 and the patient's care was started at 9:06 AM.    Chief Complaint  Patient presents with  . Gout    flare saturday   . Foot Pain    rt     HPI HPI Comments: Isaiah [name] is a 54 y.o. male with a history of frequent gout reoccurrences who presents to the [facility] complaining of right foot pain that started 2 days ago, secondary to a gout flare-up. He is on allopurinol with last uric acid of 8. He is on Xarelto and has a history of kidney failure, NSAIDS contraindicated. He was put on prednisone. At last visit, he was tried on Colcrys and referred to rheumatology by Alveta Heimlich, PA-C. He was scheduled to see Dr. Kandice Robinsons in September.  Patient did see Dr. Kandice Robinsons and was on prednisone, colchicine, and allopurinol. He did well with the colchicine. He has run out of the allopurinol. He has his next appointment with Dr. Kandice Robinsons within the next few weeks.  Past Medical History  Diagnosis Date  . Hypertension   . Gout   . Pinched nerve in neck   . Extremity ischemia, critical bil lower ext. 01/24/2013    Status post bilateral common femoral, profunda femoral and superficial femoral arterial embolectomy  . Terminal aortic occlusion ([facility]) 01/24/2013    Status post embolectomy  . HTN (hypertension)   . Erectile dysfunction   . CKD (chronic kidney disease) stage 3, GFR 30-59 ml/min 01/24/2013    Baseline creatinine roughly 1.7-1.8.  . History of TIA (transient ischemic attack) 2005    Carotid Dopplers January 2014 negative for significant stenosis.  . H/O right and left cardiac catheterization November 2009    30-40% RCA disease, cardiac output Fick 6.3, thermodilution 5.3.  Normal artery pressures.  . History of Non-ischemic cardiomyopathy 2009 - 2014    EF previously as low  as 35-40% in 2009, up to 40 and 45% by 2012.  Repeat echo January 2014: Echo EF 55-60% mild LVH, mild to moderate left atrial enlargement, mild right atrial enlargement.  . Atrial fibrillation, permanent ([place]) 01/24/2013    chronic,previuosly refuses coumadin,now on xarelto  . OSA on CPAP     use DME- Choice titration study was on 02/21/13   Current Outpatient Prescriptions on File Prior to Visit  Medication Sig Dispense Refill  . nebivolol (BYSTOLIC) 10 MG tablet Take 1 tablet (10 mg total) by mouth daily. 30 tablet 11  . rivaroxaban (XARELTO) 20 MG TABS tablet Take 1 tablet (20 mg total) by mouth daily with supper. 30 tablet 5  . Multiple Vitamin (MULTIVITAMIN WITH MINERALS) TABS Take 1 tablet by mouth daily.     No current facility-administered medications on file prior to visit.   Allergies  Allergen Reactions  . Ace Inhibitors Swelling    angioedema  . Codeine Itching    Review of Systems  Constitutional: Positive for activity change. Negative for appetite change.  Cardiovascular: Positive for leg swelling.  Gastrointestinal: Negative for abdominal pain and diarrhea.  Musculoskeletal: Positive for myalgias, joint swelling, arthralgias and gait problem.  Skin: Positive for color change. Negative for pallor, rash and wound.  Neurological: Negative for weakness.  Hematological: Does not bruise/bleed easily.  Psychiatric/Behavioral: Positive for sleep  disturbance.       Objective:  BP 132/80 mmHg  Pulse 96  Temp(Src) 98.7 F (37.1 C) (Oral)  Resp 18  Ht 6' 0.5" (1.842 m)  Wt 227 lb (102.967 kg)  BMI 30.35 kg/m2  SpO2 97%  Physical Exam  Constitutional: He is oriented to person, place, and time. He appears well-developed and well-nourished. No distress.  HENT:  Head: Normocephalic and atraumatic.  Mouth/Throat: Oropharynx is clear and moist. No oropharyngeal exudate.  Eyes: Pupils are equal, round, and reactive to light.  Neck: Neck supple.  Cardiovascular: Normal  rate.   Pulses:      Dorsalis pedis pulses are 2+ on the right side.  Unable to palpate PT due to pain.  Pulmonary/Chest: Effort normal.  Musculoskeletal: He exhibits no edema.  Right foot: no significant erythema on foot, but some warmth over first MTP and medial malleolus as well as mid foot with severe hyperesthesia. Minimal edema. Minimal ROM.  Neurological: He is alert and oriented to person, place, and time. No cranial nerve deficit.  Skin: Skin is warm and dry. No rash noted.  Psychiatric: He has a normal mood and affect. His behavior is normal.  Nursing note and vitals reviewed.         Assessment & Plan:   1. Acute gout due to renal impairment involving right foot   2. Acute gout of right foot, unspecified cause   Pt has poor medication compliance - suspect this is due to low health literacy and misunderstanding of med use.  Has f/u with rheum Dr. Kandice Robinsons in 1 mo - instructed to start the allopurinol in 1 wk and stay on it - pt always goes off whenever his rx runs out. Pt requests out of work x 1 wk due to severity. RICE, using crutches prn. Pt aware of diet changes  Meds ordered this encounter  Medications  . methylPREDNISolone sodium succinate (SOLU-MEDROL) 125 mg/2 mL injection 125 mg    Sig:   . colchicine 0.6 MG tablet    Sig: When you have a gout flair - take 2 tabs po x 1 at first sign of onset, then 1 tab an hour later    Dispense:  30 tablet    Refill:  0  . predniSONE (DELTASONE) 20 MG tablet    Sig: Three tablets daily x 2 days then two tablets daily x 3 days then one tablet daily x 3 days, than 1/2 tab po qd x 2d    Dispense:  16 tablet    Refill:  0  . allopurinol (ZYLOPRIM) 100 MG tablet    Sig: Take 1 tablet (100 mg total) by mouth daily.    Dispense:  30 tablet    Refill:  1    I personally performed the services described in this documentation, which was scribed in my presence. The recorded information has been reviewed and considered, and addended by  me as needed.  Isaiah Cheadle, MD MPH  By signing my name below, I, Isaiah [name], attest that this documentation has been prepared under the direction and in the presence of Isaiah Cheadle, MD.  Electronically Signed: Zola [name], Medical Scribe. 11/24/2015. 9:06 AM.

## 2015-11-24 NOTE — Patient Instructions (Signed)
You have received a very strong injection of steroid today.  You should pick up the colchicine and take 2 tabs immediately and then 1 tab an hour later. This will likely cause diarrhea. Tomorrow morning start taking the prednisone taper with food.  Start the allopurinol in 1 week. Keep your appointment with Dr. Kandice Robinsons this month.  Gout Gout is an inflammatory arthritis caused by a buildup of uric acid crystals in the joints. Uric acid is a chemical that is normally present in the blood. When the level of uric acid in the blood is too high it can form crystals that deposit in your joints and tissues. This causes joint redness, soreness, and swelling (inflammation). Repeat attacks are common. Over time, uric acid crystals can form into masses (tophi) near a joint, destroying bone and causing disfigurement. Gout is treatable and often preventable. CAUSES  The disease begins with elevated levels of uric acid in the blood. Uric acid is produced by your body when it breaks down a naturally found substance called purines. Certain foods you eat, such as meats and fish, contain high amounts of purines. Causes of an elevated uric acid level include:  Being passed down from parent to child (heredity).  Diseases that cause increased uric acid production (such as obesity, psoriasis, and certain cancers).  Excessive alcohol use.  Diet, especially diets rich in meat and seafood.  Medicines, including certain cancer-fighting medicines (chemotherapy), water pills (diuretics), and aspirin.  Chronic kidney disease. The kidneys are no longer able to remove uric acid well.  Problems with metabolism. Conditions strongly associated with gout include:  Obesity.  High blood pressure.  High cholesterol.  Diabetes. Not everyone with elevated uric acid levels gets gout. It is not understood why some people get gout and others do not. Surgery, joint injury, and eating too much of certain foods are some of the  factors that can lead to gout attacks. SYMPTOMS   An attack of gout comes on quickly. It causes intense pain with redness, swelling, and warmth in a joint.  Fever can occur.  Often, only one joint is involved. Certain joints are more commonly involved:  Base of the big toe.  Knee.  Ankle.  Wrist.  Finger. Without treatment, an attack usually goes away in a few days to weeks. Between attacks, you usually will not have symptoms, which is different from many other forms of arthritis. DIAGNOSIS  Your caregiver will suspect gout based on your symptoms and exam. In some cases, tests may be recommended. The tests may include:  Blood tests.  Urine tests.  X-rays.  Joint fluid exam. This exam requires a needle to remove fluid from the joint (arthrocentesis). Using a microscope, gout is confirmed when uric acid crystals are seen in the joint fluid. TREATMENT  There are two phases to gout treatment: treating the sudden onset (acute) attack and preventing attacks (prophylaxis).  Treatment of an Acute Attack.  Medicines are used. These include anti-inflammatory medicines or steroid medicines.  An injection of steroid medicine into the affected joint is sometimes necessary.  The painful joint is rested. Movement can worsen the arthritis.  You may use warm or cold treatments on painful joints, depending which works best for you.  Treatment to Prevent Attacks.  If you suffer from frequent gout attacks, your caregiver may advise preventive medicine. These medicines are started after the acute attack subsides. These medicines either help your kidneys eliminate uric acid from your body or decrease your uric acid production. You  may need to stay on these medicines for a very long time.  The early phase of treatment with preventive medicine can be associated with an increase in acute gout attacks. For this reason, during the first few months of treatment, your caregiver may also advise you  to take medicines usually used for acute gout treatment. Be sure you understand your caregiver's directions. Your caregiver may make several adjustments to your medicine dose before these medicines are effective.  Discuss dietary treatment with your caregiver or dietitian. Alcohol and drinks high in sugar and fructose and foods such as meat, poultry, and seafood can increase uric acid levels. Your caregiver or dietitian can advise you on drinks and foods that should be limited. HOME CARE INSTRUCTIONS   Do not take aspirin to relieve pain. This raises uric acid levels.  Only take over-the-counter or prescription medicines for pain, discomfort, or fever as directed by your caregiver.  Rest the joint as much as possible. When in bed, keep sheets and blankets off painful areas.  Keep the affected joint raised (elevated).  Apply warm or cold treatments to painful joints. Use of warm or cold treatments depends on which works best for you.  Use crutches if the painful joint is in your leg.  Drink enough fluids to keep your urine clear or pale yellow. This helps your body get rid of uric acid. Limit alcohol, sugary drinks, and fructose drinks.  Follow your dietary instructions. Pay careful attention to the amount of protein you eat. Your daily diet should emphasize fruits, vegetables, whole grains, and fat-free or low-fat milk products. Discuss the use of coffee, vitamin C, and cherries with your caregiver or dietitian. These may be helpful in lowering uric acid levels.  Maintain a healthy body weight. SEEK MEDICAL CARE IF:   You develop diarrhea, vomiting, or any side effects from medicines.  You do not feel better in 24 hours, or you are getting worse. SEEK IMMEDIATE MEDICAL CARE IF:   Your joint becomes suddenly more tender, and you have chills or a fever. MAKE SURE YOU:   Understand these instructions.  Will watch your condition.  Will get help right away if you are not doing well or  get worse.   This information is not intended to replace advice given to you by your health care provider. Make sure you discuss any questions you have with your health care provider.   Document Released: 12/10/2000 Document Revised: 01/03/2015 Document Reviewed: 07/26/2012 Elsevier Interactive Patient Education [employer].

## 2015-12-01 ENCOUNTER — Telehealth: Payer: Self-pay

## 2015-12-01 NOTE — Telephone Encounter (Signed)
Will complete forms. In the meantime, please fax a note to pt's company below stating Patient is cleared to return to work without restriction.

## 2015-12-01 NOTE — Telephone Encounter (Signed)
Pt needs a letter,statement etc from Dr Everlene Farrier faxed to his company at [phone] stating he can return to work,  [employer] for pt is [phone]

## 2015-12-01 NOTE — Telephone Encounter (Signed)
Patient dropped off disability form on 12/01/2015. He was seen on 11/24/2015 for gout. Blank forms have been scanned and placed in Dr. Raul Del box for completion in 5-7 business days. Please return to [facility] when done.

## 2015-12-01 NOTE — Telephone Encounter (Signed)
Spoke with pt, advised note faxed. Pt understood.

## 2015-12-11 NOTE — Telephone Encounter (Signed)
Did we ever finish the paperwork for this patients disability?

## 2016-03-05 NOTE — Telephone Encounter (Signed)
done

## 2016-04-08 ENCOUNTER — Other Ambulatory Visit: Payer: Self-pay | Admitting: *Deleted

## 2016-04-08 MED ORDER — NEBIVOLOL HCL 10 MG PO TABS: 10.0000 mg | ORAL_TABLET | Freq: Every day | ORAL | Status: DC

## 2016-06-09 ENCOUNTER — Telehealth: Payer: Self-pay | Admitting: Cardiology

## 2016-06-09 MED ORDER — RIVAROXABAN 20 MG PO TABS: 20.0000 mg | ORAL_TABLET | Freq: Every day | ORAL | Status: DC

## 2016-06-09 MED ORDER — NEBIVOLOL HCL 10 MG PO TABS: 10.0000 mg | ORAL_TABLET | Freq: Every day | ORAL | Status: DC

## 2016-06-09 NOTE — Telephone Encounter (Signed)
NEw Message   Pt c/o BP issue: STAT if pt c/o blurred vision, one-sided weakness or slurred speech  1. What are your last 5 BP readings? 6/12 156/111 earlier am   144/110 10am  2. Are you having any other symptoms (ex. Dizziness, headache, blurred vision, passed out)? 300+ protein, blood, ketone, bili  in urine   3. What is your BP issue? bp to high and pt needs to be seen today 6/14 bi later DVT afib   Comment: Out of med; xarelto

## 2016-06-09 NOTE — Telephone Encounter (Signed)
Spoke to patient  Patient states he went to occupational health at work - they called  Patient states blood pressure elevated. 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  patient also states he has not taken medication today. Need to call in prescription for Xarelto and bystolic Will pick up bystolic and xarelto today Offered patient an appointment today - patient decline  Preferred tomorrow appointment schedule 2 pm at [address] office with [facility] Verbalized understanding

## 2016-06-10 ENCOUNTER — Ambulatory Visit (INDEPENDENT_AMBULATORY_CARE_PROVIDER_SITE_OTHER): Payer: BLUE CROSS/BLUE SHIELD | Admitting: Nurse Practitioner

## 2016-06-10 ENCOUNTER — Encounter: Payer: Self-pay | Admitting: Nurse Practitioner

## 2016-06-10 VITALS — BP 130/98 | HR 71 | Ht 72.5 in | Wt 235.6 lb

## 2016-06-10 DIAGNOSIS — Z91199 Patient's noncompliance with other medical treatment and regimen due to unspecified reason: Secondary | ICD-10-CM

## 2016-06-10 DIAGNOSIS — I482 Chronic atrial fibrillation: Secondary | ICD-10-CM | POA: Diagnosis not present

## 2016-06-10 DIAGNOSIS — Z9119 Patient's noncompliance with other medical treatment and regimen: Secondary | ICD-10-CM | POA: Diagnosis not present

## 2016-06-10 DIAGNOSIS — I4821 Permanent atrial fibrillation: Secondary | ICD-10-CM

## 2016-06-10 DIAGNOSIS — I119 Hypertensive heart disease without heart failure: Secondary | ICD-10-CM | POA: Diagnosis not present

## 2016-06-10 NOTE — Progress Notes (Signed)
Office Visit    Patient Name: Isaiah [name] Date of Encounter: 06/10/2016  Primary Care Provider:  Jenny Reichmann, MD Primary Cardiologist:  Roni Bread, MD   Chief Complaint    55 y/o ? with a h/o permanent afib complicated by Ao and LE embolus in 2014,  prior NICM, HTN, CKD III, OSA, and noncompliance, who presents today for f/u related to HTN noted on work physical.  Past Medical History    Past Medical History  Diagnosis Date  . Hypertensive heart disease   . Gout   . Pinched nerve in neck   . Extremity ischemia, critical bil lower ext. 01/24/2013    Status post bilateral common femoral, profunda femoral and superficial femoral arterial embolectomy  . Terminal aortic occlusion (HCC) 01/24/2013    Status post embolectomy  . Erectile dysfunction   . CKD (chronic kidney disease) stage 3, GFR 30-59 ml/min 01/24/2013    Baseline creatinine roughly 1.7-1.8.  . History of TIA (transient ischemic attack) 2005    Carotid Dopplers January 2014 negative for significant stenosis.  . H/O right and left cardiac catheterization November 2009    30-40% RCA disease, cardiac output Fick 6.3, thermodilution 5.3.  Normal artery pressures.  . History of Non-ischemic cardiomyopathy 2009 - 2014    EF previously as low as 35-40% in 2009, up to 40 and 45% by 2012.  Repeat echo January 2014: Echo EF 55-60% mild LVH, mild to moderate left atrial enlargement, mild right atrial enlargement.  . Atrial fibrillation, permanent ([place]) 01/24/2013    chronic,previuosly refuses coumadin,now on xarelto  . OSA on CPAP     use DME- Choice titration study was on 02/21/13   Past Surgical History  Procedure Laterality Date  . Knee surgery    . Embolectomy  01/23/2013    Procedure: EMBOLECTOMY;  Surgeon: Serafina Mitchell, MD;  Location: [facility] OR;  Service: Vascular;  Laterality: Bilateral;  Bilateral femoral Embolectomy, Bilateral Iliac Embolectomy.  . Cardiac catheterization  Nov 2009    right and left  heart cath  ,cardiac output 6.3 by FICK AND 5.34 by thermal  diluation.norm RV pressures and nonobstructive cor disease. no shunt.some intramyocardial bridgingof the LAD  . Carotid dopplers  Nov 2009    done for TIA which were normal  . Doppler echocardiography  02/2011; 12/2012    a) EF40-45%; LA mod to severe dilated ;; b) EF 55-60%, mild LVH, Mild-Mod LA dilation    Allergies  Allergies  Allergen Reactions  . Ace Inhibitors Swelling    angioedema  . Codeine Itching    History of Present Illness    55 year old male with a history of permanent atrial fibrillation, hypertension, stage III chronic kidney disease, nonobstructive CAD, TIA, and sleep apnea. He also has a history of distal aortic and lower extremity embolism in the setting of A. fib and refusal of anticoagulant therapy in 2014. He required embolectomy as outlined in the past medical history and has since been maintained on xarelto. That said, he does report noncompliance with xarelto, often taking it only a few times a week and says today that he hasn't taken it in 3 weeks. He sometimes misses doses of his bystolic as well. He has missed a few doses this week and was seen for a routine work physical yesterday and was noted to be hypertensive with blood pressures in the 150s over 100s. He did not take his medication for 2 days prior to that visit. He was advised  that he may not return to work until he has follow-up with cardiology and improved blood pressures. He called yesterday and has by systolic was refilled and he resumed it yesterday. He was added to my schedule today. He has not been having any chest pain, dyspnea, PND, orthopnea, dizziness, syncope, edema, or early satiety. His blood pressure today is 130/98. He says he has a blood pressure cuff at home though he hasn't used it recently.  Home Medications    Prior to Admission medications   Medication Sig Start Date End Date Taking? Authorizing Provider  Multiple Vitamin (MULTIVITAMIN  WITH MINERALS) TABS Take 1 tablet by mouth daily.   Yes Historical Provider, MD  nebivolol (BYSTOLIC) 10 MG tablet Take 1 tablet (10 mg total) by mouth daily. NEED OV. 06/09/16  Yes Leonie Man, MD  rivaroxaban (XARELTO) 20 MG TABS tablet Take 1 tablet (20 mg total) by mouth daily with supper. 06/09/16  Yes Leonie Man, MD    Review of Systems    As above, he has been feeling well.  He denies chest pain, palpitations, dyspnea, pnd, orthopnea, n, v, dizziness, syncope, edema, weight gain, or early satiety.  All other systems reviewed and are otherwise negative except as noted above.  Physical Exam    VS:  BP 130/98 mmHg  Pulse 71  Ht 6' 0.5" (1.842 m)  Wt 235 lb 9.6 oz (106.867 kg)  BMI 31.50 kg/m2 , BMI Body mass index is 31.5 kg/(m^2). GEN: Well nourished, well developed, in no acute distress. HEENT: normal. Neck: Supple, no JVD, carotid bruits, or masses. Cardiac: RRR, no murmurs, rubs, or gallops. No clubbing, cyanosis, edema.  Radials/DP/PT 2+ and equal bilaterally.  Respiratory:  Respirations regular and unlabored, clear to auscultation bilaterally. GI: Soft, nontender, nondistended, BS + x 4. MS: no deformity or atrophy. Skin: warm and dry, no rash. Neuro:  Strength and sensation are intact. Psych: Normal affect.  Accessory Clinical Findings    ECG - Atrial fibrillation, 71, left axis deviation, left anterior fascicular block, no acute ST or T changes.  Assessment & Plan    1.  Hypertensive heart disease: Patient has a history of noncompliance with his medications and says that he ran out of by [facility] earlier this week. In that setting, he did not take it for a few days in a row and was seen for routine physical yesterday at work, and noted to have pressures in the 150s over 100s. He called the office yesterday and has by systolic refilled. He has not been taking it for 2 days. His pressure today isn't too bad at 130/98. He does have a cuff at home. I advised that he  follow his blood pressure at home on a daily basis for at least the next week to assess where his blood pressure trends. If he continues to have diastolics in the 0000000 or systolics greater than [id number], I've asked him to contact us so that we may treat his blood pressure more aggressively, and could likely increase bystolic to 20 mg daily versus adding chlorthalidone. In discussing this with him, he says he is not interested in changing his medicine or adding any medicine at this point and would like to focus on lifestyle modification and being more compliant with his medications. I think that is a reasonable approach as long as he is tracking his blood pressure daily.  2. Permanent atrial fibrillation: Patient is well rate controlled. He reports noncompliance with xarelto, stating he hasn't taken  it in 3 weeks. He has been using black seed oil which she says has a listed indication of thinning his blood. I advised that we have no reasonable randomized trial data to indicate that black seed oil is in any way equivalent to a vitamin K antagonist or direct thrombin or factor Xa inhibitor. In the setting of prior history of TIA and cardioembolic event requiring embolectomy of his distal aorta and lower extremities, I strongly encouraged him to take his xarelto as prescribed and also reiterated the increased risk of embolic stroke with abrupt discontinuation of xarelto. Patient says he will plan to take his xarelto as prescribed.  3. Noncompliance: As above, he has been noncompliant with both his antihypertensive therapy and anticoagulant therapy. I reiterated the need for compliance with his medications.  4. History of nonischemic cardiopathy: Last echo in January 2014 showed normalization of LV function. He has not been having any heart failure symptoms or signs and is euvolemic on exam today. Blood pressure control is important.   5. Disposition: Follow-up with Dr. Ellyn Hack in 3 months or sooner if  necessary.  Murray Hodgkins, NP 06/10/2016, 3:23 PM

## 2016-06-10 NOTE — Patient Instructions (Addendum)
Medication Instructions:  Your physician recommends that you continue on your current medications as directed. Please refer to the Current Medication list given to you today.  Be sure to take you Xarelto and Bystolic as prescribed and directed on the bottle.  Labwork: None ordered  Testing/Procedures: None ordered  Follow-Up: Your physician recommends that you schedule a follow-up appointment in: 3 MONTHS WITH DR. HARDING    Any Other Special Instructions Will Be Listed Below (If Applicable).     If you need a refill on your cardiac medications before your next appointment, please call your pharmacy.

## 2016-07-14 ENCOUNTER — Ambulatory Visit (INDEPENDENT_AMBULATORY_CARE_PROVIDER_SITE_OTHER): Payer: BLUE CROSS/BLUE SHIELD | Admitting: Emergency Medicine

## 2016-07-14 ENCOUNTER — Ambulatory Visit: Payer: Self-pay

## 2016-07-14 VITALS — BP 152/108 | HR 98 | Temp 98.1°F | Resp 17 | Ht 72.5 in | Wt 237.0 lb

## 2016-07-14 DIAGNOSIS — I482 Chronic atrial fibrillation: Secondary | ICD-10-CM | POA: Diagnosis not present

## 2016-07-14 DIAGNOSIS — I10 Essential (primary) hypertension: Secondary | ICD-10-CM | POA: Diagnosis not present

## 2016-07-14 DIAGNOSIS — I4821 Permanent atrial fibrillation: Secondary | ICD-10-CM

## 2016-07-14 DIAGNOSIS — M109 Gout, unspecified: Secondary | ICD-10-CM

## 2016-07-14 DIAGNOSIS — M10072 Idiopathic gout, left ankle and foot: Secondary | ICD-10-CM

## 2016-07-14 DIAGNOSIS — N183 Chronic kidney disease, stage 3 unspecified: Secondary | ICD-10-CM

## 2016-07-14 DIAGNOSIS — M10071 Idiopathic gout, right ankle and foot: Secondary | ICD-10-CM

## 2016-07-14 LAB — POCT CBC
GRANULOCYTE PERCENT: 63.4 % (ref 37–80)
HCT, POC: 44.9 % (ref 43.5–53.7)
Hemoglobin: 15.2 g/dL (ref 14.1–18.1)
Lymph, poc: 1.8 (ref 0.6–3.4)
MCH: 32.9 pg — AB (ref 27–31.2)
MCHC: 33.8 g/dL (ref 31.8–35.4)
MCV: 97.2 fL — AB (ref 80–97)
MID (cbc): 0.6 (ref 0–0.9)
MPV: 8.2 fL (ref 0–99.8)
PLATELET COUNT, POC: 154 10*3/uL (ref 142–424)
POC GRANULOCYTE: 4.2 (ref 2–6.9)
POC LYMPH PERCENT: 27.2 %L (ref 10–50)
POC MID %: 9.4 % (ref 0–12)
RBC: 4.62 M/uL — AB (ref 4.69–6.13)
RDW, POC: 14.6 %
WBC: 6.6 10*3/uL (ref 4.6–10.2)

## 2016-07-14 LAB — BASIC METABOLIC PANEL WITH GFR
BUN: 21 mg/dL (ref 7–25)
CHLORIDE: 108 mmol/L (ref 98–110)
CO2: 22 mmol/L (ref 20–31)
Calcium: 10.1 mg/dL (ref 8.6–10.3)
Creat: 2.01 mg/dL — ABNORMAL HIGH (ref 0.70–1.33)
GFR, EST AFRICAN AMERICAN: 42 mL/min — AB (ref >=60)
GFR, Est Non African American: 37 mL/min — ABNORMAL LOW (ref >=60)
GLUCOSE: 108 mg/dL — AB (ref 65–99)
Potassium: 4.2 mmol/L (ref 3.5–5.3)
Sodium: 140 mmol/L (ref 135–146)

## 2016-07-14 LAB — URIC ACID: Uric Acid, Serum: 9.4 mg/dL — ABNORMAL HIGH (ref 4.0–8.0)

## 2016-07-14 MED ORDER — PREDNISONE 10 MG PO TABS: ORAL_TABLET | ORAL | Status: DC

## 2016-07-14 NOTE — Progress Notes (Signed)
By signing my name below, I, [name], attest that this documentation has been prepared under the direction and in the presence of Arlyss Queen, MD.  Electronically Signed: [name], Medical Scribe. 07/14/2016. 10:52 AM.  Chief Complaint:  Chief Complaint  Patient presents with  . Follow-up    gout   . Hypertension    HPI: Isaiah [name] is a 55 y.o. male with a PMHx of kidney disease who reports to [facility] today for follow-up. Pt reports his gout has spread to both of his big toes, and his left ankle. Pt reports the pain on his right big toe is a 7/10. Pt has had gout flares for 3 years. Pt states this is his first gout flare up this year. Pt has not seen the rheumatologist, but is willing to see one. Pt was on Allopurinol, Colchicine, and Prednisone in the past for his symptoms. Pt doesn't think he drinks enough water in the day. Pt takes black seed oil for relief to his symptoms. Pt wears steel toe boots and occasionally walks in water at work. Pt mentions he walks about an hour to 30 mins a day, then bikes for 30 mins when he doesn't have a gout flare up. Pt doesn't take any of these medications due to running out of his prescription. Pt no longer takes black cherry supplements. Pt denies taking pain medication- he doesn't like to take pain medication. Pt denies ever smoking and drinking.  Past Medical History  Diagnosis Date  . Hypertensive heart disease   . Gout   . Pinched nerve in neck   . Extremity ischemia, critical bil lower ext. 01/24/2013    Status post bilateral common femoral, profunda femoral and superficial femoral arterial embolectomy  . Terminal aortic occlusion (HCC) 01/24/2013    Status post embolectomy  . Erectile dysfunction   . CKD (chronic kidney disease) stage 3, GFR 30-59 ml/min 01/24/2013    Baseline creatinine roughly 1.7-1.8.  . History of TIA (transient ischemic attack) 2005    Carotid Dopplers January 2014 negative for significant stenosis.  . H/O  right and left cardiac catheterization November 2009    30-40% RCA disease, cardiac output Fick 6.3, thermodilution 5.3.  Normal artery pressures.  . History of Non-ischemic cardiomyopathy 2009 - 2014    EF previously as low as 35-40% in 2009, up to 40 and 45% by 2012.  Repeat echo January 2014: Echo EF 55-60% mild LVH, mild to moderate left atrial enlargement, mild right atrial enlargement.  . Atrial fibrillation, permanent ([place]) 01/24/2013    chronic,previuosly refuses coumadin,now on xarelto  . OSA on CPAP     use DME- Choice titration study was on 02/21/13   Past Surgical History  Procedure Laterality Date  . Knee surgery    . Embolectomy  01/23/2013    Procedure: EMBOLECTOMY;  Surgeon: Serafina Mitchell, MD;  Location: [facility] OR;  Service: Vascular;  Laterality: Bilateral;  Bilateral femoral Embolectomy, Bilateral Iliac Embolectomy.  . Cardiac catheterization  Nov 2009    right and left  heart cath ,cardiac output 6.3 by FICK AND 5.34 by thermal  diluation.norm RV pressures and nonobstructive cor disease. no shunt.some intramyocardial bridgingof the LAD  . Carotid dopplers  Nov 2009    done for TIA which were normal  . Doppler echocardiography  02/2011; 12/2012    a) EF40-45%; LA mod to severe dilated ;; b) EF 55-60%, mild LVH, Mild-Mod LA dilation   Social History   Social History  .  Marital Status: Divorced    Spouse Name: N/A  . Number of Children: N/A  . Years of Education: N/A   Social History Main Topics  . Smoking status: Never Smoker   . Smokeless tobacco: Never Used  . Alcohol Use: No  . Drug Use: No  . Sexual Activity: Yes     Comment: married   Other Topics Concern  . None   Social History Narrative   Family History  Problem Relation Age of Onset  . Hypertension Mother   . Hypertension Father    Allergies  Allergen Reactions  . Ace Inhibitors Swelling    angioedema  . Codeine Itching   Prior to Admission medications   Medication Sig Start Date End Date  Taking? Authorizing Provider  nebivolol (BYSTOLIC) 10 MG tablet Take 1 tablet (10 mg total) by mouth daily. NEED OV. 06/09/16  Yes Leonie Man, MD  rivaroxaban (XARELTO) 20 MG TABS tablet Take 1 tablet (20 mg total) by mouth daily with supper. 06/09/16  Yes Leonie Man, MD  Multiple Vitamin (MULTIVITAMIN WITH MINERALS) TABS Take 1 tablet by mouth daily. Reported on 07/14/2016    Historical Provider, MD     ROS: The patient denies fevers, chills, night sweats, unintentional weight loss, chest pain, palpitations, wheezing, dyspnea on exertion, nausea, vomiting, abdominal pain, dysuria, hematuria, melena, numbness, weakness, or tingling.  All other systems have been reviewed and were otherwise negative with the exception of those mentioned in the HPI and as above.    PHYSICAL EXAM: Filed Vitals:   07/14/16 1048  BP: 152/108  Pulse: 98  Temp: 98.1 F (36.7 C)  Resp: 17   Body mass index is 31.68 kg/(m^2).  Manual blood pressure check: [id number] systolic both arms, but I couldn't get a definite diastolic.  General: Alert, no acute distress HEENT:  Normocephalic, atraumatic, oropharynx patent. Eye: [name] [facility] Cardiovascular:  Irregular rate and rhythm, no rubs murmurs or gallops.  No Carotid bruits, radial pulse intact. No pedal edema.  Respiratory: Clear to auscultation bilaterally.  No wheezes, rales, or rhonchi.  No cyanosis, no use of accessory musculature Abdominal: No organomegaly, abdomen is soft and non-tender, positive bowel sounds.  No masses. Musculoskeletal: Gait intact. No tenderness. Swelling and inflammation at the base of the right great toe, left ankle, and left great toe. Skin: No rashes. Neurologic: Facial musculature symmetric. Psychiatric: Patient acts appropriately throughout our interaction. Lymphatic: No cervical or submandibular lymphadenopathy  LABS: Results for orders placed or performed in visit on 07/14/16  POCT CBC  Result Value Ref Range   WBC 6.6  4.6 - 10.2 K/uL   Lymph, poc 1.8 0.6 - 3.4   POC LYMPH PERCENT 27.2 10 - 50 %L   MID (cbc) 0.6 0 - 0.9   POC MID % 9.4 0 - 12 %M   POC Granulocyte 4.2 2 - 6.9   Granulocyte percent 63.4 37 - 80 %G   RBC 4.62 (A) 4.69 - 6.13 M/uL   Hemoglobin 15.2 14.1 - 18.1 g/dL   HCT, POC 44.9 43.5 - 53.7 %   MCV 97.2 (A) 80 - 97 fL   MCH, POC 32.9 (A) 27 - 31.2 pg   MCHC 33.8 31.8 - 35.4 g/dL   RDW, POC 14.6 %   Platelet Count, POC 154 142 - 424 K/uL   MPV 8.2 0 - 99.8 fL   EKG/XRAY:   Primary read interpreted by Dr. Everlene Farrier at [facility].   ASSESSMENT/PLAN: I am very concerned about  compliance. He does not appear to take his blood thinner or blood pressure medications regular. He has been referred to nephrology in the past but none did not keep the appointment. I placed referral again to rheumatology and nephrology. He will be on a prednisone taper. He cannot take nonsteroidals because of his renal disease. He was given a one-week out of work note.I personally performed the services described in this documentation, which was scribed in my presence. The recorded information has been reviewed and is accurate.   Gross sideeffects, risk and benefits, and alternatives of medications d/w patient. Patient is aware that all medications have potential sideeffects and we are unable to predict every sideeffect or drug-drug interaction that may occur.  Arlyss Queen MD 07/14/2016 10:52 AM    Compliance is a severe issue with this patient. His blood pressure is elevated but I am never sure whether he is really taking his medications or not. For some reason he stopped all Purinol which was started on previous visits.

## 2016-07-14 NOTE — Patient Instructions (Addendum)
Please drink good quantities of water. Please take your  medications as prescribed. I have made a referral for you to see a nephrologist and rheumatologist.    IF you received an x-ray today, you will receive an invoice from [facility]. Please contact [facility] at [phone] with questions or concerns regarding your invoice.   IF you received labwork today, you will receive an invoice from [employer]. Please contact [facility] at [phone] with questions or concerns regarding your invoice.   Our billing staff will not be able to assist you with questions regarding bills from these companies.  You will be contacted with the lab results as soon as they are available. The fastest way to get your results is to activate your My Chart account. Instructions are located on the last page of this paperwork. If you have not heard from [place] regarding the results in 2 weeks, please contact this office.     Gout Gout is an inflammatory arthritis caused by a buildup of uric acid crystals in the joints. Uric acid is a chemical that is normally present in the blood. When the level of uric acid in the blood is too high it can form crystals that deposit in your joints and tissues. This causes joint redness, soreness, and swelling (inflammation). Repeat attacks are common. Over time, uric acid crystals can form into masses (tophi) near a joint, destroying bone and causing disfigurement. Gout is treatable and often preventable. CAUSES  The disease begins with elevated levels of uric acid in the blood. Uric acid is produced by your body when it breaks down a naturally found substance called purines. Certain foods you eat, such as meats and fish, contain high amounts of purines. Causes of an elevated uric acid level include:  Being passed down from parent to child (heredity).  Diseases that cause increased uric acid production (such as obesity, psoriasis, and certain  cancers).  Excessive alcohol use.  Diet, especially diets rich in meat and seafood.  Medicines, including certain cancer-fighting medicines (chemotherapy), water pills (diuretics), and aspirin.  Chronic kidney disease. The kidneys are no longer able to remove uric acid well.  Problems with metabolism. Conditions strongly associated with gout include:  Obesity.  High blood pressure.  High cholesterol.  Diabetes. Not everyone with elevated uric acid levels gets gout. It is not understood why some people get gout and others do not. Surgery, joint injury, and eating too much of certain foods are some of the factors that can lead to gout attacks. SYMPTOMS   An attack of gout comes on quickly. It causes intense pain with redness, swelling, and warmth in a joint.  Fever can occur.  Often, only one joint is involved. Certain joints are more commonly involved:  Base of the big toe.  Knee.  Ankle.  Wrist.  Finger. Without treatment, an attack usually goes away in a few days to weeks. Between attacks, you usually will not have symptoms, which is different from many other forms of arthritis. DIAGNOSIS  Your caregiver will suspect gout based on your symptoms and exam. In some cases, tests may be recommended. The tests may include:  Blood tests.  Urine tests.  X-rays.  Joint fluid exam. This exam requires a needle to remove fluid from the joint (arthrocentesis). Using a microscope, gout is confirmed when uric acid crystals are seen in the joint fluid. TREATMENT  There are two phases to gout treatment: treating the sudden onset (acute) attack and preventing attacks (prophylaxis).  Treatment of an Acute Attack.  Medicines are used. These include anti-inflammatory medicines or steroid medicines.  An injection of steroid medicine into the affected joint is sometimes necessary.  The painful joint is rested. Movement can worsen the arthritis.  You may use warm or cold  treatments on painful joints, depending which works best for you.  Treatment to Prevent Attacks.  If you suffer from frequent gout attacks, your caregiver may advise preventive medicine. These medicines are started after the acute attack subsides. These medicines either help your kidneys eliminate uric acid from your body or decrease your uric acid production. You may need to stay on these medicines for a very long time.  The early phase of treatment with preventive medicine can be associated with an increase in acute gout attacks. For this reason, during the first few months of treatment, your caregiver may also advise you to take medicines usually used for acute gout treatment. Be sure you understand your caregiver's directions. Your caregiver may make several adjustments to your medicine dose before these medicines are effective.  Discuss dietary treatment with your caregiver or dietitian. Alcohol and drinks high in sugar and fructose and foods such as meat, poultry, and seafood can increase uric acid levels. Your caregiver or dietitian can advise you on drinks and foods that should be limited. HOME CARE INSTRUCTIONS   Do not take aspirin to relieve pain. This raises uric acid levels.  Only take over-the-counter or prescription medicines for pain, discomfort, or fever as directed by your caregiver.  Rest the joint as much as possible. When in bed, keep sheets and blankets off painful areas.  Keep the affected joint raised (elevated).  Apply warm or cold treatments to painful joints. Use of warm or cold treatments depends on which works best for you.  Use crutches if the painful joint is in your leg.  Drink enough fluids to keep your urine clear or pale yellow. This helps your body get rid of uric acid. Limit alcohol, sugary drinks, and fructose drinks.  Follow your dietary instructions. Pay careful attention to the amount of protein you eat. Your daily diet should emphasize fruits,  vegetables, whole grains, and fat-free or low-fat milk products. Discuss the use of coffee, vitamin C, and cherries with your caregiver or dietitian. These may be helpful in lowering uric acid levels.  Maintain a healthy body weight. SEEK MEDICAL CARE IF:   You develop diarrhea, vomiting, or any side effects from medicines.  You do not feel better in 24 hours, or you are getting worse. SEEK IMMEDIATE MEDICAL CARE IF:   Your joint becomes suddenly more tender, and you have chills or a fever. MAKE SURE YOU:   Understand these instructions.  Will watch your condition.  Will get help right away if you are not doing well or get worse.   This information is not intended to replace advice given to you by your health care provider. Make sure you discuss any questions you have with your health care provider.   Document Released: 12/10/2000 Document Revised: 01/03/2015 Document Reviewed: 07/26/2012 Elsevier Interactive Patient Education [employer].

## 2016-07-16 ENCOUNTER — Telehealth: Payer: Self-pay | Admitting: Emergency Medicine

## 2016-07-16 NOTE — Telephone Encounter (Signed)
-----   Message from Darlyne Russian, MD sent at 07/15/2016  7:27 AM EDT ----- Kidney function has continued to decline. It is very very important to keep his follow-up appointment with the nephrologist and with the rheumatologist. His uric acid is significantly elevated and needs to be treated.

## 2016-07-16 NOTE — Telephone Encounter (Signed)
Pt given lab results and is scheduled to see Rheumatologist next Wednesday.

## 2016-07-20 ENCOUNTER — Telehealth: Payer: Self-pay

## 2016-07-20 NOTE — Telephone Encounter (Signed)
Patient needs FMLA forms updated for this year and his last time missing work. I have completed what I could from the [place] notes and his old paperwork. I have highlighted the areas that need to be updated and I will place the forms in your box on 07/20/16. If you could please return to the FMLA/Disability box at the [employer] checkout desk within 5-7 business days. Thank you!

## 2016-07-21 DIAGNOSIS — M109 Gout, unspecified: Secondary | ICD-10-CM | POA: Diagnosis not present

## 2016-07-21 DIAGNOSIS — M25474 Effusion, right foot: Secondary | ICD-10-CM | POA: Diagnosis not present

## 2016-07-21 DIAGNOSIS — N183 Chronic kidney disease, stage 3 (moderate): Secondary | ICD-10-CM | POA: Diagnosis not present

## 2016-07-21 DIAGNOSIS — I10 Essential (primary) hypertension: Secondary | ICD-10-CM | POA: Diagnosis not present

## 2016-07-22 NOTE — Telephone Encounter (Signed)
There was another set of forms to be completed I will place them in your box again on 07/22/16 if you could just return them when you complete them thank you!

## 2016-07-23 DIAGNOSIS — Z0271 Encounter for disability determination: Secondary | ICD-10-CM

## 2016-07-27 NOTE — Telephone Encounter (Signed)
Paperwork scanned and faxed to company along with calling the patient to come pick up his copies on 07/27/16

## 2016-08-13 DIAGNOSIS — N183 Chronic kidney disease, stage 3 (moderate): Secondary | ICD-10-CM | POA: Diagnosis not present

## 2016-08-16 ENCOUNTER — Other Ambulatory Visit: Payer: Self-pay | Admitting: Nephrology

## 2016-08-16 DIAGNOSIS — N183 Chronic kidney disease, stage 3 unspecified: Secondary | ICD-10-CM

## 2016-08-16 DIAGNOSIS — I129 Hypertensive chronic kidney disease with stage 1 through stage 4 chronic kidney disease, or unspecified chronic kidney disease: Secondary | ICD-10-CM

## 2016-08-18 DIAGNOSIS — M79671 Pain in right foot: Secondary | ICD-10-CM | POA: Diagnosis not present

## 2016-08-18 DIAGNOSIS — Z79899 Other long term (current) drug therapy: Secondary | ICD-10-CM | POA: Diagnosis not present

## 2016-08-18 DIAGNOSIS — M109 Gout, unspecified: Secondary | ICD-10-CM | POA: Diagnosis not present

## 2016-08-23 ENCOUNTER — Other Ambulatory Visit: Payer: Self-pay

## 2016-09-10 ENCOUNTER — Ambulatory Visit: Payer: Self-pay | Admitting: Cardiology

## 2016-09-13 ENCOUNTER — Encounter: Payer: Self-pay | Admitting: *Deleted

## 2016-09-13 ENCOUNTER — Other Ambulatory Visit: Payer: Self-pay

## 2016-09-20 ENCOUNTER — Other Ambulatory Visit: Payer: Self-pay

## 2016-09-28 ENCOUNTER — Other Ambulatory Visit: Payer: Self-pay | Admitting: Cardiology

## 2016-09-28 NOTE — Telephone Encounter (Signed)
Rx request sent to pharmacy.  

## 2016-10-12 ENCOUNTER — Other Ambulatory Visit: Payer: Self-pay

## 2016-12-18 ENCOUNTER — Other Ambulatory Visit: Payer: Self-pay | Admitting: Rheumatology

## 2016-12-18 ENCOUNTER — Other Ambulatory Visit: Payer: Self-pay | Admitting: Family Medicine

## 2016-12-22 ENCOUNTER — Telehealth: Payer: Self-pay | Admitting: Rheumatology

## 2016-12-22 NOTE — Telephone Encounter (Signed)
Unable to refill no office visit in greater than one year Message sent to front desk

## 2016-12-22 NOTE — Telephone Encounter (Signed)
-----   Message from Candice Camp, RT sent at 12/22/2016 10:04 AM EST ----- Unable to refill meds no office visit in greater than one year Needs appointment

## 2016-12-22 NOTE — Telephone Encounter (Signed)
Both numbers on file for patient are disconnected (same numbers that are in [facility] as well). I also tried calling his emergency contact to try to get a working number for patient and there wasn't an answer nor a voicemail set up on that number.

## 2016-12-23 ENCOUNTER — Other Ambulatory Visit: Payer: Self-pay | Admitting: Family Medicine

## 2016-12-23 ENCOUNTER — Other Ambulatory Visit: Payer: Self-pay | Admitting: Rheumatology

## 2017-01-10 ENCOUNTER — Ambulatory Visit (INDEPENDENT_AMBULATORY_CARE_PROVIDER_SITE_OTHER): Payer: BLUE CROSS/BLUE SHIELD | Admitting: Emergency Medicine

## 2017-01-10 VITALS — BP 150/88 | HR 77 | Temp 98.1°F | Resp 16 | Ht 72.5 in | Wt 245.0 lb

## 2017-01-10 DIAGNOSIS — M25561 Pain in right knee: Secondary | ICD-10-CM

## 2017-01-10 DIAGNOSIS — N183 Chronic kidney disease, stage 3 unspecified: Secondary | ICD-10-CM

## 2017-01-10 DIAGNOSIS — I10 Essential (primary) hypertension: Secondary | ICD-10-CM | POA: Diagnosis not present

## 2017-01-10 DIAGNOSIS — M109 Gout, unspecified: Secondary | ICD-10-CM | POA: Diagnosis not present

## 2017-01-10 MED ORDER — PREDNISONE 20 MG PO TABS: 40.0000 mg | ORAL_TABLET | Freq: Every day | ORAL | 0 refills | Status: AC

## 2017-01-10 MED ORDER — TRIAMCINOLONE ACETONIDE 40 MG/ML IJ SUSP
40.0000 mg | Freq: Once | INTRAMUSCULAR | Status: AC
  Administered 2017-01-10: 40 mg via INTRAMUSCULAR

## 2017-01-10 MED ORDER — ALLOPURINOL 100 MG PO TABS: 50.0000 mg | ORAL_TABLET | Freq: Every day | ORAL | 6 refills | Status: DC

## 2017-01-10 MED ORDER — ALLOPURINOL 100 MG PO TABS: 100.0000 mg | ORAL_TABLET | Freq: Every day | ORAL | 6 refills | Status: DC

## 2017-01-10 NOTE — Progress Notes (Signed)
Isaiah [name] 56 y.o.   Chief Complaint  Patient presents with  . Gout    RIght knee - began yesterday     HISTORY OF PRESENT ILLNESS: This is a 56 y.o. male complaining of pain to right knee since yesterday; denies injury. Has h/o Gout.  Knee Pain   The incident occurred 2 days ago. There was no injury mechanism. The pain is present in the right knee. The quality of the pain is described as aching. The pain is at a severity of 5/10. The pain is moderate. The pain has been constant since onset. Associated symptoms include a loss of motion. Pertinent negatives include no loss of sensation, muscle weakness, numbness or tingling. The symptoms are aggravated by movement, palpation and weight bearing. He has tried elevation, non-weight bearing and rest for the symptoms. The treatment provided no relief.     Prior to Admission medications   Medication Sig Start Date End Date Taking? Authorizing Provider  Multiple Vitamin (MULTIVITAMIN WITH MINERALS) TABS Take 1 tablet by mouth daily. Reported on 07/14/2016   Yes Historical Provider, MD  nebivolol (BYSTOLIC) 10 MG tablet Take 1 tablet (10 mg total) by mouth daily. 09/28/16  Yes Minus Breeding, MD  rivaroxaban (XARELTO) 20 MG TABS tablet Take 1 tablet (20 mg total) by mouth daily with supper. 06/09/16  Yes Leonie Man, MD  allopurinol (ZYLOPRIM) 100 MG tablet Take 0.5 tablets (50 mg total) by mouth daily. 01/10/17   Keith, MD  predniSONE (DELTASONE) 20 MG tablet Take 2 tablets (40 mg total) by mouth daily with breakfast. 01/10/17 01/15/17  Horald Pollen, MD    Allergies  Allergen Reactions  . Ace Inhibitors Swelling    angioedema  . Codeine Itching    Patient Active Problem List   Diagnosis Date Noted  . Hypertensive heart disease   . Drug therapy 02/02/2015  . Dyslipidemia 02/02/2015  . Obesity (BMI 30-39.9) 09/10/2013  . Gout 03/09/2013  . Terminal aortic occlusion (HCC) 01/24/2013  . Atrial fibrillation,  permanent ([place]) 01/24/2013  . Essential hypertension 01/24/2013  . Erectile dysfunction 01/24/2013  . CAD (coronary artery disease), non obstructive by cardiac cath 2009 01/24/2013    Past Medical History:  Diagnosis Date  . Atrial fibrillation, permanent ([place]) 01/24/2013   chronic,previuosly refuses coumadin,now on xarelto  . CKD (chronic kidney disease) stage 3, GFR 30-59 ml/min 01/24/2013   Baseline creatinine roughly 1.7-1.8.  Marland Kitchen Erectile dysfunction   . Extremity ischemia, critical bil lower ext. 01/24/2013   Status post bilateral common femoral, profunda femoral and superficial femoral arterial embolectomy  . Gout   . H/O right and left cardiac catheterization November 2009   30-40% RCA disease, cardiac output Fick 6.3, thermodilution 5.3.  Normal artery pressures.  . History of Non-ischemic cardiomyopathy 2009 - 2014   EF previously as low as 35-40% in 2009, up to 40 and 45% by 2012.  Repeat echo January 2014: Echo EF 55-60% mild LVH, mild to moderate left atrial enlargement, mild right atrial enlargement.  . History of TIA (transient ischemic attack) 2005   Carotid Dopplers January 2014 negative for significant stenosis.  . Hypertensive heart disease   . OSA on CPAP    use DME- Choice titration study was on 02/21/13  . Pinched nerve in neck   . Terminal aortic occlusion ([place]) 01/24/2013   Status post embolectomy    Past Surgical History:  Procedure Laterality Date  . CARDIAC CATHETERIZATION  Nov 2009   right  and left  heart cath ,cardiac output 6.3 by FICK AND 5.34 by thermal  diluation.norm RV pressures and nonobstructive cor disease. no shunt.some intramyocardial bridgingof the LAD  . CAROTID DOPPLERS  Nov 2009   done for TIA which were normal  . DOPPLER ECHOCARDIOGRAPHY  02/2011; 12/2012   a) EF40-45%; LA mod to severe dilated ;; b) EF 55-60%, mild LVH, Mild-Mod LA dilation  . EMBOLECTOMY  01/23/2013   Procedure: EMBOLECTOMY;  Surgeon: Serafina Mitchell, MD;  Location: [facility] OR;   Service: Vascular;  Laterality: Bilateral;  Bilateral femoral Embolectomy, Bilateral Iliac Embolectomy.  Marland Kitchen KNEE SURGERY      Social History   Social History  . Marital status: Divorced    Spouse name: N/A  . Number of children: N/A  . Years of education: N/A   Occupational History  . Not on file.   Social History Main Topics  . Smoking status: Never Smoker  . Smokeless tobacco: Never Used  . Alcohol use No  . Drug use: No  . Sexual activity: Yes     Comment: married   Other Topics Concern  . Not on file   Social History Narrative  . No narrative on file    Family History  Problem Relation Age of Onset  . Hypertension Mother   . Hypertension Father      Review of Systems  Constitutional: Negative for chills and fever.  HENT: Negative.   Eyes: Negative.   Respiratory: Negative.   Cardiovascular: Negative.   Gastrointestinal: Negative for nausea and vomiting.  Genitourinary: Negative for dysuria.  Musculoskeletal: Positive for joint pain (right knee pain).  Skin: Negative for rash.  Neurological: Negative.  Negative for tingling and numbness.  Endo/Heme/Allergies: Negative.   Psychiatric/Behavioral: Negative.    Vitals:   01/10/17 1030 01/10/17 1041  BP: (!) 162/110 (!) 150/88  Pulse: 77   Resp: 16   Temp: 98.1 F (36.7 C)   Repeat BP 150/88  Physical Exam  Constitutional: He is oriented to person, place, and time. He appears well-developed and well-nourished.  HENT:  Head: Normocephalic and atraumatic.  Nose: Nose normal.  Mouth/Throat: Oropharynx is clear and moist.  Eyes: Conjunctivae and EOM are normal. Pupils are equal, round, and reactive to light.  Neck: Normal range of motion. Neck supple. No thyromegaly present.  Cardiovascular: Normal rate, regular rhythm, normal heart sounds and intact distal pulses.   Pulmonary/Chest: Effort normal and breath sounds normal.  Abdominal: Soft. Bowel sounds are normal. He exhibits no distension. There is no  tenderness.  Musculoskeletal:  Right knee: +anterior tenderness with mild swelling; FROM  Lymphadenopathy:    He has no cervical adenopathy.  Neurological: He is alert and oriented to person, place, and time. No sensory deficit. He exhibits normal muscle tone.  Skin: Skin is warm and dry. Capillary refill takes less than 2 seconds.  Psychiatric: He has a normal mood and affect. His behavior is normal.  Vitals reviewed.    ASSESSMENT & PLAN: Delsin was seen today for gout.  Diagnoses and all orders for this visit:  Acute gout of right knee, unspecified cause -     triamcinolone acetonide (KENALOG-40) injection 40 mg; Inject 1 mL (40 mg total) into the muscle once.  Acute pain of right knee  Essential hypertension  CKD (chronic kidney disease) stage 3, GFR 30-59 ml/min  Other orders -     predniSONE (DELTASONE) 20 MG tablet; Take 2 tablets (40 mg total) by mouth daily with breakfast. -  Discontinue: allopurinol (ZYLOPRIM) 100 MG tablet; Take 1 tablet (100 mg total) by mouth daily. -     allopurinol (ZYLOPRIM) 100 MG tablet; Take 0.5 tablets (50 mg total) by mouth daily.    Patient Instructions       IF you received an x-ray today, you will receive an invoice from [facility]. Please contact [facility] at [phone] with questions or concerns regarding your invoice.   IF you received labwork today, you will receive an invoice from [place]. Please contact [facility] at [phone] with questions or concerns regarding your invoice.   Our billing staff will not be able to assist you with questions regarding bills from these companies.  You will be contacted with the lab results as soon as they are available. The fastest way to get your results is to activate your My Chart account. Instructions are located on the last page of this paperwork. If you have not heard from [place] regarding the results in 2 weeks, please contact this office.       Gout Introduction Gout is painful swelling that can happen in some of your joints. Gout is a type of arthritis. This condition is caused by having too much uric acid in your body. Uric acid is a chemical that is made when your body breaks down substances called purines. If your body has too much uric acid, sharp crystals can form and build up in your joints. This causes pain and swelling. Gout attacks can happen quickly and be very painful (acute gout). Over time, the attacks can affect more joints and happen more often (chronic gout). Follow these instructions at home: During a Gout Attack  If directed, put ice on the painful area:  Put ice in a plastic bag.  Place a towel between your skin and the bag.  Leave the ice on for 20 minutes, 2-3 times a day.  Rest the joint as much as possible. If the joint is in your leg, you may be given crutches to use.  Raise (elevate) the painful joint above the level of your heart as often as you can.  Drink enough fluids to keep your pee (urine) clear or pale yellow.  Take over-the-counter and prescription medicines only as told by your doctor.  Do not drive or use heavy machinery while taking prescription pain medicine.  Follow instructions from your doctor about what you can or cannot eat and drink.  Return to your normal activities as told by your doctor. Ask your doctor what activities are safe for you. Avoiding Future Gout Attacks  Follow a low-purine diet as told by a specialist (dietitian) or your doctor. Avoid foods and drinks that have a lot of purines, such as:  Liver.  Kidney.  Anchovies.  Asparagus.  Herring.  Mushrooms  Mussels.  Beer.  Limit alcohol intake to no more than 1 drink a day for nonpregnant women and 2 drinks a day for men. One drink equals 12 oz of beer, 5 oz of wine, or 1 oz of hard liquor.  Stay at a healthy weight or lose weight if you are overweight. If you want to lose weight, talk with your  doctor. It is important that you do not lose weight too fast.  Start or continue an exercise plan as told by your doctor.  Drink enough fluids to keep your pee clear or pale yellow.  Take over-the-counter and prescription medicines only as told by your doctor.  Keep all follow-up visits as told by your  doctor. This is important. Contact a doctor if:  You have another gout attack.  You still have symptoms of a gout attack after10 days of treatment.  You have problems (side effects) because of your medicines.  You have chills or a fever.  You have burning pain when you pee (urinate).  You have pain in your lower back or belly. Get help right away if:  You have very bad pain.  Your pain cannot be controlled.  You cannot pee. This information is not intended to replace advice given to you by your health care provider. Make sure you discuss any questions you have with your health care provider. Document Released: 09/21/2008 Document Revised: 05/20/2016 Document Reviewed: 09/25/2015  2017 Elsevier      Agustina Caroli, MD [facility]

## 2017-01-10 NOTE — Patient Instructions (Addendum)
IF you received an x-ray today, you will receive an invoice from [facility]. Please contact [facility] at [phone] with questions or concerns regarding your invoice.   IF you received labwork today, you will receive an invoice from [facility]. Please contact [facility] at [phone] with questions or concerns regarding your invoice.   Our billing staff will not be able to assist you with questions regarding bills from these companies.  You will be contacted with the lab results as soon as they are available. The fastest way to get your results is to activate your My Chart account. Instructions are located on the last page of this paperwork. If you have not heard from [place] regarding the results in 2 weeks, please contact this office.      Gout Introduction Gout is painful swelling that can happen in some of your joints. Gout is a type of arthritis. This condition is caused by having too much uric acid in your body. Uric acid is a chemical that is made when your body breaks down substances called purines. If your body has too much uric acid, sharp crystals can form and build up in your joints. This causes pain and swelling. Gout attacks can happen quickly and be very painful (acute gout). Over time, the attacks can affect more joints and happen more often (chronic gout). Follow these instructions at home: During a Gout Attack  If directed, put ice on the painful area:  Put ice in a plastic bag.  Place a towel between your skin and the bag.  Leave the ice on for 20 minutes, 2-3 times a day.  Rest the joint as much as possible. If the joint is in your leg, you may be given crutches to use.  Raise (elevate) the painful joint above the level of your heart as often as you can.  Drink enough fluids to keep your pee (urine) clear or pale yellow.  Take over-the-counter and prescription medicines only as told by your doctor.  Do not drive or use heavy machinery  while taking prescription pain medicine.  Follow instructions from your doctor about what you can or cannot eat and drink.  Return to your normal activities as told by your doctor. Ask your doctor what activities are safe for you. Avoiding Future Gout Attacks  Follow a low-purine diet as told by a specialist (dietitian) or your doctor. Avoid foods and drinks that have a lot of purines, such as:  Liver.  Kidney.  Anchovies.  Asparagus.  Herring.  Mushrooms  Mussels.  Beer.  Limit alcohol intake to no more than 1 drink a day for nonpregnant women and 2 drinks a day for men. One drink equals 12 oz of beer, 5 oz of wine, or 1 oz of hard liquor.  Stay at a healthy weight or lose weight if you are overweight. If you want to lose weight, talk with your doctor. It is important that you do not lose weight too fast.  Start or continue an exercise plan as told by your doctor.  Drink enough fluids to keep your pee clear or pale yellow.  Take over-the-counter and prescription medicines only as told by your doctor.  Keep all follow-up visits as told by your doctor. This is important. Contact a doctor if:  You have another gout attack.  You still have symptoms of a gout attack after10 days of treatment.  You have problems (side effects) because of your medicines.  You have chills or a  fever.  You have burning pain when you pee (urinate).  You have pain in your lower back or belly. Get help right away if:  You have very bad pain.  Your pain cannot be controlled.  You cannot pee. This information is not intended to replace advice given to you by your health care provider. Make sure you discuss any questions you have with your health care provider. Document Released: 09/21/2008 Document Revised: 05/20/2016 Document Reviewed: 09/25/2015  2017 Elsevier

## 2017-01-15 ENCOUNTER — Ambulatory Visit (INDEPENDENT_AMBULATORY_CARE_PROVIDER_SITE_OTHER): Payer: BLUE CROSS/BLUE SHIELD | Admitting: Family Medicine

## 2017-01-15 VITALS — BP 122/98 | HR 75 | Temp 97.7°F | Resp 18 | Ht 73.0 in | Wt 239.1 lb

## 2017-01-15 DIAGNOSIS — M109 Gout, unspecified: Secondary | ICD-10-CM | POA: Diagnosis not present

## 2017-01-15 MED ORDER — COLCHICINE 0.6 MG PO TABS: ORAL_TABLET | ORAL | 1 refills | Status: DC

## 2017-01-15 NOTE — Patient Instructions (Signed)
It was good to meet you today.  Take the Colchicine 2 pills as soon as she picked it up. Then take 1 more pill an hour later. After that you can take once the morning and once in the evening until you're flare is finished.  Restart the allopurinol once you're done with the colchicine.  I have written a work note for you.  If you're having any issues by Wednesday or Thursday of next week come back and see [place].

## 2017-01-15 NOTE — Progress Notes (Signed)
Isaiah [name] is a 56 y.o. male who presents to [facility] today for FU for Right knee pain:  1.  FU for gout:  Patient is still having ongoing pain despite treatment with prednisone. He was seen here January 15 and treated with Depo-Medrol injection plus oral prednisone. He states in the past colchicine as helped him more. He continues to take his allopurinol despite this flare. He has redness in his right knee. Pain on bearing weight or with attempts to bend knee. 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 He has been working this week which has exacerbated his pain. No fevers or chills. No muscle or knee weakness.  Strong personal history of gout.    ROS as above.  Pertinently, no chest pain, palpitations, SOB, Fever, Chills, Abd pain, N/V/D.   PMH reviewed. Patient is a nonsmoker.   Past Medical History:  Diagnosis Date  . Atrial fibrillation, permanent ([place]) 01/24/2013   chronic,previuosly refuses coumadin,now on xarelto  . CKD (chronic kidney disease) stage 3, GFR 30-59 ml/min 01/24/2013   Baseline creatinine roughly 1.7-1.8.  Marland Kitchen Erectile dysfunction   . Extremity ischemia, critical bil lower ext. 01/24/2013   Status post bilateral common femoral, profunda femoral and superficial femoral arterial embolectomy  . Gout   . H/O right and left cardiac catheterization November 2009   30-40% RCA disease, cardiac output Fick 6.3, thermodilution 5.3.  Normal artery pressures.  . History of Non-ischemic cardiomyopathy 2009 - 2014   EF previously as low as 35-40% in 2009, up to 40 and 45% by 2012.  Repeat echo January 2014: Echo EF 55-60% mild LVH, mild to moderate left atrial enlargement, mild right atrial enlargement.  . History of TIA (transient ischemic attack) 2005   Carotid Dopplers January 2014 negative for significant stenosis.  . Hypertensive heart disease   . OSA on CPAP    use DME- Choice titration study was on 02/21/13  . Pinched nerve in neck     . Terminal aortic occlusion ([place]) 01/24/2013   Status post embolectomy   Past Surgical History:  Procedure Laterality Date  . CARDIAC CATHETERIZATION  Nov 2009   right and left  heart cath ,cardiac output 6.3 by FICK AND 5.34 by thermal  diluation.norm RV pressures and nonobstructive cor disease. no shunt.some intramyocardial bridgingof the LAD  . CAROTID DOPPLERS  Nov 2009   done for TIA which were normal  . DOPPLER ECHOCARDIOGRAPHY  02/2011; 12/2012   a) EF40-45%; LA mod to severe dilated ;; b) EF 55-60%, mild LVH, Mild-Mod LA dilation  . EMBOLECTOMY  01/23/2013   Procedure: EMBOLECTOMY;  Surgeon: Serafina Mitchell, MD;  [facility];  Service: Vascular;  Laterality: Bilateral;  Bilateral femoral Embolectomy, Bilateral Iliac Embolectomy.  Marland Kitchen KNEE SURGERY      Medications reviewed. Current Outpatient Prescriptions  Medication Sig Dispense Refill  . allopurinol (ZYLOPRIM) 100 MG tablet Take 0.5 tablets (50 mg total) by mouth daily. 30 tablet 6  . Multiple Vitamin (MULTIVITAMIN WITH MINERALS) TABS Take 1 tablet by mouth daily. Reported on 07/14/2016    . nebivolol (BYSTOLIC) 10 MG tablet Take 1 tablet (10 mg total) by mouth daily. 30 tablet 6  . predniSONE (DELTASONE) 20 MG tablet Take 2 tablets (40 mg total) by mouth daily with breakfast. 10 tablet 0  . rivaroxaban (XARELTO) 20 MG TABS tablet Take 1 tablet (20 mg total) by mouth daily with supper. 30 tablet 6  No current facility-administered medications for this visit.      Physical Exam:  BP (!) 140/100   Pulse 75   Temp 97.7 F (36.5 C) (Oral)   Resp 18   Ht 6\' 1"  (1.854 m)   Wt 239 lb 2 oz (108.5 kg)   SpO2 99%   BMI 31.55 kg/m  Gen:  Alert, cooperative patient who appears stated age in no acute distress.  Vital signs reviewed. HEENT: EOMI,  MMM MSK:  Left knee WNL.  Right knee with erythema along the distal patellar aspect. He has warmth here. Also swelling here. He does have a mild effusion noted on medial and lateral  palpation of his joint line. No effusions. Aspect of his knee. He is able to bend his knee to about 30 before he is limited by pain. He is walking with a single crutch and has difficulty bearing weight without this.   Assessment and Plan:  1.  Gout: -With specific area of redness at distal patella tendon. -This has improved since being seen last time but still persists. -He states in the past colchicine as helped. -We are going to treat today with colchicine.  See instructions. -Do not think this is a septic joint as he has had some improvement and the redness has improved as well.  He's had no fevers or chills. -Evidently he works on a salary and has to be off for at least 7 working days otherwise he is not compensated.  I have written him out for 7 working days.   - Also given instructions to return if no improvement within next several days.  Return immediately if any evidence of worsening.

## 2017-01-15 NOTE — Addendum Note (Signed)
Addended byMingo Amber, Shaquill Iseman H on: 01/15/2017 12:49 PM   Modules accepted: Orders

## 2017-01-15 NOTE — Progress Notes (Signed)
     IF you received an x-ray today, you will receive an invoice from [facility]. Please contact [facility] at [phone] with questions or concerns regarding your invoice.   IF you received labwork today, you will receive an invoice from [facility]. Please contact [facility] at [phone] with questions or concerns regarding your invoice.   Our billing staff will not be able to assist you with questions regarding bills from these companies.  You will be contacted with the lab results as soon as they are available. The fastest way to get your results is to activate your My Chart account. Instructions are located on the last page of this paperwork. If you have not heard from us regarding the results in 2 weeks, please contact this office.     

## 2017-01-29 ENCOUNTER — Telehealth: Payer: Self-pay

## 2017-01-29 NOTE — Telephone Encounter (Signed)
Have you seen this form?

## 2017-01-29 NOTE — Telephone Encounter (Signed)
Pt had dropped off a form for dr. Mingo Amber to sign and has not heard anything and it was not in the box up front wondering if someone could check on that and give him a call when its ready    Please advise [phone]

## 2017-01-31 NOTE — Telephone Encounter (Signed)
I have not seen this form.  It was not in my box.

## 2017-02-24 NOTE — Telephone Encounter (Signed)
I have completed this form and placed up front in the Nurses box for patient to be called to come pick this up.

## 2017-02-24 NOTE — Telephone Encounter (Signed)
The form was placed in the [facility] box, it has been on my desk I will place the form in Dr Kennyth Arnold box on 02/24/17. This is not an FMLA forms so I do not need it back.

## 2017-02-28 NOTE — Telephone Encounter (Signed)
Tried to call and number is not in service

## 2017-03-01 ENCOUNTER — Telehealth: Payer: Self-pay | Admitting: Family Medicine

## 2017-03-01 NOTE — Telephone Encounter (Signed)
Patient keeps calling to check on his disability forms, I have not seen these forms or gotten a copy of them. I have tried to call the patient both off the number on his chart and the number he left on voicemail and neither of them are working numbers.  If he calls back please let him know that according to the telephone message prior to this one his papers are complete and ready to be picked.

## 2017-05-28 ENCOUNTER — Other Ambulatory Visit: Payer: Self-pay | Admitting: Cardiology

## 2017-08-30 ENCOUNTER — Telehealth: Payer: Self-pay | Admitting: Emergency Medicine

## 2017-08-30 NOTE — Telephone Encounter (Signed)
Pt dropped off FMLA paperwork, put paperwork in FMLA box.  08/30/17 11:19 AM  J.Clack

## 2017-08-31 NOTE — Telephone Encounter (Signed)
Done. Thank you, [name].

## 2017-08-31 NOTE — Telephone Encounter (Signed)
Patient brought in FMLA forms to be updated for renewal by Dr Mitchel Honour for his gout in his knee's. I completed the forms based off what Dr Everlene Farrier had completed for him a year ago since he stated it would be a lifelong disease and he would also have flare ups. I will place the forms in your box on 08/31/17 please sign them and return them to the FMLA/Disability box at the [address] checkout desk within 5-7 business days. Thank you!

## 2017-09-05 NOTE — Telephone Encounter (Signed)
Paperwork scanned and faxed to employer on 09/05/17

## 2017-09-23 DIAGNOSIS — Z0271 Encounter for disability determination: Secondary | ICD-10-CM

## 2017-11-02 ENCOUNTER — Other Ambulatory Visit: Payer: Self-pay | Admitting: Cardiology

## 2017-11-02 NOTE — Telephone Encounter (Signed)
Rx has been sent to the pharmacy electronically. ° °

## 2017-11-30 ENCOUNTER — Other Ambulatory Visit: Payer: Self-pay | Admitting: Cardiology

## 2017-11-30 ENCOUNTER — Ambulatory Visit: Payer: BLUE CROSS/BLUE SHIELD | Admitting: Physician Assistant

## 2017-11-30 ENCOUNTER — Encounter: Payer: Self-pay | Admitting: Physician Assistant

## 2017-11-30 VITALS — BP 136/84 | HR 66 | Temp 98.1°F | Resp 16 | Ht 73.0 in | Wt 241.8 lb

## 2017-11-30 DIAGNOSIS — M10471 Other secondary gout, right ankle and foot: Secondary | ICD-10-CM | POA: Diagnosis not present

## 2017-11-30 MED ORDER — PREDNISONE 20 MG PO TABS: 30.0000 mg | ORAL_TABLET | Freq: Every day | ORAL | 0 refills | Status: AC

## 2017-11-30 MED ORDER — COLCHICINE 0.6 MG PO TABS: ORAL_TABLET | ORAL | 1 refills | Status: DC

## 2017-11-30 NOTE — Telephone Encounter (Signed)
Rx(s) sent to pharmacy electronically.  

## 2017-11-30 NOTE — Patient Instructions (Addendum)
Please continue the allopurinol.  Stopping it will not help, and restarting it after the flare, may cause a gouty flare--so continue taking.   Gout Gout is painful swelling that can happen in some of your joints. Gout is a type of arthritis. This condition is caused by having too much uric acid in your body. Uric acid is a chemical that is made when your body breaks down substances called purines. If your body has too much uric acid, sharp crystals can form and build up in your joints. This causes pain and swelling. Gout attacks can happen quickly and be very painful (acute gout). Over time, the attacks can affect more joints and happen more often (chronic gout). Follow these instructions at home: During a Gout Attack  If directed, put ice on the painful area: ? Put ice in a plastic bag. ? Place a towel between your skin and the bag. ? Leave the ice on for 20 minutes, 2-3 times a day.  Rest the joint as much as possible. If the joint is in your leg, you may be given crutches to use.  Raise (elevate) the painful joint above the level of your heart as often as you can.  Drink enough fluids to keep your pee (urine) clear or pale yellow.  Take over-the-counter and prescription medicines only as told by your doctor.  Do not drive or use heavy machinery while taking prescription pain medicine.  Follow instructions from your doctor about what you can or cannot eat and drink.  Return to your normal activities as told by your doctor. Ask your doctor what activities are safe for you. Avoiding Future Gout Attacks  Follow a low-purine diet as told by a specialist (dietitian) or your doctor. Avoid foods and drinks that have a lot of purines, such as: ? Liver. ? Kidney. ? Anchovies. ? Asparagus. ? Herring. ? Mushrooms ? Mussels. ? Beer.  Limit alcohol intake to no more than 1 drink a day for nonpregnant women and 2 drinks a day for men. One drink equals 12 oz of beer, 5 oz of wine, or 1 oz  of hard liquor.  Stay at a healthy weight or lose weight if you are overweight. If you want to lose weight, talk with your doctor. It is important that you do not lose weight too fast.  Start or continue an exercise plan as told by your doctor.  Drink enough fluids to keep your pee clear or pale yellow.  Take over-the-counter and prescription medicines only as told by your doctor.  Keep all follow-up visits as told by your doctor. This is important. Contact a doctor if:  You have another gout attack.  You still have symptoms of a gout attack after10 days of treatment.  You have problems (side effects) because of your medicines.  You have chills or a fever.  You have burning pain when you pee (urinate).  You have pain in your lower back or belly. Get help right away if:  You have very bad pain.  Your pain cannot be controlled.  You cannot pee. This information is not intended to replace advice given to you by your health care provider. Make sure you discuss any questions you have with your health care provider. Document Released: 09/21/2008 Document Revised: 05/20/2016 Document Reviewed: 09/25/2015 Elsevier Interactive Patient Education  2018 [employer].     IF you received an x-ray today, you will receive an invoice from [facility]. Please contact [facility] at [phone] with  questions or concerns regarding your invoice.   IF you received labwork today, you will receive an invoice from [facility]. Please contact [facility] at [phone] with questions or concerns regarding your invoice.   Our billing staff will not be able to assist you with questions regarding bills from these companies.  You will be contacted with the lab results as soon as they are available. The fastest way to get your results is to activate your My Chart account. Instructions are located on the last page of this paperwork. If you have not heard from [place] regarding the results in  2 weeks, please contact this office.

## 2017-11-30 NOTE — Progress Notes (Signed)
PRIMARY CARE AT [facility] [address], [place] [address] [phone]  Date:  11/30/2017   Name:  LENIN KUHNLE   DOB:  [birth year]   MRN:  [id number]  PCP:  Darlyne Russian, MD    History of Present Illness:  TASHAWN LASWELL is a 56 y.o. male patient who presents to PCP with  Chief Complaint  Patient presents with  . Gout    gout flare up in R foot, began yesterday morning     Yesterday, developed right foot pain at his 1st great toe right sided.  It is a little better today, but he could not bear much weight on it.  Swollen and red.  No fever.  He denies recent trauma or any over use or strenuous work over the last week.  He does have a hx of gout.  This has been at this toe before as well. He has had recent fish, and tomatoes that he can recall.  No etOH use.  Has consumed some redmeats as well.  Recently restarted the allopurinol over the last couple weeks.  He did not realize to take this daily.   Patient Active Problem List   Diagnosis Date Noted  . Hypertensive heart disease   . Drug therapy 02/02/2015  . Dyslipidemia 02/02/2015  . Obesity (BMI 30-39.9) 09/10/2013  . Gout 03/09/2013  . Terminal aortic occlusion ([facility]) 01/24/2013  . Atrial fibrillation, permanent ([place]) 01/24/2013  . Essential hypertension 01/24/2013  . Erectile dysfunction 01/24/2013  . CAD (coronary artery disease), non obstructive by cardiac cath 2009 01/24/2013    Past Medical History:  Diagnosis Date  . Atrial fibrillation, permanent ([place]) 01/24/2013   chronic,previuosly refuses coumadin,now on xarelto  . CKD (chronic kidney disease) stage 3, GFR 30-59 ml/min ([facility]) 01/24/2013   Baseline creatinine roughly 1.7-1.8.  Marland Kitchen Erectile dysfunction   . Extremity ischemia, critical bil lower ext. 01/24/2013   Status post bilateral common femoral, profunda femoral and superficial femoral arterial embolectomy  . Gout   . H/O right and left cardiac catheterization November 2009   30-40% RCA disease, cardiac  output Fick 6.3, thermodilution 5.3.  Normal artery pressures.  . History of Non-ischemic cardiomyopathy 2009 - 2014   EF previously as low as 35-40% in 2009, up to 40 and 45% by 2012.  Repeat echo January 2014: Echo EF 55-60% mild LVH, mild to moderate left atrial enlargement, mild right atrial enlargement.  . History of TIA (transient ischemic attack) 2005   Carotid Dopplers January 2014 negative for significant stenosis.  . Hypertensive heart disease   . OSA on CPAP    use DME- Choice titration study was on 02/21/13  . Pinched nerve in neck   . Terminal aortic occlusion ([place]) 01/24/2013   Status post embolectomy    Past Surgical History:  Procedure Laterality Date  . CARDIAC CATHETERIZATION  Nov 2009   right and left  heart cath ,cardiac output 6.3 by FICK AND 5.34 by thermal  diluation.norm RV pressures and nonobstructive cor disease. no shunt.some intramyocardial bridgingof the LAD  . CAROTID DOPPLERS  Nov 2009   done for TIA which were normal  . DOPPLER ECHOCARDIOGRAPHY  02/2011; 12/2012   a) EF40-45%; LA mod to severe dilated ;; b) EF 55-60%, mild LVH, Mild-Mod LA dilation  . EMBOLECTOMY  01/23/2013   Procedure: EMBOLECTOMY;  Surgeon: Serafina Mitchell, MD;  Location[facility];  Service: Vascular;  Laterality: Bilateral;  Bilateral femoral Embolectomy, Bilateral Iliac Embolectomy.  Marland Kitchen KNEE  SURGERY      Social History   Tobacco Use  . Smoking status: Never Smoker  . Smokeless tobacco: Never Used  Substance Use Topics  . Alcohol use: No    Alcohol/week: 0.0 oz  . Drug use: No    Family History  Problem Relation Age of Onset  . Hypertension Mother   . Hypertension Father     Allergies  Allergen Reactions  . Ace Inhibitors Swelling    angioedema  . Codeine Itching    Medication list has been reviewed and updated.  Current Outpatient Medications on File Prior to Visit  Medication Sig Dispense Refill  . allopurinol (ZYLOPRIM) 100 MG tablet Take 0.5 tablets (50 mg total)  by mouth daily. 30 tablet 6  . nebivolol (BYSTOLIC) 10 MG tablet Take 1 tablet (10 mg total) daily by mouth. Need appointment before anymore refills 30 tablet 0  . XARELTO 20 MG TABS tablet TAKE 1 TABLET BY MOUTH DAILY WITH SUPPER. NEEDS TO MAKE APPOINTMENT 30 tablet 0  . Multiple Vitamin (MULTIVITAMIN WITH MINERALS) TABS Take 1 tablet by mouth daily. Reported on 07/14/2016     No current facility-administered medications on file prior to visit.     ROS ROS otherwise unremarkable unless listed above.  Physical Examination: BP 136/84 (BP Location: Left Arm, Patient Position: Sitting, Cuff Size: Large)   Pulse 66   Temp 98.1 F (36.7 C) (Oral)   Resp 16   Ht 6\' 1"  (1.854 m)   Wt 241 lb 12.8 oz (109.7 kg)   SpO2 99%   BMI 31.90 kg/m  Ideal Body Weight: Weight in (lb) to have BMI = 25: 189.1  Physical Exam  Constitutional: He is oriented to person, place, and time. He appears well-developed and well-nourished. No distress.  HENT:  Head: Normocephalic and atraumatic.  Eyes: Conjunctivae and EOM are normal. Pupils are equal, round, and reactive to light.  Cardiovascular: Normal rate.  Pulmonary/Chest: Effort normal. No respiratory distress.  Musculoskeletal:       Right foot: There is decreased range of motion (passive rom worsened pain wtih extension but incited in all planes) and swelling (mild erythema at the medial area of the 1st metatarsal joint.  tenderness along this area). There is normal capillary refill, no crepitus and no laceration.  Neurological: He is alert and oriented to person, place, and time.  Skin: Skin is warm and dry. He is not diaphoretic.  Psychiatric: He has a normal mood and affect. His behavior is normal.     Assessment and Plan: JORAN KALLAL is a 57 y.o. male who is here today for cc of  Chief Complaint  Patient presents with  . Gout    gout flare up in R foot, began yesterday morning  --refilled colchicine and small prednisone regime --advised  to continue the allopurinol  --rtc as needed. Acute gout due to other secondary cause involving toe of right foot - Plan: colchicine 0.6 MG tablet, predniSONE (DELTASONE) 20 MG tablet  Ivar Drape, PA-C [facility] 12/10/20189:23 AM

## 2017-12-02 ENCOUNTER — Other Ambulatory Visit: Payer: Self-pay | Admitting: Cardiology

## 2017-12-02 ENCOUNTER — Telehealth: Payer: Self-pay

## 2017-12-02 NOTE — Telephone Encounter (Signed)
Copied from [place] [phone]. Topic: Inquiry >> Dec 01, 2017  3:45 PM Boyd Kerbs wrote: Reason for CRM:  He is needing a doctor note that includes Wednesday and Thursday. Patient can pick up tomorrow.  Works 3rd shift Please call when ready

## 2017-12-02 NOTE — Telephone Encounter (Signed)
Work note pended for patient, forwarded to provider for approval.

## 2017-12-07 NOTE — Telephone Encounter (Signed)
He can have a note.

## 2017-12-08 NOTE — Telephone Encounter (Signed)
Pt notifed and note is ready for pickup at the [address] building.

## 2017-12-18 ENCOUNTER — Other Ambulatory Visit: Payer: Self-pay | Admitting: Cardiology

## 2017-12-19 NOTE — Telephone Encounter (Signed)
REFILL 

## 2017-12-22 ENCOUNTER — Other Ambulatory Visit: Payer: Self-pay | Admitting: *Deleted

## 2017-12-22 ENCOUNTER — Telehealth: Payer: Self-pay | Admitting: Emergency Medicine

## 2017-12-22 MED ORDER — RIVAROXABAN 20 MG PO TABS: ORAL_TABLET | ORAL | 0 refills | Status: DC

## 2017-12-22 MED ORDER — NEBIVOLOL HCL 10 MG PO TABS
10.0000 mg | ORAL_TABLET | Freq: Every day | ORAL | 0 refills | Status: DC
Start: 2017-12-22 — End: 2018-01-24

## 2017-12-22 NOTE — Telephone Encounter (Signed)
Patient scheduled for annual exam- 01/13/18. Patient is no longer under care of cardiology and will be getting his maintenance medications from PCP. Bystolic and Xarelto  are needed until his annual exam. rx sent to pharmacy.

## 2017-12-22 NOTE — Telephone Encounter (Signed)
Copied from [place] [id number]. Topic: Quick Communication - Rx Refill/Question >> Dec 22, 2017 12:39 PM Yvette Rack wrote: Has the patient contacted their pharmacy? No.   (Agent: If no, request that the patient contact the pharmacy for the refill.)nebivolol (BYSTOLIC) 10 MG tablet  XARELTO 20 MG TABS tablet patient states that they sent the med refill to cardiologist he has onlt seen him once and they only gave him 3 pills on Bystolic and 10 pills of Xarelto he now sees only providers at [facility] Dr Everlene Farrier was his provider   Preferred Pharmacy (with phone number or street name): [facility], [place] - [place] [phone] (Phone) [phone] (Fax)     Agent: Please be advised that RX refills may take up to 3 business days. We ask that you follow-up with your pharmacy.

## 2018-01-13 ENCOUNTER — Other Ambulatory Visit: Payer: Self-pay

## 2018-01-13 ENCOUNTER — Ambulatory Visit (INDEPENDENT_AMBULATORY_CARE_PROVIDER_SITE_OTHER): Payer: BLUE CROSS/BLUE SHIELD | Admitting: Emergency Medicine

## 2018-01-13 ENCOUNTER — Encounter: Payer: Self-pay | Admitting: Emergency Medicine

## 2018-01-13 VITALS — BP 130/100 | HR 79 | Temp 99.0°F | Resp 16 | Ht 72.5 in | Wt 238.0 lb

## 2018-01-13 DIAGNOSIS — N183 Chronic kidney disease, stage 3 unspecified: Secondary | ICD-10-CM

## 2018-01-13 DIAGNOSIS — Z0001 Encounter for general adult medical examination with abnormal findings: Secondary | ICD-10-CM | POA: Diagnosis not present

## 2018-01-13 DIAGNOSIS — I482 Chronic atrial fibrillation, unspecified: Secondary | ICD-10-CM

## 2018-01-13 DIAGNOSIS — Z8739 Personal history of other diseases of the musculoskeletal system and connective tissue: Secondary | ICD-10-CM | POA: Diagnosis not present

## 2018-01-13 DIAGNOSIS — I10 Essential (primary) hypertension: Secondary | ICD-10-CM

## 2018-01-13 NOTE — Progress Notes (Signed)
Isaiah [name] 57 y.o.   Chief Complaint  Patient presents with  . Annual Exam    HISTORY OF PRESENT ILLNESS: This is a 57 y.o. male Here for annual exam; no complaints and no medical concerns. However has a lot of medical problems. List reviewed with patient. Works night shifts; not very compliant with doctor visits. Has been on Bystolic and Xarelto x 2-3 years now; has chronic Afib. Physically active.  HPI   Prior to Admission medications   Medication Sig Start Date End Date Taking? Authorizing Provider  allopurinol (ZYLOPRIM) 100 MG tablet Take 0.5 tablets (50 mg total) by mouth daily. 01/10/17  Yes Penney Domanski, Ines Bloomer, MD  colchicine 0.6 MG tablet Take 2 tabs once and then 1 tab in an hour.  After that, take 1 tab po bid 11/30/17  Yes English, Stephanie D, PA  nebivolol (BYSTOLIC) 10 MG tablet Take 1 tablet (10 mg total) by mouth daily. NEED OV. 12/22/17  Yes Ruth Kovich, Ines Bloomer, MD  rivaroxaban Alveda Reasons) 20 MG TABS tablet take 1 tablet by mouth once daily WITH SUPPER 12/22/17  Yes Idaliz Tinkle, Ines Bloomer, MD  Multiple Vitamin (MULTIVITAMIN WITH MINERALS) TABS Take 1 tablet by mouth daily. Reported on 07/14/2016    [provider]    Allergies  Allergen Reactions  . Ace Inhibitors Swelling    angioedema  . Codeine Itching    Patient Active Problem List   Diagnosis Date Noted  . Hypertensive heart disease   . Drug therapy 02/02/2015  . Dyslipidemia 02/02/2015  . Obesity (BMI 30-39.9) 09/10/2013  . Gout 03/09/2013  . Terminal aortic occlusion (HCC) 01/24/2013  . Atrial fibrillation, permanent ([place]) 01/24/2013  . Essential hypertension 01/24/2013  . Erectile dysfunction 01/24/2013  . CAD (coronary artery disease), non obstructive by cardiac cath 2009 01/24/2013    Past Medical History:  Diagnosis Date  . Atrial fibrillation, permanent ([place]) 01/24/2013   chronic,previuosly refuses coumadin,now on xarelto  . CKD (chronic kidney disease) stage 3, GFR 30-59  ml/min (HCC) 01/24/2013   Baseline creatinine roughly 1.7-1.8.  Marland Kitchen Erectile dysfunction   . Extremity ischemia, critical bil lower ext. 01/24/2013   Status post bilateral common femoral, profunda femoral and superficial femoral arterial embolectomy  . Gout   . H/O right and left cardiac catheterization November 2009   30-40% RCA disease, cardiac output Fick 6.3, thermodilution 5.3.  Normal artery pressures.  . History of Non-ischemic cardiomyopathy 2009 - 2014   EF previously as low as 35-40% in 2009, up to 40 and 45% by 2012.  Repeat echo January 2014: Echo EF 55-60% mild LVH, mild to moderate left atrial enlargement, mild right atrial enlargement.  . History of TIA (transient ischemic attack) 2005   Carotid Dopplers January 2014 negative for significant stenosis.  . Hypertensive heart disease   . OSA on CPAP    use DME- Choice titration study was on 02/21/13  . Pinched nerve in neck   . Terminal aortic occlusion ([place]) 01/24/2013   Status post embolectomy    Past Surgical History:  Procedure Laterality Date  . CARDIAC CATHETERIZATION  Nov 2009   right and left  heart cath ,cardiac output 6.3 by FICK AND 5.34 by thermal  diluation.norm RV pressures and nonobstructive cor disease. no shunt.some intramyocardial bridgingof the LAD  . CAROTID DOPPLERS  Nov 2009   done for TIA which were normal  . DOPPLER ECHOCARDIOGRAPHY  02/2011; 12/2012   a) EF40-45%; LA mod to severe dilated ;; b) EF 55-60%, mild  LVH, Mild-Mod LA dilation  . EMBOLECTOMY  01/23/2013   Procedure: EMBOLECTOMY;  Surgeon: Serafina Mitchell, MD;  [facility];  Service: Vascular;  Laterality: Bilateral;  Bilateral femoral Embolectomy, Bilateral Iliac Embolectomy.  Marland Kitchen KNEE SURGERY      Social History   Socioeconomic History  . Marital status: Divorced    Spouse name: Not on file  . Number of children: Not on file  . Years of education: Not on file  . Highest education level: Not on file  Social Needs  . Financial resource  strain: Not on file  . Food insecurity - worry: Not on file  . Food insecurity - inability: Not on file  . Transportation needs - medical: Not on file  . Transportation needs - non-medical: Not on file  Occupational History  . Not on file  Tobacco Use  . Smoking status: Never Smoker  . Smokeless tobacco: Never Used  Substance and Sexual Activity  . Alcohol use: No    Alcohol/week: 0.0 oz  . Drug use: No  . Sexual activity: Yes    Comment: married  Other Topics Concern  . Not on file  Social History Narrative  . Not on file    Family History  Problem Relation Age of Onset  . Hypertension Mother   . Hypertension Father      Review of Systems  Constitutional: Negative.  Negative for chills, fever and weight loss.  HENT: Negative for congestion, hearing loss, nosebleeds and sore throat.   Eyes: Negative for blurred vision and double vision.       Uses reading glasses.  Respiratory: Negative.  Negative for cough and shortness of breath.   Cardiovascular: Positive for palpitations. Negative for chest pain.  Gastrointestinal: Negative.  Negative for abdominal pain, diarrhea, nausea and vomiting.  Genitourinary: Negative for dysuria and hematuria.  Musculoskeletal: Negative.  Negative for back pain, joint pain, myalgias and neck pain.  Skin: Negative for rash.  Neurological: Negative.  Negative for dizziness and headaches.  Endo/Heme/Allergies: Negative.   All other systems reviewed and are negative.  Vitals:   01/13/18 1000  BP: (!) 130/100  Pulse: 79  Resp: 16  Temp: 99 F (37.2 C)  SpO2: 98%     Physical Exam  Constitutional: He appears well-developed and well-nourished.  HENT:  Head: Normocephalic and atraumatic.  Right Ear: External ear normal.  Left Ear: External ear normal.  Nose: Nose normal.  Mouth/Throat: Oropharynx is clear and moist.  Eyes: Conjunctivae and EOM are normal. Pupils are equal, round, and reactive to light.  Neck: Normal range of  motion. Neck supple. No JVD present.  Cardiovascular: An irregularly irregular rhythm present.  No murmur heard. Pulmonary/Chest: Effort normal and breath sounds normal.  Abdominal: Soft. Bowel sounds are normal. He exhibits no distension. There is no tenderness.  Musculoskeletal: Normal range of motion.  Lymphadenopathy:    He has no cervical adenopathy.  Neurological: He is alert. No sensory deficit. He exhibits normal muscle tone.  Skin: Skin is warm and dry. Capillary refill takes less than 2 seconds. No rash noted.  Psychiatric: He has a normal mood and affect. His behavior is normal.  Vitals reviewed.  EKG: Atrial fibrillation with VR 78 (chronic). No acute ischemic changes.  ASSESSMENT & PLAN: Isaiah [name] was seen today for annual exam.  Diagnoses and all orders for this visit:  Encounter for general adult medical examination with abnormal findings -     EKG 12-Lead -  CBC with Differential -     Comprehensive metabolic panel -     Hemoglobin A1c -     Lipid panel -     PSA(Must document that pt has been informed of limitations of PSA testing.) -     Hepatitis C antibody screen -     HIV antibody  CKD (chronic kidney disease) stage 3, GFR 30-59 ml/min (HCC)  History of gout  Chronic atrial fibrillation (HCC)  Essential hypertension    Patient Instructions       IF you received an x-ray today, you will receive an invoice from [facility]. Please contact [facility] at [phone] with questions or concerns regarding your invoice.   IF you received labwork today, you will receive an invoice from [place]. Please contact [facility] at [phone] with questions or concerns regarding your invoice.   Our billing staff will not be able to assist you with questions regarding bills from these companies.  You will be contacted with the lab results as soon as they are available. The fastest way to get your results is to activate your My Chart account.  Instructions are located on the last page of this paperwork. If you have not heard from [place] regarding the results in 2 weeks, please contact this office.      Health Maintenance, Male A healthy lifestyle and preventive care is important for your health and wellness. Ask your health care provider about what schedule of regular examinations is right for you. What should I know about weight and diet? Eat a Healthy Diet  Eat plenty of vegetables, fruits, whole grains, low-fat dairy products, and lean protein.  Do not eat a lot of foods high in solid fats, added sugars, or salt.  Maintain a Healthy Weight Regular exercise can help you achieve or maintain a healthy weight. You should:  Do at least 150 minutes of exercise each week. The exercise should increase your heart rate and make you sweat (moderate-intensity exercise).  Do strength-training exercises at least twice a week.  Watch Your Levels of Cholesterol and Blood Lipids  Have your blood tested for lipids and cholesterol every 5 years starting at 56 years of age. If you are at high risk for heart disease, you should start having your blood tested when you are 56 years old. You may need to have your cholesterol levels checked more often if: ? Your lipid or cholesterol levels are high. ? You are older than 56 years of age. ? You are at high risk for heart disease.  What should I know about cancer screening? Many types of cancers can be detected early and may often be prevented. Lung Cancer  You should be screened every year for lung cancer if: ? You are a current smoker who has smoked for at least 30 years. ? You are a former smoker who has quit within the past 15 years.  Talk to your health care provider about your screening options, when you should start screening, and how often you should be screened.  Colorectal Cancer  Routine colorectal cancer screening usually begins at 56 years of age and should be repeated every 5-10  years until you are 56 years old. You may need to be screened more often if early forms of precancerous polyps or small growths are found. Your health care provider may recommend screening at an earlier age if you have risk factors for colon cancer.  Your health care provider may recommend using home test kits to  check for hidden blood in the stool.  A small camera at the end of a tube can be used to examine your colon (sigmoidoscopy or colonoscopy). This checks for the earliest forms of colorectal cancer.  Prostate and Testicular Cancer  Depending on your age and overall health, your health care provider may do certain tests to screen for prostate and testicular cancer.  Talk to your health care provider about any symptoms or concerns you have about testicular or prostate cancer.  Skin Cancer  Check your skin from head to toe regularly.  Tell your health care provider about any new moles or changes in moles, especially if: ? There is a change in a mole's size, shape, or color. ? You have a mole that is larger than a pencil eraser.  Always use sunscreen. Apply sunscreen liberally and repeat throughout the day.  Protect yourself by wearing long sleeves, pants, a wide-brimmed hat, and sunglasses when outside.  What should I know about heart disease, diabetes, and high blood pressure?  If you are 35-64 years of age, have your blood pressure checked every 3-5 years. If you are 18 years of age or older, have your blood pressure checked every year. You should have your blood pressure measured twice-once when you are at a hospital or clinic, and once when you are not at a hospital or clinic. Record the average of the two measurements. To check your blood pressure when you are not at a hospital or clinic, you can use: ? An automated blood pressure machine at a pharmacy. ? A home blood pressure monitor.  Talk to your health care provider about your target blood pressure.  If you are between  40-71 years old, ask your health care provider if you should take aspirin to prevent heart disease.  Have regular diabetes screenings by checking your fasting blood sugar level. ? If you are at a normal weight and have a low risk for diabetes, have this test once every three years after the age of 39. ? If you are overweight and have a high risk for diabetes, consider being tested at a younger age or more often.  A one-time screening for abdominal aortic aneurysm (AAA) by ultrasound is recommended for men aged 25-75 years who are current or former smokers. What should I know about preventing infection? Hepatitis B If you have a higher risk for hepatitis B, you should be screened for this virus. Talk with your health care provider to find out if you are at risk for hepatitis B infection. Hepatitis C Blood testing is recommended for:  Everyone born from [birth year] through 1964.  Anyone with known risk factors for hepatitis C.  Sexually Transmitted Diseases (STDs)  You should be screened each year for STDs including gonorrhea and chlamydia if: ? You are sexually active and are younger than 56 years of age. ? You are older than 56 years of age and your health care provider tells you that you are at risk for this type of infection. ? Your sexual activity has changed since you were last screened and you are at an increased risk for chlamydia or gonorrhea. Ask your health care provider if you are at risk.  Talk with your health care provider about whether you are at high risk of being infected with HIV. Your health care provider may recommend a prescription medicine to help prevent HIV infection.  What else can I do?  Schedule regular health, dental, and eye exams.  Stay current with  your vaccines (immunizations).  Do not use any tobacco products, such as cigarettes, chewing tobacco, and e-cigarettes. If you need help quitting, ask your health care provider.  Limit alcohol intake to no more than  2 drinks per day. One drink equals 12 ounces of beer, 5 ounces of wine, or 1 ounces of hard liquor.  Do not use street drugs.  Do not share needles.  Ask your health care provider for help if you need support or information about quitting drugs.  Tell your health care provider if you often feel depressed.  Tell your health care provider if you have ever been abused or do not feel safe at home. This information is not intended to replace advice given to you by your health care provider. Make sure you discuss any questions you have with your health care provider. Document Released: 06/10/2008 Document Revised: 08/11/2016 Document Reviewed: 09/16/2015 Elsevier Interactive Patient Education  2018 Elsevier Inc.      Agustina Caroli, MD [facility]

## 2018-01-13 NOTE — Patient Instructions (Addendum)
   IF you received an x-ray today, you will receive an invoice from [facility]. Please contact [facility] at [phone] with questions or concerns regarding your invoice.   IF you received labwork today, you will receive an invoice from [facility]. Please contact [facility] at [phone] with questions or concerns regarding your invoice.   Our billing staff will not be able to assist you with questions regarding bills from these companies.  You will be contacted with the lab results as soon as they are available. The fastest way to get your results is to activate your My Chart account. Instructions are located on the last page of this paperwork. If you have not heard from us regarding the results in 2 weeks, please contact this office.      Health Maintenance, Male A healthy lifestyle and preventive care is important for your health and wellness. Ask your health care provider about what schedule of regular examinations is right for you. What should I know about weight and diet? Eat a Healthy Diet  Eat plenty of vegetables, fruits, whole grains, low-fat dairy products, and lean protein.  Do not eat a lot of foods high in solid fats, added sugars, or salt.  Maintain a Healthy Weight Regular exercise can help you achieve or maintain a healthy weight. You should:  Do at least 150 minutes of exercise each week. The exercise should increase your heart rate and make you sweat (moderate-intensity exercise).  Do strength-training exercises at least twice a week.  Watch Your Levels of Cholesterol and Blood Lipids  Have your blood tested for lipids and cholesterol every 5 years starting at 56 years of age. If you are at high risk for heart disease, you should start having your blood tested when you are 56 years old. You may need to have your cholesterol levels checked more often if: ? Your lipid or cholesterol levels are high. ? You are older than 56 years of age. ? You  are at high risk for heart disease.  What should I know about cancer screening? Many types of cancers can be detected early and may often be prevented. Lung Cancer  You should be screened every year for lung cancer if: ? You are a current smoker who has smoked for at least 30 years. ? You are a former smoker who has quit within the past 15 years.  Talk to your health care provider about your screening options, when you should start screening, and how often you should be screened.  Colorectal Cancer  Routine colorectal cancer screening usually begins at 56 years of age and should be repeated every 5-10 years until you are 56 years old. You may need to be screened more often if early forms of precancerous polyps or small growths are found. Your health care provider may recommend screening at an earlier age if you have risk factors for colon cancer.  Your health care provider may recommend using home test kits to check for hidden blood in the stool.  A small camera at the end of a tube can be used to examine your colon (sigmoidoscopy or colonoscopy). This checks for the earliest forms of colorectal cancer.  Prostate and Testicular Cancer  Depending on your age and overall health, your health care provider may do certain tests to screen for prostate and testicular cancer.  Talk to your health care provider about any symptoms or concerns you have about testicular or prostate cancer.  Skin Cancer  Check your skin   from head to toe regularly.  Tell your health care provider about any new moles or changes in moles, especially if: ? There is a change in a mole's size, shape, or color. ? You have a mole that is larger than a pencil eraser.  Always use sunscreen. Apply sunscreen liberally and repeat throughout the day.  Protect yourself by wearing long sleeves, pants, a wide-brimmed hat, and sunglasses when outside.  What should I know about heart disease, diabetes, and high blood  pressure?  If you are 18-39 years of age, have your blood pressure checked every 3-5 years. If you are 40 years of age or older, have your blood pressure checked every year. You should have your blood pressure measured twice-once when you are at a hospital or clinic, and once when you are not at a hospital or clinic. Record the average of the two measurements. To check your blood pressure when you are not at a hospital or clinic, you can use: ? An automated blood pressure machine at a pharmacy. ? A home blood pressure monitor.  Talk to your health care provider about your target blood pressure.  If you are between 45-79 years old, ask your health care provider if you should take aspirin to prevent heart disease.  Have regular diabetes screenings by checking your fasting blood sugar level. ? If you are at a normal weight and have a low risk for diabetes, have this test once every three years after the age of 45. ? If you are overweight and have a high risk for diabetes, consider being tested at a younger age or more often.  A one-time screening for abdominal aortic aneurysm (AAA) by ultrasound is recommended for men aged 65-75 years who are current or former smokers. What should I know about preventing infection? Hepatitis B If you have a higher risk for hepatitis B, you should be screened for this virus. Talk with your health care provider to find out if you are at risk for hepatitis B infection. Hepatitis C Blood testing is recommended for:  Everyone born from 1945 through 1965.  Anyone with known risk factors for hepatitis C.  Sexually Transmitted Diseases (STDs)  You should be screened each year for STDs including gonorrhea and chlamydia if: ? You are sexually active and are younger than 56 years of age. ? You are older than 56 years of age and your health care provider tells you that you are at risk for this type of infection. ? Your sexual activity has changed since you were last  screened and you are at an increased risk for chlamydia or gonorrhea. Ask your health care provider if you are at risk.  Talk with your health care provider about whether you are at high risk of being infected with HIV. Your health care provider may recommend a prescription medicine to help prevent HIV infection.  What else can I do?  Schedule regular health, dental, and eye exams.  Stay current with your vaccines (immunizations).  Do not use any tobacco products, such as cigarettes, chewing tobacco, and e-cigarettes. If you need help quitting, ask your health care provider.  Limit alcohol intake to no more than 2 drinks per day. One drink equals 12 ounces of beer, 5 ounces of wine, or 1 ounces of hard liquor.  Do not use street drugs.  Do not share needles.  Ask your health care provider for help if you need support or information about quitting drugs.  Tell your health care   provider if you often feel depressed.  Tell your health care provider if you have ever been abused or do not feel safe at home. This information is not intended to replace advice given to you by your health care provider. Make sure you discuss any questions you have with your health care provider. Document Released: 06/10/2008 Document Revised: 08/11/2016 Document Reviewed: 09/16/2015 Elsevier Interactive Patient Education  2018 Elsevier Inc.  

## 2018-01-14 LAB — COMPREHENSIVE METABOLIC PANEL
ALK PHOS: 111 IU/L (ref 39–117)
ALT: 43 IU/L (ref 0–44)
AST: 47 IU/L — ABNORMAL HIGH (ref 0–40)
Albumin/Globulin Ratio: 1.6 (ref 1.2–2.2)
Albumin: 4.2 g/dL (ref 3.5–5.5)
BILIRUBIN TOTAL: 0.3 mg/dL (ref 0.0–1.2)
BUN/Creatinine Ratio: 12 (ref 9–20)
BUN: 28 mg/dL — ABNORMAL HIGH (ref 6–24)
CO2: 19 mmol/L — AB (ref 20–29)
CREATININE: 2.34 mg/dL — AB (ref 0.76–1.27)
Calcium: 10.6 mg/dL — ABNORMAL HIGH (ref 8.7–10.2)
Chloride: 105 mmol/L (ref 96–106)
GFR calc Af Amer: 35 mL/min/{1.73_m2} — ABNORMAL LOW (ref >59)
GFR calc non Af Amer: 30 mL/min/{1.73_m2} — ABNORMAL LOW (ref >59)
GLOBULIN, TOTAL: 2.7 g/dL (ref 1.5–4.5)
GLUCOSE: 92 mg/dL (ref 65–99)
Potassium: 4.7 mmol/L (ref 3.5–5.2)
SODIUM: 141 mmol/L (ref 134–144)
Total Protein: 6.9 g/dL (ref 6.0–8.5)

## 2018-01-14 LAB — CBC WITH DIFFERENTIAL/PLATELET
BASOS ABS: 0 10*3/uL (ref 0.0–0.2)
Basos: 0 %
EOS (ABSOLUTE): 0.2 10*3/uL (ref 0.0–0.4)
Eos: 2 %
HEMATOCRIT: 42.2 % (ref 37.5–51.0)
Hemoglobin: 14.1 g/dL (ref 13.0–17.7)
Immature Grans (Abs): 0 10*3/uL (ref 0.0–0.1)
Immature Granulocytes: 0 %
LYMPHS ABS: 2.1 10*3/uL (ref 0.7–3.1)
Lymphs: 32 %
MCH: 32 pg (ref 26.6–33.0)
MCHC: 33.4 g/dL (ref 31.5–35.7)
MCV: 96 fL (ref 79–97)
MONOS ABS: 0.6 10*3/uL (ref 0.1–0.9)
Monocytes: 9 %
Neutrophils Absolute: 3.7 10*3/uL (ref 1.4–7.0)
Neutrophils: 57 %
Platelets: 192 10*3/uL (ref 150–379)
RBC: 4.4 x10E6/uL (ref 4.14–5.80)
RDW: 13.9 % (ref 12.3–15.4)
WBC: 6.6 10*3/uL (ref 3.4–10.8)

## 2018-01-14 LAB — LIPID PANEL
CHOL/HDL RATIO: 3.8 ratio (ref 0.0–5.0)
CHOLESTEROL TOTAL: 163 mg/dL (ref 100–199)
HDL: 43 mg/dL (ref >39)
LDL Calculated: 94 mg/dL (ref 0–99)
TRIGLYCERIDES: 129 mg/dL (ref 0–149)
VLDL CHOLESTEROL CAL: 26 mg/dL (ref 5–40)

## 2018-01-14 LAB — HEMOGLOBIN A1C
Est. average glucose Bld gHb Est-mCnc: 126 mg/dL
Hgb A1c MFr Bld: 6 % — ABNORMAL HIGH (ref 4.8–5.6)

## 2018-01-14 LAB — PSA: Prostate Specific Ag, Serum: 1.7 ng/mL (ref 0.0–4.0)

## 2018-01-14 LAB — HEPATITIS C ANTIBODY: Hep C Virus Ab: <0.1 s/co ratio (ref 0.0–0.9)

## 2018-01-14 LAB — HIV ANTIBODY (ROUTINE TESTING W REFLEX): HIV Screen 4th Generation wRfx: NONREACTIVE

## 2018-01-16 ENCOUNTER — Other Ambulatory Visit: Payer: Self-pay | Admitting: Emergency Medicine

## 2018-01-16 DIAGNOSIS — N183 Chronic kidney disease, stage 3 unspecified: Secondary | ICD-10-CM

## 2018-01-24 ENCOUNTER — Other Ambulatory Visit: Payer: Self-pay | Admitting: Emergency Medicine

## 2018-02-15 DIAGNOSIS — M109 Gout, unspecified: Secondary | ICD-10-CM | POA: Diagnosis not present

## 2018-02-15 DIAGNOSIS — N183 Chronic kidney disease, stage 3 (moderate): Secondary | ICD-10-CM | POA: Diagnosis not present

## 2018-02-15 DIAGNOSIS — I4891 Unspecified atrial fibrillation: Secondary | ICD-10-CM | POA: Diagnosis not present

## 2018-03-11 ENCOUNTER — Other Ambulatory Visit: Payer: Self-pay | Admitting: Physician Assistant

## 2018-03-11 DIAGNOSIS — M10471 Other secondary gout, right ankle and foot: Secondary | ICD-10-CM

## 2018-03-27 ENCOUNTER — Encounter: Payer: Self-pay | Admitting: Physician Assistant

## 2018-08-07 DIAGNOSIS — N183 Chronic kidney disease, stage 3 (moderate): Secondary | ICD-10-CM | POA: Diagnosis not present

## 2018-08-07 DIAGNOSIS — I129 Hypertensive chronic kidney disease with stage 1 through stage 4 chronic kidney disease, or unspecified chronic kidney disease: Secondary | ICD-10-CM | POA: Diagnosis not present

## 2018-08-07 DIAGNOSIS — I4891 Unspecified atrial fibrillation: Secondary | ICD-10-CM | POA: Diagnosis not present

## 2018-12-21 ENCOUNTER — Other Ambulatory Visit: Payer: Self-pay | Admitting: Emergency Medicine

## 2018-12-21 NOTE — Telephone Encounter (Signed)
Patient called and advised an appointment is required in order to refill medications, he verbalized understanding. Appointment scheduled for Monday, 01/15/19 at 1320 with Dr. Mitchel Honour for a physical/labs.

## 2019-01-16 ENCOUNTER — Other Ambulatory Visit: Payer: Self-pay

## 2019-01-16 ENCOUNTER — Ambulatory Visit (INDEPENDENT_AMBULATORY_CARE_PROVIDER_SITE_OTHER): Payer: BLUE CROSS/BLUE SHIELD | Admitting: Emergency Medicine

## 2019-01-16 ENCOUNTER — Encounter: Payer: Self-pay | Admitting: Emergency Medicine

## 2019-01-16 VITALS — BP 154/110 | HR 61 | Temp 98.8°F | Resp 16 | Ht 72.5 in | Wt 238.6 lb

## 2019-01-16 DIAGNOSIS — N183 Chronic kidney disease, stage 3 unspecified: Secondary | ICD-10-CM

## 2019-01-16 DIAGNOSIS — I4821 Permanent atrial fibrillation: Secondary | ICD-10-CM | POA: Diagnosis not present

## 2019-01-16 DIAGNOSIS — E785 Hyperlipidemia, unspecified: Secondary | ICD-10-CM

## 2019-01-16 DIAGNOSIS — M10471 Other secondary gout, right ankle and foot: Secondary | ICD-10-CM

## 2019-01-16 DIAGNOSIS — I10 Essential (primary) hypertension: Secondary | ICD-10-CM | POA: Diagnosis not present

## 2019-01-16 MED ORDER — RIVAROXABAN 20 MG PO TABS: ORAL_TABLET | ORAL | 3 refills | Status: DC

## 2019-01-16 MED ORDER — NEBIVOLOL HCL 10 MG PO TABS: 10.0000 mg | ORAL_TABLET | Freq: Every day | ORAL | 3 refills | Status: DC

## 2019-01-16 MED ORDER — LISINOPRIL 10 MG PO TABS: 10.0000 mg | ORAL_TABLET | Freq: Every day | ORAL | 3 refills | Status: DC

## 2019-01-16 MED ORDER — COLCHICINE 0.6 MG PO TABS: ORAL_TABLET | ORAL | 1 refills | Status: DC

## 2019-01-16 MED ORDER — ALLOPURINOL 100 MG PO TABS: 50.0000 mg | ORAL_TABLET | Freq: Every day | ORAL | 6 refills | Status: DC

## 2019-01-16 NOTE — Progress Notes (Addendum)
BP Readings from Last 3 Encounters:  01/13/18 (!) 130/100  11/30/17 136/84  01/15/17 (!) 122/98   Isaiah [name] 58 y.o.   Chief Complaint  Patient presents with  . Annual Exam    HISTORY OF PRESENT ILLNESS: This is a 58 y.o. male with history of hypertension, chronic kidney disease, chronic A. fib, dyslipidemia, here for follow-up and medication refill.  Has no complaints or medical concerns today.  HPI   Prior to Admission medications   Medication Sig Start Date End Date Taking? Authorizing Provider  allopurinol (ZYLOPRIM) 100 MG tablet Take 0.5 tablets (50 mg total) by mouth daily. 01/10/17  Yes Anderia Lorenzo, Ines Bloomer, MD  BYSTOLIC 10 MG tablet TAKE 1 TABLET BY MOUTH ONCE DAILY 12/21/18  Yes Adaline Trejos, Ines Bloomer, MD  colchicine 0.6 MG tablet Take 2 tabs once and then 1 tab in an hour.  After that, take 1 tab po bid 11/30/17  Yes English, Stephanie D, PA  Multiple Vitamin (MULTIVITAMIN WITH MINERALS) TABS Take 1 tablet by mouth daily. Reported on 07/14/2016   Yes [provider]  XARELTO 20 MG TABS tablet TAKE 1 TABLET BY MOUTH ONCE DAILY WITH SUPPER 12/21/18  Yes Richelle Glick, Ines Bloomer, MD    Allergies  Allergen Reactions  . Ace Inhibitors Swelling    angioedema  . Codeine Itching    Patient Active Problem List   Diagnosis Date Noted  . Hypertensive heart disease   . Drug therapy 02/02/2015  . Dyslipidemia 02/02/2015  . Obesity (BMI 30-39.9) 09/10/2013  . Gout 03/09/2013  . Terminal aortic occlusion (HCC) 01/24/2013  . Atrial fibrillation, permanent 01/24/2013  . Essential hypertension 01/24/2013  . Erectile dysfunction 01/24/2013  . CAD (coronary artery disease), non obstructive by cardiac cath 2009 01/24/2013    Past Medical History:  Diagnosis Date  . Atrial fibrillation, permanent 01/24/2013   chronic,previuosly refuses coumadin,now on xarelto  . CKD (chronic kidney disease) stage 3, GFR 30-59 ml/min (HCC) 01/24/2013   Baseline creatinine roughly  1.7-1.8.  Marland Kitchen Erectile dysfunction   . Extremity ischemia, critical bil lower ext. 01/24/2013   Status post bilateral common femoral, profunda femoral and superficial femoral arterial embolectomy  . Gout   . H/O right and left cardiac catheterization November 2009   30-40% RCA disease, cardiac output Fick 6.3, thermodilution 5.3.  Normal artery pressures.  . History of Non-ischemic cardiomyopathy 2009 - 2014   EF previously as low as 35-40% in 2009, up to 40 and 45% by 2012.  Repeat echo January 2014: Echo EF 55-60% mild LVH, mild to moderate left atrial enlargement, mild right atrial enlargement.  . History of TIA (transient ischemic attack) 2005   Carotid Dopplers January 2014 negative for significant stenosis.  . Hypertension   . Hypertensive heart disease   . OSA on CPAP    use DME- Choice titration study was on 02/21/13  . Pinched nerve in neck   . Terminal aortic occlusion ([place]) 01/24/2013   Status post embolectomy    Past Surgical History:  Procedure Laterality Date  . CARDIAC CATHETERIZATION  Nov 2009   right and left  heart cath ,cardiac output 6.3 by FICK AND 5.34 by thermal  diluation.norm RV pressures and nonobstructive cor disease. no shunt.some intramyocardial bridgingof the LAD  . CAROTID DOPPLERS  Nov 2009   done for TIA which were normal  . DOPPLER ECHOCARDIOGRAPHY  02/2011; 12/2012   a) EF40-45%; LA mod to severe dilated ;; b) EF 55-60%, mild LVH, Mild-Mod LA dilation  .  EMBOLECTOMY  01/23/2013   Procedure: EMBOLECTOMY;  Surgeon: Serafina Mitchell, MD;  Location: [facility] [address] OR;  Service: Vascular;  Laterality: Bilateral;  Bilateral femoral Embolectomy, Bilateral Iliac Embolectomy.  Marland Kitchen KNEE SURGERY      Social History   Socioeconomic History  . Marital status: Divorced    Spouse name: Not on file  . Number of children: Not on file  . Years of education: Not on file  . Highest education level: Not on file  Occupational History  . Not on file  Social Needs  . Financial  resource strain: Not on file  . Food insecurity:    Worry: Not on file    Inability: Not on file  . Transportation needs:    Medical: Not on file    Non-medical: Not on file  Tobacco Use  . Smoking status: Never Smoker  . Smokeless tobacco: Never Used  Substance and Sexual Activity  . Alcohol use: No    Alcohol/week: 0.0 standard drinks  . Drug use: No  . Sexual activity: Yes    Comment: married  Lifestyle  . Physical activity:    Days per week: Not on file    Minutes per session: Not on file  . Stress: Not on file  Relationships  . Social connections:    Talks on phone: Not on file    Gets together: Not on file    Attends religious service: Not on file    Active member of club or organization: Not on file    Attends meetings of clubs or organizations: Not on file    Relationship status: Not on file  . Intimate partner violence:    Fear of current or ex partner: Not on file    Emotionally abused: Not on file    Physically abused: Not on file    Forced sexual activity: Not on file  Other Topics Concern  . Not on file  Social History Narrative  . Not on file    Family History  Problem Relation Age of Onset  . Hypertension Mother   . Hypertension Father      Review of Systems  Constitutional: Negative.  Negative for chills and fever.  HENT: Negative.  Negative for sinus pain and sore throat.   Eyes: Negative.  Negative for blurred vision and double vision.  Respiratory: Negative.  Negative for cough and shortness of breath.   Cardiovascular: Negative.  Negative for chest pain and palpitations.  Gastrointestinal: Negative.  Negative for abdominal pain, heartburn, nausea and vomiting.  Genitourinary: Negative.  Negative for dysuria.  Musculoskeletal: Negative.   Skin: Negative.  Negative for rash.  Neurological: Negative.  Negative for dizziness and headaches.  Endo/Heme/Allergies: Negative.   All other systems reviewed and are negative.   Vitals:   01/16/19  1324  BP: (!) 166/119  Pulse: 61  Resp: 16  Temp: 98.8 F (37.1 C)  SpO2: 97%    Physical Exam Constitutional:      Appearance: Normal appearance.  HENT:     Head: Normocephalic and atraumatic.     Nose: Nose normal.  Eyes:     Extraocular Movements: Extraocular movements intact.     Conjunctiva/sclera: Conjunctivae normal.     Pupils: Pupils are equal, round, and reactive to light.  Neck:     Musculoskeletal: Normal range of motion and neck supple.  Cardiovascular:     Rate and Rhythm: Normal rate. Rhythm regularly irregular.     Heart sounds: Normal heart sounds. No murmur.  Pulmonary:     Breath sounds: Normal breath sounds.  Abdominal:     General: There is no distension.     Palpations: Abdomen is soft.     Tenderness: There is no abdominal tenderness.  Musculoskeletal: Normal range of motion.  Skin:    General: Skin is warm and dry.     Capillary Refill: Capillary refill takes less than 2 seconds.  Neurological:     General: No focal deficit present.     Mental Status: He is alert and oriented to person, place, and time.  Psychiatric:        Mood and Affect: Mood normal.        Behavior: Behavior normal.      ASSESSMENT & PLAN: Isaiah [name] was seen today for annual exam and medication refill.  Diagnoses and all orders for this visit:  Essential hypertension -     CBC with Differential/Platelet -     Comprehensive metabolic panel -     Lipid panel -     Hemoglobin A1c  Atrial fibrillation, permanent  CKD (chronic kidney disease) stage 3, GFR 30-59 ml/min (HCC)  Dyslipidemia -     Lipid panel -     Hemoglobin A1c  Acute gout due to other secondary cause involving toe of right foot -     colchicine 0.6 MG tablet; Take 2 tabs once and then 1 tab in an hour.  After that, take 1 tab po bid  Other orders -     nebivolol (BYSTOLIC) 10 MG tablet; Take 1 tablet (10 mg total) by mouth daily. -     rivaroxaban (XARELTO) 20 MG TABS tablet; TAKE 1 TABLET BY  MOUTH ONCE DAILY WITH SUPPER -     allopurinol (ZYLOPRIM) 100 MG tablet; Take 0.5 tablets (50 mg total) by mouth daily. -     lisinopril (PRINIVIL,ZESTRIL) 10 MG tablet; Take 1 tablet (10 mg total) by mouth daily.    Patient Instructions       If you have lab work done today you will be contacted with your lab results within the next 2 weeks.  If you have not heard from [place] then please contact us. The fastest way to get your results is to register for My Chart.   IF you received an x-ray today, you will receive an invoice from [facility]. Please contact [facility] at [phone] with questions or concerns regarding your invoice.   IF you received labwork today, you will receive an invoice from [place]. Please contact [facility] at [phone] with questions or concerns regarding your invoice.   Our billing staff will not be able to assist you with questions regarding bills from these companies.  You will be contacted with the lab results as soon as they are available. The fastest way to get your results is to activate your My Chart account. Instructions are located on the last page of this paperwork. If you have not heard from [place] regarding the results in 2 weeks, please contact this office.     Hypertension Hypertension, commonly called high blood pressure, is when the force of blood pumping through the arteries is too strong. The arteries are the blood vessels that carry blood from the heart throughout the body. Hypertension forces the heart to work harder to pump blood and may cause arteries to become narrow or stiff. Having untreated or uncontrolled hypertension can cause heart attacks, strokes, kidney disease, and other problems. A blood pressure reading consists of a higher number  over a lower number. Ideally, your blood pressure should be below 120/80. The first ("top") number is called the systolic pressure. It is a measure of the pressure in your arteries as  your heart beats. The second ("bottom") number is called the diastolic pressure. It is a measure of the pressure in your arteries as the heart relaxes. What are the causes? The cause of this condition is not known. What increases the risk? Some risk factors for high blood pressure are under your control. Others are not. Factors you can change  Smoking.  Having type 2 diabetes mellitus, high cholesterol, or both.  Not getting enough exercise or physical activity.  Being overweight.  Having too much fat, sugar, calories, or salt (sodium) in your diet.  Drinking too much alcohol. Factors that are difficult or impossible to change  Having chronic kidney disease.  Having a family history of high blood pressure.  Age. Risk increases with age.  Race. You may be at higher risk if you are African-American.  Gender. Men are at higher risk than women before age 70. After age 82, women are at higher risk than men.  Having obstructive sleep apnea.  Stress. What are the signs or symptoms? Extremely high blood pressure (hypertensive crisis) may cause:  Headache.  Anxiety.  Shortness of breath.  Nosebleed.  Nausea and vomiting.  Severe chest pain.  Jerky movements you cannot control (seizures). How is this diagnosed? This condition is diagnosed by measuring your blood pressure while you are seated, with your arm resting on a surface. The cuff of the blood pressure monitor will be placed directly against the skin of your upper arm at the level of your heart. It should be measured at least twice using the same arm. Certain conditions can cause a difference in blood pressure between your right and left arms. Certain factors can cause blood pressure readings to be lower or higher than normal (elevated) for a short period of time:  When your blood pressure is higher when you are in a health care provider's office than when you are at home, this is called white coat hypertension. Most  people with this condition do not need medicines.  When your blood pressure is higher at home than when you are in a health care provider's office, this is called masked hypertension. Most people with this condition may need medicines to control blood pressure. If you have a high blood pressure reading during one visit or you have normal blood pressure with other risk factors:  You may be asked to return on a different day to have your blood pressure checked again.  You may be asked to monitor your blood pressure at home for 1 week or longer. If you are diagnosed with hypertension, you may have other blood or imaging tests to help your health care provider understand your overall risk for other conditions. How is this treated? This condition is treated by making healthy lifestyle changes, such as eating healthy foods, exercising more, and reducing your alcohol intake. Your health care provider may prescribe medicine if lifestyle changes are not enough to get your blood pressure under control, and if:  Your systolic blood pressure is above 130.  Your diastolic blood pressure is above 80. Your personal target blood pressure may vary depending on your medical conditions, your age, and other factors. Follow these instructions at home: Eating and drinking   Eat a diet that is high in fiber and potassium, and low in sodium, added sugar,  and fat. An example eating plan is called the DASH (Dietary Approaches to Stop Hypertension) diet. To eat this way: ? Eat plenty of fresh fruits and vegetables. Try to fill half of your plate at each meal with fruits and vegetables. ? Eat whole grains, such as whole wheat pasta, brown rice, or whole grain bread. Fill about one quarter of your plate with whole grains. ? Eat or drink low-fat dairy products, such as skim milk or low-fat yogurt. ? Avoid fatty cuts of meat, processed or cured meats, and poultry with skin. Fill about one quarter of your plate with lean  proteins, such as fish, chicken without skin, beans, eggs, and tofu. ? Avoid premade and processed foods. These tend to be higher in sodium, added sugar, and fat.  Reduce your daily sodium intake. Most people with hypertension should eat less than 1,500 mg of sodium a day.  Limit alcohol intake to no more than 1 drink a day for nonpregnant women and 2 drinks a day for men. One drink equals 12 oz of beer, 5 oz of wine, or 1 oz of hard liquor. Lifestyle   Work with your health care provider to maintain a healthy body weight or to lose weight. Ask what an ideal weight is for you.  Get at least 30 minutes of exercise that causes your heart to beat faster (aerobic exercise) most days of the week. Activities may include walking, swimming, or biking.  Include exercise to strengthen your muscles (resistance exercise), such as pilates or lifting weights, as part of your weekly exercise routine. Try to do these types of exercises for 30 minutes at least 3 days a week.  Do not use any products that contain nicotine or tobacco, such as cigarettes and e-cigarettes. If you need help quitting, ask your health care provider.  Monitor your blood pressure at home as told by your health care provider.  Keep all follow-up visits as told by your health care provider. This is important. Medicines  Take over-the-counter and prescription medicines only as told by your health care provider. Follow directions carefully. Blood pressure medicines must be taken as prescribed.  Do not skip doses of blood pressure medicine. Doing this puts you at risk for problems and can make the medicine less effective.  Ask your health care provider about side effects or reactions to medicines that you should watch for. Contact a health care provider if:  You think you are having a reaction to a medicine you are taking.  You have headaches that keep coming back (recurring).  You feel dizzy.  You have swelling in your  ankles.  You have trouble with your vision. Get help right away if:  You develop a severe headache or confusion.  You have unusual weakness or numbness.  You feel faint.  You have severe pain in your chest or abdomen.  You vomit repeatedly.  You have trouble breathing. Summary  Hypertension is when the force of blood pumping through your arteries is too strong. If this condition is not controlled, it may put you at risk for serious complications.  Your personal target blood pressure may vary depending on your medical conditions, your age, and other factors. For most people, a normal blood pressure is less than 120/80.  Hypertension is treated with lifestyle changes, medicines, or a combination of both. Lifestyle changes include weight loss, eating a healthy, low-sodium diet, exercising more, and limiting alcohol. This information is not intended to replace advice given to you by  your health care provider. Make sure you discuss any questions you have with your health care provider. Document Released: 12/13/2005 Document Revised: 11/10/2016 Document Reviewed: 11/10/2016 Elsevier Interactive Patient Education  2019 [employer]      Agustina Caroli, MD [facility]

## 2019-01-16 NOTE — Patient Instructions (Addendum)
   If you have lab work done today you will be contacted with your lab results within the next 2 weeks.  If you have not heard from us then please contact us. The fastest way to get your results is to register for My Chart.   IF you received an x-ray today, you will receive an invoice from [facility]. Please contact [facility] at [phone] with questions or concerns regarding your invoice.   IF you received labwork today, you will receive an invoice from [facility]. Please contact [facility] at [phone] with questions or concerns regarding your invoice.   Our billing staff will not be able to assist you with questions regarding bills from these companies.  You will be contacted with the lab results as soon as they are available. The fastest way to get your results is to activate your My Chart account. Instructions are located on the last page of this paperwork. If you have not heard from us regarding the results in 2 weeks, please contact this office.       Hypertension Hypertension, commonly called high blood pressure, is when the force of blood pumping through the arteries is too strong. The arteries are the blood vessels that carry blood from the heart throughout the body. Hypertension forces the heart to work harder to pump blood and may cause arteries to become narrow or stiff. Having untreated or uncontrolled hypertension can cause heart attacks, strokes, kidney disease, and other problems. A blood pressure reading consists of a higher number over a lower number. Ideally, your blood pressure should be below 120/80. The first ("top") number is called the systolic pressure. It is a measure of the pressure in your arteries as your heart beats. The second ("bottom") number is called the diastolic pressure. It is a measure of the pressure in your arteries as the heart relaxes. What are the causes? The cause of this condition is not known. What increases the  risk? Some risk factors for high blood pressure are under your control. Others are not. Factors you can change  Smoking.  Having type 2 diabetes mellitus, high cholesterol, or both.  Not getting enough exercise or physical activity.  Being overweight.  Having too much fat, sugar, calories, or salt (sodium) in your diet.  Drinking too much alcohol. Factors that are difficult or impossible to change  Having chronic kidney disease.  Having a family history of high blood pressure.  Age. Risk increases with age.  Race. You may be at higher risk if you are African-American.  Gender. Men are at higher risk than women before age 45. After age 65, women are at higher risk than men.  Having obstructive sleep apnea.  Stress. What are the signs or symptoms? Extremely high blood pressure (hypertensive crisis) may cause:  Headache.  Anxiety.  Shortness of breath.  Nosebleed.  Nausea and vomiting.  Severe chest pain.  Jerky movements you cannot control (seizures). How is this diagnosed? This condition is diagnosed by measuring your blood pressure while you are seated, with your arm resting on a surface. The cuff of the blood pressure monitor will be placed directly against the skin of your upper arm at the level of your heart. It should be measured at least twice using the same arm. Certain conditions can cause a difference in blood pressure between your right and left arms. Certain factors can cause blood pressure readings to be lower or higher than normal (elevated) for a short period of time:    When your blood pressure is higher when you are in a health care provider's office than when you are at home, this is called white coat hypertension. Most people with this condition do not need medicines.  When your blood pressure is higher at home than when you are in a health care provider's office, this is called masked hypertension. Most people with this condition may need medicines  to control blood pressure. If you have a high blood pressure reading during one visit or you have normal blood pressure with other risk factors:  You may be asked to return on a different day to have your blood pressure checked again.  You may be asked to monitor your blood pressure at home for 1 week or longer. If you are diagnosed with hypertension, you may have other blood or imaging tests to help your health care provider understand your overall risk for other conditions. How is this treated? This condition is treated by making healthy lifestyle changes, such as eating healthy foods, exercising more, and reducing your alcohol intake. Your health care provider may prescribe medicine if lifestyle changes are not enough to get your blood pressure under control, and if:  Your systolic blood pressure is above 130.  Your diastolic blood pressure is above 80. Your personal target blood pressure may vary depending on your medical conditions, your age, and other factors. Follow these instructions at home: Eating and drinking   Eat a diet that is high in fiber and potassium, and low in sodium, added sugar, and fat. An example eating plan is called the DASH (Dietary Approaches to Stop Hypertension) diet. To eat this way: ? Eat plenty of fresh fruits and vegetables. Try to fill half of your plate at each meal with fruits and vegetables. ? Eat whole grains, such as whole wheat pasta, brown rice, or whole grain bread. Fill about one quarter of your plate with whole grains. ? Eat or drink low-fat dairy products, such as skim milk or low-fat yogurt. ? Avoid fatty cuts of meat, processed or cured meats, and poultry with skin. Fill about one quarter of your plate with lean proteins, such as fish, chicken without skin, beans, eggs, and tofu. ? Avoid premade and processed foods. These tend to be higher in sodium, added sugar, and fat.  Reduce your daily sodium intake. Most people with hypertension should  eat less than 1,500 mg of sodium a day.  Limit alcohol intake to no more than 1 drink a day for nonpregnant women and 2 drinks a day for men. One drink equals 12 oz of beer, 5 oz of wine, or 1 oz of hard liquor. Lifestyle   Work with your health care provider to maintain a healthy body weight or to lose weight. Ask what an ideal weight is for you.  Get at least 30 minutes of exercise that causes your heart to beat faster (aerobic exercise) most days of the week. Activities may include walking, swimming, or biking.  Include exercise to strengthen your muscles (resistance exercise), such as pilates or lifting weights, as part of your weekly exercise routine. Try to do these types of exercises for 30 minutes at least 3 days a week.  Do not use any products that contain nicotine or tobacco, such as cigarettes and e-cigarettes. If you need help quitting, ask your health care provider.  Monitor your blood pressure at home as told by your health care provider.  Keep all follow-up visits as told by your health care provider.   This is important. Medicines  Take over-the-counter and prescription medicines only as told by your health care provider. Follow directions carefully. Blood pressure medicines must be taken as prescribed.  Do not skip doses of blood pressure medicine. Doing this puts you at risk for problems and can make the medicine less effective.  Ask your health care provider about side effects or reactions to medicines that you should watch for. Contact a health care provider if:  You think you are having a reaction to a medicine you are taking.  You have headaches that keep coming back (recurring).  You feel dizzy.  You have swelling in your ankles.  You have trouble with your vision. Get help right away if:  You develop a severe headache or confusion.  You have unusual weakness or numbness.  You feel faint.  You have severe pain in your chest or abdomen.  You vomit  repeatedly.  You have trouble breathing. Summary  Hypertension is when the force of blood pumping through your arteries is too strong. If this condition is not controlled, it may put you at risk for serious complications.  Your personal target blood pressure may vary depending on your medical conditions, your age, and other factors. For most people, a normal blood pressure is less than 120/80.  Hypertension is treated with lifestyle changes, medicines, or a combination of both. Lifestyle changes include weight loss, eating a healthy, low-sodium diet, exercising more, and limiting alcohol. This information is not intended to replace advice given to you by your health care provider. Make sure you discuss any questions you have with your health care provider. Document Released: 12/13/2005 Document Revised: 11/10/2016 Document Reviewed: 11/10/2016 Elsevier Interactive Patient Education  2019 [employer]  

## 2019-01-17 LAB — COMPREHENSIVE METABOLIC PANEL
ALBUMIN: 4 g/dL (ref 3.8–4.9)
ALT: 35 IU/L (ref 0–44)
AST: 37 IU/L (ref 0–40)
Albumin/Globulin Ratio: 1.7 (ref 1.2–2.2)
Alkaline Phosphatase: 116 IU/L (ref 39–117)
BUN / CREAT RATIO: 9 (ref 9–20)
BUN: 18 mg/dL (ref 6–24)
Bilirubin Total: 0.3 mg/dL (ref 0.0–1.2)
CO2: 21 mmol/L (ref 20–29)
Calcium: 9.9 mg/dL (ref 8.7–10.2)
Chloride: 108 mmol/L — ABNORMAL HIGH (ref 96–106)
Creatinine, Ser: 1.91 mg/dL — ABNORMAL HIGH (ref 0.76–1.27)
GFR calc Af Amer: 44 mL/min/{1.73_m2} — ABNORMAL LOW (ref >59)
GFR, EST NON AFRICAN AMERICAN: 38 mL/min/{1.73_m2} — AB (ref >59)
Globulin, Total: 2.3 g/dL (ref 1.5–4.5)
Glucose: 93 mg/dL (ref 65–99)
Potassium: 3.8 mmol/L (ref 3.5–5.2)
Sodium: 144 mmol/L (ref 134–144)
TOTAL PROTEIN: 6.3 g/dL (ref 6.0–8.5)

## 2019-01-17 LAB — HEMOGLOBIN A1C
ESTIMATED AVERAGE GLUCOSE: 123 mg/dL
Hgb A1c MFr Bld: 5.9 % — ABNORMAL HIGH (ref 4.8–5.6)

## 2019-01-17 LAB — CBC WITH DIFFERENTIAL/PLATELET
Basophils Absolute: 0.1 10*3/uL (ref 0.0–0.2)
Basos: 1 %
EOS (ABSOLUTE): 0.2 10*3/uL (ref 0.0–0.4)
Eos: 4 %
HEMOGLOBIN: 14.1 g/dL (ref 13.0–17.7)
Hematocrit: 41.3 % (ref 37.5–51.0)
IMMATURE GRANS (ABS): 0 10*3/uL (ref 0.0–0.1)
IMMATURE GRANULOCYTES: 0 %
Lymphocytes Absolute: 1.7 10*3/uL (ref 0.7–3.1)
Lymphs: 31 %
MCH: 32.6 pg (ref 26.6–33.0)
MCHC: 34.1 g/dL (ref 31.5–35.7)
MCV: 96 fL (ref 79–97)
MONOCYTES: 11 %
Monocytes Absolute: 0.6 10*3/uL (ref 0.1–0.9)
NEUTROS PCT: 53 %
Neutrophils Absolute: 2.9 10*3/uL (ref 1.4–7.0)
Platelets: 150 10*3/uL (ref 150–450)
RBC: 4.32 x10E6/uL (ref 4.14–5.80)
RDW: 12.4 % (ref 11.6–15.4)
WBC: 5.6 10*3/uL (ref 3.4–10.8)

## 2019-01-17 LAB — LIPID PANEL
CHOL/HDL RATIO: 3.5 ratio (ref 0.0–5.0)
Cholesterol, Total: 163 mg/dL (ref 100–199)
HDL: 47 mg/dL (ref >39)
LDL Calculated: 102 mg/dL — ABNORMAL HIGH (ref 0–99)
Triglycerides: 68 mg/dL (ref 0–149)
VLDL Cholesterol Cal: 14 mg/dL (ref 5–40)

## 2019-04-17 ENCOUNTER — Ambulatory Visit: Payer: Self-pay | Admitting: Emergency Medicine

## 2019-04-18 ENCOUNTER — Telehealth: Payer: Self-pay | Admitting: *Deleted

## 2019-04-18 ENCOUNTER — Telehealth: Payer: BLUE CROSS/BLUE SHIELD | Admitting: Emergency Medicine

## 2019-04-18 ENCOUNTER — Other Ambulatory Visit: Payer: Self-pay

## 2019-04-18 NOTE — Telephone Encounter (Signed)
Called patient to triage before WE[facility] appointment at 9:20 am. Left message in voice mail of mobile number to call back.

## 2019-04-18 NOTE — Telephone Encounter (Signed)
Called patient a second time to triage for 9:20 appointment, no answer. Left message to reschedule appointment.

## 2019-04-20 ENCOUNTER — Other Ambulatory Visit: Payer: Self-pay | Admitting: *Deleted

## 2019-04-20 DIAGNOSIS — E785 Hyperlipidemia, unspecified: Secondary | ICD-10-CM

## 2019-04-20 DIAGNOSIS — R7303 Prediabetes: Secondary | ICD-10-CM

## 2019-04-20 DIAGNOSIS — I10 Essential (primary) hypertension: Secondary | ICD-10-CM

## 2019-04-23 ENCOUNTER — Other Ambulatory Visit: Payer: Self-pay

## 2019-04-23 ENCOUNTER — Telehealth: Payer: Self-pay | Admitting: *Deleted

## 2019-04-23 ENCOUNTER — Telehealth (INDEPENDENT_AMBULATORY_CARE_PROVIDER_SITE_OTHER): Payer: BLUE CROSS/BLUE SHIELD | Admitting: Emergency Medicine

## 2019-04-23 ENCOUNTER — Encounter: Payer: Self-pay | Admitting: Emergency Medicine

## 2019-04-23 DIAGNOSIS — N183 Chronic kidney disease, stage 3 unspecified: Secondary | ICD-10-CM

## 2019-04-23 DIAGNOSIS — R7303 Prediabetes: Secondary | ICD-10-CM

## 2019-04-23 DIAGNOSIS — I119 Hypertensive heart disease without heart failure: Secondary | ICD-10-CM | POA: Diagnosis not present

## 2019-04-23 DIAGNOSIS — E785 Hyperlipidemia, unspecified: Secondary | ICD-10-CM

## 2019-04-23 DIAGNOSIS — I4821 Permanent atrial fibrillation: Secondary | ICD-10-CM

## 2019-04-23 NOTE — Telephone Encounter (Signed)
Called to triage patient for his appointment 9:40 am. No answer left message in mobile voice mail to call back.

## 2019-04-23 NOTE — Progress Notes (Signed)
Lab Results  Component Value Date   HGBA1C 5.9 (H) 01/16/2019   BP Readings from Last 3 Encounters:  01/16/19 (!) 154/110  01/13/18 (!) 130/100  11/30/17 136/84   Wt Readings from Last 3 Encounters:  01/16/19 238 lb 9.6 oz (108.2 kg)  01/13/18 238 lb (108 kg)  11/30/17 241 lb 12.8 oz (109.7 kg)   Lab Results  Component Value Date   CHOL 163 01/16/2019   HDL 47 01/16/2019   LDLCALC 102 (H) 01/16/2019   TRIG 68 01/16/2019   CHOLHDL 3.5 01/16/2019   The 10-year ASCVD risk score Isaiah [name]., et al., 2013) is: 15.3%   Values used to calculate the score:     Age: 58 years     Sex: Male     Is Non-Hispanic African American: Yes     Diabetic: No     Tobacco smoker: No     Systolic Blood Pressure: 308 mmHg     Is BP treated: Yes     HDL Cholesterol: 47 mg/dL     Total Cholesterol: 163 mg/dL Lab Results  Component Value Date   CREATININE 1.91 (H) 01/16/2019   BUN 18 01/16/2019   NA 144 01/16/2019   K 3.8 01/16/2019   CL 108 (H) 01/16/2019   CO2 21 01/16/2019      Telemedicine Encounter- SOAP NOTE Established Patient  This telephone encounter was conducted with the patient's (or proxy's) verbal consent via audio telecommunications: yes/no: Yes Patient was instructed to have this encounter in a suitably private space; and to only have persons present to whom they give permission to participate. In addition, patient identity was confirmed by use of name plus two identifiers (DOB and address).  I discussed the limitations, risks, security and privacy concerns of performing an evaluation and management service by telephone and the availability of in person appointments. I also discussed with the patient that there may be a patient responsible charge related to this service. The patient expressed understanding and agreed to proceed.  I spent a total of TIME; 0 MIN TO 60 MIN: 15 minutes talking with the patient or their proxy.  No chief complaint on file. Hypertension follow-up  Subjective   Isaiah [name] is a 58 y.o. male established patient. Telephone visit today for follow-up of chronic medical problems including hypertension and chronic A. fib.  Presently taking Bystolic, lisinopril, and Xarelto.  Doing well.  Has no complaints or medical concerns today. Blood pressure readings at home within normal limits. HPI   Patient Active Problem List   Diagnosis Date Noted  . Hypertensive heart disease   . Drug therapy 02/02/2015  . Dyslipidemia 02/02/2015  . Obesity (BMI 30-39.9) 09/10/2013  . Gout 03/09/2013  . Terminal aortic occlusion (HCC) 01/24/2013  . Atrial fibrillation, permanent 01/24/2013  . Essential hypertension 01/24/2013  . Erectile dysfunction 01/24/2013  . CAD (coronary artery disease), non obstructive by cardiac cath 2009 01/24/2013    Past Medical History:  Diagnosis Date  . Atrial fibrillation, permanent 01/24/2013   chronic,previuosly refuses coumadin,now on xarelto  . CKD (chronic kidney disease) stage 3, GFR 30-59 ml/min (HCC) 01/24/2013   Baseline creatinine roughly 1.7-1.8.  Marland Kitchen Erectile dysfunction   . Extremity ischemia, critical bil lower ext. 01/24/2013   Status post bilateral common femoral, profunda femoral and superficial femoral arterial embolectomy  . Gout   . H/O right and left cardiac catheterization November 2009   30-40% RCA disease, cardiac output Fick 6.3, thermodilution 5.3.  Normal artery  pressures.  Marland Kitchen Heart murmur   . History of Non-ischemic cardiomyopathy 2009 - 2014   EF previously as low as 35-40% in 2009, up to 40 and 45% by 2012.  Repeat echo January 2014: Echo EF 55-60% mild LVH, mild to moderate left atrial enlargement, mild right atrial enlargement.  . History of TIA (transient ischemic attack) 2005   Carotid Dopplers January 2014 negative for significant stenosis.  . Hypertension   . Hypertensive heart disease   . OSA on CPAP    use DME- Choice titration [name] was on 02/21/13  . Pinched nerve in neck   .  Terminal aortic occlusion ([place]) 01/24/2013   Status post embolectomy    Current Outpatient Medications  Medication Sig Dispense Refill  . lisinopril (PRINIVIL,ZESTRIL) 10 MG tablet Take 1 tablet (10 mg total) by mouth daily. 90 tablet 3  . Multiple Vitamin (MULTIVITAMIN WITH MINERALS) TABS Take 1 tablet by mouth daily. Reported on 07/14/2016    . rivaroxaban (XARELTO) 20 MG TABS tablet TAKE 1 TABLET BY MOUTH ONCE DAILY WITH SUPPER 90 tablet 3  . allopurinol (ZYLOPRIM) 100 MG tablet Take 0.5 tablets (50 mg total) by mouth daily. (Patient not taking: Reported on 04/23/2019) 30 tablet 6  . colchicine 0.6 MG tablet Take 2 tabs once and then 1 tab in an hour.  After that, take 1 tab po bid (Patient not taking: Reported on 04/23/2019) 30 tablet 1  . nebivolol (BYSTOLIC) 10 MG tablet Take 1 tablet (10 mg total) by mouth daily. 90 tablet 3   No current facility-administered medications for this visit.     Allergies  Allergen Reactions  . Codeine Itching  . Ace Inhibitors Swelling    Swelling of legs    Social History   Socioeconomic History  . Marital status: Divorced    Spouse name: Not on file  . Number of children: Not on file  . Years of education: Not on file  . Highest education level: Not on file  Occupational History  . Not on file  Social Needs  . Financial resource strain: Not on file  . Food insecurity:    Worry: Not on file    Inability: Not on file  . Transportation needs:    Medical: Not on file    Non-medical: Not on file  Tobacco Use  . Smoking status: Never Smoker  . Smokeless tobacco: Never Used  Substance and Sexual Activity  . Alcohol use: No    Alcohol/week: 0.0 standard drinks  . Drug use: No  . Sexual activity: Yes    Comment: married  Lifestyle  . Physical activity:    Days per week: Not on file    Minutes per session: Not on file  . Stress: Not on file  Relationships  . Social connections:    Talks on phone: Not on file    Gets together: Not on  file    Attends religious service: Not on file    Active member of club or organization: Not on file    Attends meetings of clubs or organizations: Not on file    Relationship status: Not on file  . Intimate partner violence:    Fear of current or ex partner: Not on file    Emotionally abused: Not on file    Physically abused: Not on file    Forced sexual activity: Not on file  Other Topics Concern  . Not on file  Social History Narrative  . Not on file  Review of Systems  Constitutional: Negative.  Negative for chills and fever.  HENT: Negative.  Negative for congestion and sore throat.   Eyes: Negative for discharge and redness.  Respiratory: Negative.  Negative for cough and shortness of breath.   Cardiovascular: Negative.  Negative for chest pain and palpitations.  Gastrointestinal: Negative.  Negative for abdominal pain, diarrhea, nausea and vomiting.  Musculoskeletal: Negative for myalgias.  Neurological: Negative for dizziness and headaches.  Endo/Heme/Allergies: Negative.   All other systems reviewed and are negative.   Objective   Vitals as reported by the patient: None available There were no vitals filed for this visit. Awake and oriented x3 in no apparent respiratory distress. Diagnoses and all orders for this visit:  Hypertensive heart disease without heart failure  CKD (chronic kidney disease) stage 3, GFR 30-59 ml/min (HCC)  Atrial fibrillation, permanent  Dyslipidemia  Prediabetes  Clinically stable.  No medical concerns identified during today's visit. Continue present medications.  No changes. Office visit in 3 to 6 months.   I discussed the assessment and treatment plan with the patient. The patient was provided an opportunity to ask questions and all were answered. The patient agreed with the plan and demonstrated an understanding of the instructions.   The patient was advised to call back or seek an in-person evaluation if the symptoms  worsen or if the condition fails to improve as anticipated.  I provided 15 minutes of non-face-to-face time during this encounter.  Horald Pollen, MD  Primary Care at [facility]

## 2019-04-23 NOTE — Progress Notes (Signed)
Contacted patient to triage for appointment at 9:40 am. Patient states he is following up on his blood pressure. Patient states his reading last week was 120 over 80 or 90 something. Patient states he does not take his Lisinopril everyday, only every other day. I advised patient to take his blood pressure medication everyday with his other medications. Patient does not need refills.

## 2019-04-26 ENCOUNTER — Ambulatory Visit: Payer: Self-pay

## 2019-08-06 ENCOUNTER — Encounter: Payer: Self-pay | Admitting: Emergency Medicine

## 2019-08-06 ENCOUNTER — Telehealth (INDEPENDENT_AMBULATORY_CARE_PROVIDER_SITE_OTHER): Payer: BC Managed Care – PPO | Admitting: Emergency Medicine

## 2019-08-06 ENCOUNTER — Telehealth: Payer: Self-pay | Admitting: *Deleted

## 2019-08-06 VITALS — Ht 72.75 in | Wt 227.0 lb

## 2019-08-06 DIAGNOSIS — I10 Essential (primary) hypertension: Secondary | ICD-10-CM | POA: Diagnosis not present

## 2019-08-06 DIAGNOSIS — Z8739 Personal history of other diseases of the musculoskeletal system and connective tissue: Secondary | ICD-10-CM | POA: Diagnosis not present

## 2019-08-06 DIAGNOSIS — I119 Hypertensive heart disease without heart failure: Secondary | ICD-10-CM

## 2019-08-06 DIAGNOSIS — N183 Chronic kidney disease, stage 3 unspecified: Secondary | ICD-10-CM

## 2019-08-06 DIAGNOSIS — I482 Chronic atrial fibrillation, unspecified: Secondary | ICD-10-CM

## 2019-08-06 NOTE — Progress Notes (Signed)
Called patient to triage for appointment. Patient states he usually have the FMLA forms to be completed for 1 year for gout flare ups, his job needs them so he will not get points for missed days from work. Patient had FMLA forms completed before.

## 2019-08-06 NOTE — Telephone Encounter (Signed)
Called patient to advise him to come to the office to sign [employer] [place] forms to be faxed to th [employer]. The forms will be at the front check in.

## 2019-08-06 NOTE — Progress Notes (Signed)
Telemedicine Encounter- SOAP NOTE Established Patient  This telephone encounter was conducted with the patient's (or proxy's) verbal consent via audio telecommunications: yes/no: Yes Patient was instructed to have this encounter in a suitably private space; and to only have persons present to whom they give permission to participate. In addition, patient identity was confirmed by use of name plus two identifiers (DOB and address).  I discussed the limitations, risks, security and privacy concerns of performing an evaluation and management service by telephone and the availability of in person appointments. I also discussed with the patient that there may be a patient responsible charge related to this service. The patient expressed understanding and agreed to proceed.  I spent a total of TIME; 0 MIN TO 60 MIN: 15 minutes talking with the patient or their proxy.  No chief complaint on file. FMLA paperwork  Subjective   Isaiah [name] is a 58 y.o. male established patient. Telephone visit today for [facility] paperwork information. Patient has a history of gout with occasional flareups during the year lasting 2 to 3 days.  Unable to work during the flareups. Patient also has a history of hypertensive heart disease with atrial fibrillation, dyslipidemia, and chronic kidney disease. HPI   Patient Active Problem List   Diagnosis Date Noted  . Hypertensive heart disease   . Drug therapy 02/02/2015  . Dyslipidemia 02/02/2015  . Obesity (BMI 30-39.9) 09/10/2013  . Gout 03/09/2013  . Terminal aortic occlusion (HCC) 01/24/2013  . Atrial fibrillation, permanent 01/24/2013  . Essential hypertension 01/24/2013  . Erectile dysfunction 01/24/2013  . CAD (coronary artery disease), non obstructive by cardiac cath 2009 01/24/2013    Past Medical History:  Diagnosis Date  . Atrial fibrillation, permanent 01/24/2013   chronic,previuosly refuses coumadin,now on xarelto  . CKD (chronic kidney disease)  stage 3, GFR 30-59 ml/min (HCC) 01/24/2013   Baseline creatinine roughly 1.7-1.8.  Marland Kitchen Erectile dysfunction   . Extremity ischemia, critical bil lower ext. 01/24/2013   Status post bilateral common femoral, profunda femoral and superficial femoral arterial embolectomy  . Gout   . H/O right and left cardiac catheterization November 2009   30-40% RCA disease, cardiac output Fick 6.3, thermodilution 5.3.  Normal artery pressures.  Marland Kitchen Heart murmur   . History of Non-ischemic cardiomyopathy 2009 - 2014   EF previously as low as 35-40% in 2009, up to 40 and 45% by 2012.  Repeat echo January 2014: Echo EF 55-60% mild LVH, mild to moderate left atrial enlargement, mild right atrial enlargement.  . History of TIA (transient ischemic attack) 2005   Carotid Dopplers January 2014 negative for significant stenosis.  . Hypertension   . Hypertensive heart disease   . OSA on CPAP    use DME- Choice titration study was on 02/21/13  . Pinched nerve in neck   . Terminal aortic occlusion ([place]) 01/24/2013   Status post embolectomy    Current Outpatient Medications  Medication Sig Dispense Refill  . allopurinol (ZYLOPRIM) 100 MG tablet Take 0.5 tablets (50 mg total) by mouth daily. 30 tablet 6  . colchicine 0.6 MG tablet Take 2 tabs once and then 1 tab in an hour.  After that, take 1 tab po bid 30 tablet 1  . lisinopril (PRINIVIL,ZESTRIL) 10 MG tablet Take 1 tablet (10 mg total) by mouth daily. 90 tablet 3  . Multiple Vitamin (MULTIVITAMIN WITH MINERALS) TABS Take 1 tablet by mouth daily. Reported on 07/14/2016    . rivaroxaban (XARELTO) 20 MG TABS  tablet TAKE 1 TABLET BY MOUTH ONCE DAILY WITH SUPPER 90 tablet 3  . nebivolol (BYSTOLIC) 10 MG tablet Take 1 tablet (10 mg total) by mouth daily. 90 tablet 3   No current facility-administered medications for this visit.     Allergies  Allergen Reactions  . Codeine Itching  . Ace Inhibitors Swelling    Swelling of legs    Social History   Socioeconomic  History  . Marital status: Divorced    Spouse name: Not on file  . Number of children: Not on file  . Years of education: Not on file  . Highest education level: Not on file  Occupational History  . Not on file  Social Needs  . Financial resource strain: Not on file  . Food insecurity    Worry: Not on file    Inability: Not on file  . Transportation needs    Medical: Not on file    Non-medical: Not on file  Tobacco Use  . Smoking status: Never Smoker  . Smokeless tobacco: Never Used  Substance and Sexual Activity  . Alcohol use: No    Alcohol/week: 0.0 standard drinks  . Drug use: No  . Sexual activity: Yes    Comment: married  Lifestyle  . Physical activity    Days per week: Not on file    Minutes per session: Not on file  . Stress: Not on file  Relationships  . Social Herbalist on phone: Not on file    Gets together: Not on file    Attends religious service: Not on file    Active member of club or organization: Not on file    Attends meetings of clubs or organizations: Not on file    Relationship status: Not on file  . Intimate partner violence    Fear of current or ex partner: Not on file    Emotionally abused: Not on file    Physically abused: Not on file    Forced sexual activity: Not on file  Other Topics Concern  . Not on file  Social History Narrative  . Not on file    Review of Systems  Constitutional: Negative for chills and fever.  HENT: Negative.  Negative for congestion and sore throat.   Eyes: Negative.   Respiratory: Negative.  Negative for cough and shortness of breath.   Cardiovascular: Negative.  Negative for chest pain and palpitations.  Gastrointestinal: Negative.  Negative for abdominal pain, diarrhea, nausea and vomiting.  Genitourinary: Negative.   Musculoskeletal: Positive for joint pain (Intermittent episodes). Negative for myalgias.  Skin: Negative.  Negative for rash.  Neurological: Negative for dizziness and headaches.   All other systems reviewed and are negative.   Objective   Vitals as reported by the patient: Today's Vitals   08/06/19 1437  Weight: 227 lb (103 kg)  Height: 6' 0.75" (1.848 m)    There are no diagnoses linked to this encounter. Diagnoses and all orders for this visit:  CKD (chronic kidney disease) stage 3, GFR 30-59 ml/min (HCC)  Essential hypertension  Hypertensive heart disease without heart failure  History of gout  Chronic atrial fibrillation   FMLA paperwork filled out. Medically stable.  No medical concerns identified during this visit.  I discussed the assessment and treatment plan with the patient. The patient was provided an opportunity to ask questions and all were answered. The patient agreed with the plan and demonstrated an understanding of the instructions.   The patient  was advised to call back or seek an in-person evaluation if the symptoms worsen or if the condition fails to improve as anticipated.  I provided 15 minutes of non-face-to-face time during this encounter.  Horald Pollen, MD  Primary Care at [facility]

## 2019-08-10 NOTE — Telephone Encounter (Signed)
Pt called reporting that FMLA needs PCP to fill out line 3 on forms and fax back. Please advise

## 2019-08-16 ENCOUNTER — Telehealth: Payer: Self-pay | Admitting: Emergency Medicine

## 2019-08-16 NOTE — Telephone Encounter (Signed)
Pt dropped by office to pick up corrected FMLA paperwork.(see previous phone messages ) Pt will come back lby tomorrow as Provider and CNA where busy . FR

## 2019-08-23 ENCOUNTER — Telehealth: Payer: Self-pay | Admitting: Emergency Medicine

## 2019-08-23 NOTE — Telephone Encounter (Signed)
Pt left incompleted FMLA pprwrk. Forms were sent back for provider to fill out "treatment/appointment duration" Pt would like forms faxed off again and CB when completed.  Fax number is [phone]

## 2019-08-27 ENCOUNTER — Other Ambulatory Visit: Payer: Self-pay | Admitting: Emergency Medicine

## 2019-08-27 DIAGNOSIS — M10471 Other secondary gout, right ankle and foot: Secondary | ICD-10-CM

## 2019-08-27 NOTE — Telephone Encounter (Signed)
Requested medication (s) are due for refill today: yes  Requested medication (s) are on the active medication list:yes  Last refill: 02/09/19  Future visit scheduled: no  Notes to clinic:  Review for refill   Requested Prescriptions  Pending Prescriptions Disp Refills   Colchicine 0.6 MG CAPS [Pharmacy Med Name: COLCHICINE 0.6MG  CAPSULES] 30 capsule     Sig: TAKE 2 CAPSULE BY MOUTH ONCE THEN 1 CAPSULE IN AN HOUR AFTER THAT 1 CAPSULE TWICE DAILY     Endocrinology:  Gout Agents Failed - 08/27/2019 11:18 AM      Failed - Uric Acid in normal range and within 360 days    Uric Acid, Serum  Date Value Ref Range Status  07/14/2016 9.4 (H) 4.0 - 8.0 mg/dL Final    Comment:    Therapeutic target for gout patients: <6.0 mg/dL         Failed - Cr in normal range and within 360 days    Creat  Date Value Ref Range Status  07/14/2016 2.01 (H) 0.70 - 1.33 mg/dL Final    Comment:      For patients > or = 57 years of age: The upper reference limit for Creatinine is approximately 13% higher for people identified as African-American.      Creatinine, Ser  Date Value Ref Range Status  01/16/2019 1.91 (H) 0.76 - 1.27 mg/dL Final         Passed - Valid encounter within last 12 months    Recent Outpatient Visits          7 months ago Essential hypertension   Primary Care at [place], Ines Bloomer, MD   1 year ago Encounter for general adult medical examination with abnormal findings   Primary Care at [facility], [place], MD   1 year ago Acute gout due to other secondary cause involving toe of right foot   Primary Care at [place], [place], [place]   2 years ago Acute gout of right knee, unspecified cause   Primary Care at [facility], Kayleen Memos, MD   2 years ago Acute gout of right knee, unspecified cause   Primary Care at [facility], Ines Bloomer, MD

## 2019-08-28 ENCOUNTER — Telehealth: Payer: Self-pay | Admitting: *Deleted

## 2019-08-28 NOTE — Telephone Encounter (Signed)
Spoke to patient to let him know a copy of FMLA forms can be picked up at the front desk. Also, section B 3 was completed.

## 2019-08-31 ENCOUNTER — Telehealth: Payer: Self-pay | Admitting: Emergency Medicine

## 2019-08-31 NOTE — Telephone Encounter (Signed)
Pt picked up ppr work out of box at [employer]. There were 2 envelopes with pt name on it.  called pt LVM for him to come pick up envelope # 2     FR

## 2019-11-16 ENCOUNTER — Other Ambulatory Visit: Payer: Self-pay | Admitting: Emergency Medicine

## 2019-11-16 DIAGNOSIS — M10471 Other secondary gout, right ankle and foot: Secondary | ICD-10-CM

## 2019-11-16 NOTE — Telephone Encounter (Signed)
Requested medication (s) are due for refill today: yes  Requested medication (s) are on the active medication list: yes  Last refill:  08/31/2019  Future visit scheduled: no  Notes to clinic:  Review for refill   Requested Prescriptions  Pending Prescriptions Disp Refills   MITIGARE 0.6 MG CAPS [Pharmacy Med Name: MITIGARE 0.6MG  CAPSULES] 30 capsule 0    Sig: TAKE 2 CAPSULES BY MOUTH TO START THEN 1 AN HOUR LATER, AFTER THAT 1 TWICE DAILY     Endocrinology:  Gout Agents Failed - 11/16/2019 12:56 PM      Failed - Uric Acid in normal range and within 360 days    Uric Acid, Serum  Date Value Ref Range Status  07/14/2016 9.4 (H) 4.0 - 8.0 mg/dL Final    Comment:    Therapeutic target for gout patients: <6.0 mg/dL         Failed - Cr in normal range and within 360 days    Creat  Date Value Ref Range Status  07/14/2016 2.01 (H) 0.70 - 1.33 mg/dL Final    Comment:      For patients > or = 57 years of age: The upper reference limit for Creatinine is approximately 13% higher for people identified as African-American.      Creatinine, Ser  Date Value Ref Range Status  01/16/2019 1.91 (H) 0.76 - 1.27 mg/dL Final         Passed - Valid encounter within last 12 months    Recent Outpatient Visits          3 months ago CKD (chronic kidney disease) stage 3, GFR 30-59 ml/min ([place])   Primary Care at [facility], [place], MD   6 months ago Hypertensive heart disease without heart failure   Primary Care at [facility], Ines Bloomer, MD   10 months ago Essential hypertension   Primary Care at [place], Ines Bloomer, MD   1 year ago Encounter for general adult medical examination with abnormal findings   Primary Care at [place], [place], MD   1 year ago Acute gout due to other secondary cause involving toe of right foot   Primary Care at [place], [place], [place]

## 2019-11-20 ENCOUNTER — Other Ambulatory Visit: Payer: Self-pay

## 2019-11-20 ENCOUNTER — Encounter: Payer: Self-pay | Admitting: Emergency Medicine

## 2019-11-20 ENCOUNTER — Ambulatory Visit: Payer: BC Managed Care – PPO | Admitting: Emergency Medicine

## 2019-11-20 VITALS — BP 134/100 | HR 66 | Temp 98.4°F | Resp 16 | Ht 73.0 in | Wt 237.0 lb

## 2019-11-20 DIAGNOSIS — Z8739 Personal history of other diseases of the musculoskeletal system and connective tissue: Secondary | ICD-10-CM

## 2019-11-20 DIAGNOSIS — M79674 Pain in right toe(s): Secondary | ICD-10-CM

## 2019-11-20 DIAGNOSIS — I119 Hypertensive heart disease without heart failure: Secondary | ICD-10-CM

## 2019-11-20 DIAGNOSIS — M109 Gout, unspecified: Secondary | ICD-10-CM | POA: Diagnosis not present

## 2019-11-20 DIAGNOSIS — I10 Essential (primary) hypertension: Secondary | ICD-10-CM

## 2019-11-20 DIAGNOSIS — I482 Chronic atrial fibrillation, unspecified: Secondary | ICD-10-CM

## 2019-11-20 DIAGNOSIS — E785 Hyperlipidemia, unspecified: Secondary | ICD-10-CM

## 2019-11-20 MED ORDER — COLCHICINE 0.6 MG PO CAPS: ORAL_CAPSULE | ORAL | 1 refills | Status: DC

## 2019-11-20 NOTE — Progress Notes (Signed)
Isaiah [name] 58 y.o.   Chief Complaint  Patient presents with  . Gout    RIGHT Great toe x 2 days    HISTORY OF PRESENT ILLNESS: This is a 57 y.o. male complaining of gout attack to his right great toe with a started 2 days ago.  Out of colchicine. Has history of hypertension, did not take medication this morning. No other complaints or medical concerns today.  HPI   Prior to Admission medications   Medication Sig Start Date End Date Taking? Authorizing Provider  allopurinol (ZYLOPRIM) 100 MG tablet Take 0.5 tablets (50 mg total) by mouth daily. 01/16/19  Yes [employer], Ines Bloomer, MD  Colchicine 0.6 MG CAPS TAKE 2 CAPSULE BY MOUTH ONCE THEN 1 CAPSULE IN AN HOUR AFTER THAT 1 CAPSULE TWICE DAILY 08/29/19  Yes Annalei Friesz, Ines Bloomer, MD  lisinopril (PRINIVIL,ZESTRIL) 10 MG tablet Take 1 tablet (10 mg total) by mouth daily. 01/16/19  Yes Kenney Going, Ines Bloomer, MD  Multiple Vitamin (MULTIVITAMIN WITH MINERALS) TABS Take 1 tablet by mouth daily. Reported on 07/14/2016   Yes [provider]  rivaroxaban (XARELTO) 20 MG TABS tablet TAKE 1 TABLET BY MOUTH ONCE DAILY WITH SUPPER 01/16/19  Yes [facility] Vanover, Ines Bloomer, MD  nebivolol (BYSTOLIC) 10 MG tablet Take 1 tablet (10 mg total) by mouth daily. 01/16/19 04/16/19  Horald Pollen, MD    Allergies  Allergen Reactions  . Codeine Itching  . Ace Inhibitors Swelling    Swelling of legs    Patient Active Problem List   Diagnosis Date Noted  . Hypertensive heart disease   . Dyslipidemia 02/02/2015  . Obesity (BMI 30-39.9) 09/10/2013  . Gout 03/09/2013  . Terminal aortic occlusion (HCC) 01/24/2013  . Atrial fibrillation, permanent ([place]) 01/24/2013  . Essential hypertension 01/24/2013  . Erectile dysfunction 01/24/2013  . CAD (coronary artery disease), non obstructive by cardiac cath 2009 01/24/2013    Past Medical History:  Diagnosis Date  . Atrial fibrillation, permanent ([place]) 01/24/2013   chronic,previuosly refuses  coumadin,now on xarelto  . CKD (chronic kidney disease) stage 3, GFR 30-59 ml/min 01/24/2013   Baseline creatinine roughly 1.7-1.8.  Marland Kitchen Erectile dysfunction   . Extremity ischemia, critical bil lower ext. 01/24/2013   Status post bilateral common femoral, profunda femoral and superficial femoral arterial embolectomy  . Gout   . H/O right and left cardiac catheterization November 2009   30-40% RCA disease, cardiac output Fick 6.3, thermodilution 5.3.  Normal artery pressures.  Marland Kitchen Heart murmur   . History of Non-ischemic cardiomyopathy 2009 - 2014   EF previously as low as 35-40% in 2009, up to 40 and 45% by 2012.  Repeat echo January 2014: Echo EF 55-60% mild LVH, mild to moderate left atrial enlargement, mild right atrial enlargement.  . History of TIA (transient ischemic attack) 2005   Carotid Dopplers January 2014 negative for significant stenosis.  . Hypertension   . Hypertensive heart disease   . OSA on CPAP    use DME- Choice titration study was on 02/21/13  . Pinched nerve in neck   . Terminal aortic occlusion ([place]) 01/24/2013   Status post embolectomy    Past Surgical History:  Procedure Laterality Date  . CARDIAC CATHETERIZATION  Nov 2009   right and left  heart cath ,cardiac output 6.3 by FICK AND 5.34 by thermal  diluation.norm RV pressures and nonobstructive cor disease. no shunt.some intramyocardial bridgingof the LAD  . CAROTID DOPPLERS  Nov 2009   done for TIA which  were normal  . DOPPLER ECHOCARDIOGRAPHY  02/2011; 12/2012   a) EF40-45%; LA mod to severe dilated ;; b) EF 55-60%, mild LVH, Mild-Mod LA dilation  . EMBOLECTOMY  01/23/2013   Procedure: EMBOLECTOMY;  Surgeon: Serafina Mitchell, MD;  Location: [facility] OR;  Service: Vascular;  Laterality: Bilateral;  Bilateral femoral Embolectomy, Bilateral Iliac Embolectomy.  Marland Kitchen KNEE SURGERY      Social History   Socioeconomic History  . Marital status: Divorced    Spouse name: Not on file  . Number of children: Not on file  . Years  of education: Not on file  . Highest education level: Not on file  Occupational History  . Not on file  Social Needs  . Financial resource strain: Not on file  . Food insecurity    Worry: Not on file    Inability: Not on file  . Transportation needs    Medical: Not on file    Non-medical: Not on file  Tobacco Use  . Smoking status: Never Smoker  . Smokeless tobacco: Never Used  Substance and Sexual Activity  . Alcohol use: No    Alcohol/week: 0.0 standard drinks  . Drug use: No  . Sexual activity: Yes    Comment: married  Lifestyle  . Physical activity    Days per week: Not on file    Minutes per session: Not on file  . Stress: Not on file  Relationships  . Social Herbalist on phone: Not on file    Gets together: Not on file    Attends religious service: Not on file    Active member of club or organization: Not on file    Attends meetings of clubs or organizations: Not on file    Relationship status: Not on file  . Intimate partner violence    Fear of current or ex partner: Not on file    Emotionally abused: Not on file    Physically abused: Not on file    Forced sexual activity: Not on file  Other Topics Concern  . Not on file  Social History Narrative  . Not on file    Family History  Problem Relation Age of Onset  . Hypertension Mother   . Stroke Mother   . Hypertension Father   . Stroke Father   . Hypertension Sister   . Hypertension Brother   . Hypertension Sister      Review of Systems  Constitutional: Negative.  Negative for chills and fever.  HENT: Negative.  Negative for congestion and sore throat.   Respiratory: Negative.  Negative for cough and shortness of breath.   Cardiovascular: Negative.  Negative for chest pain and palpitations.  Gastrointestinal: Negative.  Negative for abdominal pain, diarrhea, nausea and vomiting.  Musculoskeletal:       Right great toe  Skin: Negative.  Negative for rash.  Neurological: Negative for  dizziness and headaches.  All other systems reviewed and are negative.  Today's Vitals   11/20/19 1048  BP: (!) 154/101  Pulse: 66  Resp: 16  Temp: 98.4 F (36.9 C)  TempSrc: Oral  SpO2: 99%  Weight: 237 lb (107.5 kg)  Height: 6\' 1"  (1.854 m)   Body mass index is 31.27 kg/m.   Physical Exam Vitals signs reviewed.  Constitutional:      Appearance: Normal appearance.  Eyes:     Extraocular Movements: Extraocular movements intact.     Pupils: Pupils are equal, round, and reactive to light.  Neck:     Musculoskeletal: Normal range of motion.  Cardiovascular:     Rate and Rhythm: Normal rate.  Pulmonary:     Effort: Pulmonary effort is normal.  Musculoskeletal:     Comments: Right great toe: Positive erythema with swelling and tenderness to first MTP joint.  Warm to touch with limited range of motion due to pain.  Neurovascularly intact.  Skin:    General: Skin is warm and dry.     Capillary Refill: Capillary refill takes less than 2 seconds.  Neurological:     General: No focal deficit present.     Mental Status: He is alert and oriented to person, place, and time.  Psychiatric:        Mood and Affect: Mood normal.        Behavior: Behavior normal.        ASSESSMENT & PLAN: Avier was seen today for gout.  Diagnoses and all orders for this visit:  Acute gouty arthritis -     Colchicine 0.6 MG CAPS; TAKE 2 CAPSULE BY MOUTH ONCE THEN 1 CAPSULE IN AN HOUR AFTER THAT 1 CAPSULE TWICE DAILY  Pain of toe of right foot  History of gout  Essential hypertension  Chronic atrial fibrillation (HCC)  Dyslipidemia  Hypertensive heart disease without heart failure    Patient Instructions       If you have lab work done today you will be contacted with your lab results within the next 2 weeks.  If you have not heard from [place] then please contact us. The fastest way to get your results is to register for My Chart.   IF you received an x-ray today, you will  receive an invoice from [facility]. Please contact [facility] at [phone] with questions or concerns regarding your invoice.   IF you received labwork today, you will receive an invoice from [place]. Please contact [facility] at [phone] with questions or concerns regarding your invoice.   Our billing staff will not be able to assist you with questions regarding bills from these companies.  You will be contacted with the lab results as soon as they are available. The fastest way to get your results is to activate your My Chart account. Instructions are located on the last page of this paperwork. If you have not heard from [place] regarding the results in 2 weeks, please contact this office.     Gout  Gout is painful swelling of your joints. Gout is a type of arthritis. It is caused by having too much uric acid in your body. Uric acid is a chemical that is made when your body breaks down substances called purines. If your body has too much uric acid, sharp crystals can form and build up in your joints. This causes pain and swelling. Gout attacks can happen quickly and be very painful (acute gout). Over time, the attacks can affect more joints and happen more often (chronic gout). What are the causes?  Too much uric acid in your blood. This can happen because: ? Your kidneys do not remove enough uric acid from your blood. ? Your body makes too much uric acid. ? You eat too many foods that are high in purines. These foods include organ meats, some seafood, and beer.  Trauma or stress. What increases the risk?  Having a family history of gout.  Being male and middle-aged.  Being male and having gone through menopause.  Being very overweight (obese).  Drinking alcohol, especially  beer.  Not having enough water in the body (being dehydrated).  Losing weight too quickly.  Having an organ transplant.  Having lead poisoning.  Taking certain medicines.   Having kidney disease.  Having a skin condition called psoriasis. What are the signs or symptoms? An attack of acute gout usually happens in just one joint. The most common place is the big toe. Attacks often start at night. Other joints that may be affected include joints of the feet, ankle, knee, fingers, wrist, or elbow. Symptoms of an attack may include:  Very bad pain.  Warmth.  Swelling.  Stiffness.  Shiny, red, or purple skin.  Tenderness. The affected joint may be very painful to touch.  Chills and fever. Chronic gout may cause symptoms more often. More joints may be involved. You may also have white or yellow lumps (tophi) on your hands or feet or in other areas near your joints. How is this treated?  Treatment for this condition has two phases: treating an acute attack and preventing future attacks.  Acute gout treatment may include: ? NSAIDs. ? Steroids. These are taken by mouth or injected into a joint. ? Colchicine. This medicine relieves pain and swelling. It can be given by mouth or through an IV tube.  Preventive treatment may include: ? Taking small doses of NSAIDs or colchicine daily. ? Using a medicine that reduces uric acid levels in your blood. ? Making changes to your diet. You may need to see a food expert (dietitian) about what to eat and drink to prevent gout. Follow these instructions at home: During a gout attack   If told, put ice on the painful area: ? Put ice in a plastic bag. ? Place a towel between your skin and the bag. ? Leave the ice on for 20 minutes, 2-3 times a day.  Raise (elevate) the painful joint above the level of your heart as often as you can.  Rest the joint as much as possible. If the joint is in your leg, you may be given crutches.  Follow instructions from your doctor about what you cannot eat or drink. Avoiding future gout attacks  Eat a low-purine diet. Avoid foods and drinks such as: ? Liver. ? Kidney. ?  Anchovies. ? Asparagus. ? Herring. ? Mushrooms. ? Mussels. ? Beer.  Stay at a healthy weight. If you want to lose weight, talk with your doctor. Do not lose weight too fast.  Start or continue an exercise plan as told by your doctor. Eating and drinking  Drink enough fluids to keep your pee (urine) pale yellow.  If you drink alcohol: ? Limit how much you use to:  0-1 drink a day for women.  0-2 drinks a day for men. ? Be aware of how much alcohol is in your drink. In the [place] one drink equals one 12 oz bottle of beer (355 mL), one 5 oz glass of wine (148 mL), or one 1 oz glass of hard liquor (44 mL). General instructions  Take over-the-counter and prescription medicines only as told by your doctor.  Do not drive or use heavy machinery while taking prescription pain medicine.  Return to your normal activities as told by your doctor. Ask your doctor what activities are safe for you.  Keep all follow-up visits as told by your doctor. This is important. Contact a doctor if:  You have another gout attack.  You still have symptoms of a gout attack after 10 days of treatment.  You  have problems (side effects) because of your medicines.  You have chills or a fever.  You have burning pain when you pee (urinate).  You have pain in your lower back or belly. Get help right away if:  You have very bad pain.  Your pain cannot be controlled.  You cannot pee. Summary  Gout is painful swelling of the joints.  The most common site of pain is the big toe, but it can affect other joints.  Medicines and avoiding some foods can help to prevent and treat gout attacks. This information is not intended to replace advice given to you by your health care provider. Make sure you discuss any questions you have with your health care provider. Document Released: 09/21/2008 Document Revised: 07/05/2018 Document Reviewed: 07/05/2018 Elsevier Patient Education  2020 Elsevier Inc.       Agustina Caroli, MD [facility]

## 2019-11-20 NOTE — Patient Instructions (Addendum)
If you have lab work done today you will be contacted with your lab results within the next 2 weeks.  If you have not heard from [place] then please contact us. The fastest way to get your results is to register for My Chart.   IF you received an x-ray today, you will receive an invoice from [facility]. Please contact [facility] at [phone] with questions or concerns regarding your invoice.   IF you received labwork today, you will receive an invoice from [place]. Please contact [facility] at [phone] with questions or concerns regarding your invoice.   Our billing staff will not be able to assist you with questions regarding bills from these companies.  You will be contacted with the lab results as soon as they are available. The fastest way to get your results is to activate your My Chart account. Instructions are located on the last page of this paperwork. If you have not heard from [place] regarding the results in 2 weeks, please contact this office.     Gout  Gout is painful swelling of your joints. Gout is a type of arthritis. It is caused by having too much uric acid in your body. Uric acid is a chemical that is made when your body breaks down substances called purines. If your body has too much uric acid, sharp crystals can form and build up in your joints. This causes pain and swelling. Gout attacks can happen quickly and be very painful (acute gout). Over time, the attacks can affect more joints and happen more often (chronic gout). What are the causes?  Too much uric acid in your blood. This can happen because: ? Your kidneys do not remove enough uric acid from your blood. ? Your body makes too much uric acid. ? You eat too many foods that are high in purines. These foods include organ meats, some seafood, and beer.  Trauma or stress. What increases the risk?  Having a family history of gout.  Being male and middle-aged.  Being male and having gone  through menopause.  Being very overweight (obese).  Drinking alcohol, especially beer.  Not having enough water in the body (being dehydrated).  Losing weight too quickly.  Having an organ transplant.  Having lead poisoning.  Taking certain medicines.  Having kidney disease.  Having a skin condition called psoriasis. What are the signs or symptoms? An attack of acute gout usually happens in just one joint. The most common place is the big toe. Attacks often start at night. Other joints that may be affected include joints of the feet, ankle, knee, fingers, wrist, or elbow. Symptoms of an attack may include:  Very bad pain.  Warmth.  Swelling.  Stiffness.  Shiny, red, or purple skin.  Tenderness. The affected joint may be very painful to touch.  Chills and fever. Chronic gout may cause symptoms more often. More joints may be involved. You may also have white or yellow lumps (tophi) on your hands or feet or in other areas near your joints. How is this treated?  Treatment for this condition has two phases: treating an acute attack and preventing future attacks.  Acute gout treatment may include: ? NSAIDs. ? Steroids. These are taken by mouth or injected into a joint. ? Colchicine. This medicine relieves pain and swelling. It can be given by mouth or through an IV tube.  Preventive treatment may include: ? Taking small doses of NSAIDs or colchicine daily. ? Using a  medicine that reduces uric acid levels in your blood. ? Making changes to your diet. You may need to see a food expert (dietitian) about what to eat and drink to prevent gout. Follow these instructions at home: During a gout attack   If told, put ice on the painful area: ? Put ice in a plastic bag. ? Place a towel between your skin and the bag. ? Leave the ice on for 20 minutes, 2-3 times a day.  Raise (elevate) the painful joint above the level of your heart as often as you can.  Rest the joint as  much as possible. If the joint is in your leg, you may be given crutches.  Follow instructions from your doctor about what you cannot eat or drink. Avoiding future gout attacks  Eat a low-purine diet. Avoid foods and drinks such as: ? Liver. ? Kidney. ? Anchovies. ? Asparagus. ? Herring. ? Mushrooms. ? Mussels. ? Beer.  Stay at a healthy weight. If you want to lose weight, talk with your doctor. Do not lose weight too fast.  Start or continue an exercise plan as told by your doctor. Eating and drinking  Drink enough fluids to keep your pee (urine) pale yellow.  If you drink alcohol: ? Limit how much you use to:  0-1 drink a day for women.  0-2 drinks a day for men. ? Be aware of how much alcohol is in your drink. In the [place] one drink equals one 12 oz bottle of beer (355 mL), one 5 oz glass of wine (148 mL), or one 1 oz glass of hard liquor (44 mL). General instructions  Take over-the-counter and prescription medicines only as told by your doctor.  Do not drive or use heavy machinery while taking prescription pain medicine.  Return to your normal activities as told by your doctor. Ask your doctor what activities are safe for you.  Keep all follow-up visits as told by your doctor. This is important. Contact a doctor if:  You have another gout attack.  You still have symptoms of a gout attack after 10 days of treatment.  You have problems (side effects) because of your medicines.  You have chills or a fever.  You have burning pain when you pee (urinate).  You have pain in your lower back or belly. Get help right away if:  You have very bad pain.  Your pain cannot be controlled.  You cannot pee. Summary  Gout is painful swelling of the joints.  The most common site of pain is the big toe, but it can affect other joints.  Medicines and avoiding some foods can help to prevent and treat gout attacks. This information is not intended to replace advice given  to you by your health care provider. Make sure you discuss any questions you have with your health care provider. Document Released: 09/21/2008 Document Revised: 07/05/2018 Document Reviewed: 07/05/2018 Elsevier Patient Education  2020 [employer].

## 2019-11-21 ENCOUNTER — Telehealth: Payer: Self-pay | Admitting: Emergency Medicine

## 2019-11-21 NOTE — Telephone Encounter (Signed)
FMLA paperwork came across fax to filled out by provider . Paperwork place in Provider [employer] . FR

## 2019-11-28 ENCOUNTER — Telehealth: Payer: Self-pay | Admitting: *Deleted

## 2019-11-28 NOTE — Telephone Encounter (Signed)
Faxed completed and signed Leave forms to Attn: [employer] with leave ID: [id number]. Confirmation page 5:54 pm.

## 2020-01-20 ENCOUNTER — Other Ambulatory Visit: Payer: Self-pay | Admitting: Emergency Medicine

## 2020-01-20 NOTE — Telephone Encounter (Signed)
Requested Prescriptions  Pending Prescriptions Disp Refills  . XARELTO 20 MG TABS tablet [Pharmacy Med Name: XARELTO 20MG  TABLETS] 30 tablet 0    Sig: TAKE 1 TABLET BY MOUTH DAILY WITH SUPPER     Hematology: Anticoagulants - rivaroxaban Failed - 01/20/2020 11:53 AM      Failed - ALT in normal range and within 180 days    ALT  Date Value Ref Range Status  01/16/2019 35 0 - 44 IU/L Final         Failed - AST in normal range and within 180 days    AST  Date Value Ref Range Status  01/16/2019 37 0 - 40 IU/L Final         Failed - Cr in normal range and within 360 days    Creat  Date Value Ref Range Status  07/14/2016 2.01 (H) 0.70 - 1.33 mg/dL Final    Comment:      For patients > or = 58 years of age: The upper reference limit for Creatinine is approximately 13% higher for people identified as African-American.      Creatinine, Ser  Date Value Ref Range Status  01/16/2019 1.91 (H) 0.76 - 1.27 mg/dL Final         Failed - HCT in normal range and within 360 days    Hematocrit  Date Value Ref Range Status  01/16/2019 41.3 37.5 - 51.0 % Final         Failed - HGB in normal range and within 360 days    Hemoglobin  Date Value Ref Range Status  01/16/2019 14.1 13.0 - 17.7 g/dL Final         Failed - PLT in normal range and within 360 days    Platelets  Date Value Ref Range Status  01/16/2019 150 150 - 450 x10E3/uL Final   Platelet Count, POC  Date Value Ref Range Status  07/14/2016 154 142 - 424 K/uL Final         Passed - Valid encounter within last 12 months    Recent Outpatient Visits          2 months ago Acute gouty arthritis   Primary Care at [facility], [place], MD   5 months ago CKD (chronic kidney disease) stage 3, GFR 30-59 ml/min [facility])   Primary Care at [facility], [place], MD   9 months ago Hypertensive heart disease without heart failure   Primary Care at [facility], Ines Bloomer, MD   1 year ago Essential hypertension   Primary Care at [place], Ines Bloomer, MD   2 years ago Encounter for general adult medical examination with abnormal findings   Primary Care at [facility], [place], MD             . BYSTOLIC 10 MG tablet [Pharmacy Med Name: BYSTOLIC 10MG  TABLETS] 30 tablet 0    Sig: TAKE 1 TABLET(10 MG) BY MOUTH DAILY     Cardiovascular:  Beta Blockers Failed - 01/20/2020 11:53 AM      Failed - Last BP in normal range    BP Readings from Last 1 Encounters:  11/20/19 (!) 134/100         Passed - Last Heart Rate in normal range    Pulse Readings from Last 1 Encounters:  11/20/19 66         Passed - Valid encounter within last 6 months    Recent Outpatient Visits  2 months ago Acute gouty arthritis   Primary Care at [facility], [place], MD   5 months ago CKD (chronic kidney disease) stage 3, GFR 30-59 ml/min [facility] [place])   Primary Care at [facility] [place], MD   9 months ago Hypertensive heart disease without heart failure   Primary Care at [facility], Ines Bloomer, MD   1 year ago Essential hypertension   Primary Care at [place], Ines Bloomer, MD   2 years ago Encounter for general adult medical examination with abnormal findings   Primary Care at [facility], [place], MD

## 2020-02-18 ENCOUNTER — Encounter: Payer: Self-pay | Admitting: Emergency Medicine

## 2020-02-18 ENCOUNTER — Ambulatory Visit: Payer: BLUE CROSS/BLUE SHIELD | Admitting: Emergency Medicine

## 2020-02-18 ENCOUNTER — Other Ambulatory Visit: Payer: Self-pay

## 2020-02-18 VITALS — BP 161/106 | HR 63 | Temp 97.4°F | Resp 16 | Ht 73.0 in | Wt 234.0 lb

## 2020-02-18 DIAGNOSIS — M109 Gout, unspecified: Secondary | ICD-10-CM

## 2020-02-18 DIAGNOSIS — N1831 Chronic kidney disease, stage 3a: Secondary | ICD-10-CM

## 2020-02-18 DIAGNOSIS — I482 Chronic atrial fibrillation, unspecified: Secondary | ICD-10-CM

## 2020-02-18 DIAGNOSIS — I10 Essential (primary) hypertension: Secondary | ICD-10-CM | POA: Diagnosis not present

## 2020-02-18 DIAGNOSIS — R7303 Prediabetes: Secondary | ICD-10-CM

## 2020-02-18 DIAGNOSIS — Z7901 Long term (current) use of anticoagulants: Secondary | ICD-10-CM

## 2020-02-18 DIAGNOSIS — E785 Hyperlipidemia, unspecified: Secondary | ICD-10-CM

## 2020-02-18 MED ORDER — AMLODIPINE BESYLATE 5 MG PO TABS: 5.0000 mg | ORAL_TABLET | Freq: Every day | ORAL | 3 refills | Status: DC

## 2020-02-18 MED ORDER — COLCHICINE 0.6 MG PO CAPS: ORAL_CAPSULE | ORAL | 1 refills | Status: DC

## 2020-02-18 MED ORDER — PREDNISONE 20 MG PO TABS: 20.0000 mg | ORAL_TABLET | Freq: Every day | ORAL | 0 refills | Status: AC

## 2020-02-18 NOTE — Patient Instructions (Addendum)
If you have lab work done today you will be contacted with your lab results within the next 2 weeks.  If you have not heard from [place] then please contact us. The fastest way to get your results is to register for My Chart.   IF you received an x-ray today, you will receive an invoice from [facility]. Please contact [facility] at [phone] with questions or concerns regarding your invoice.   IF you received labwork today, you will receive an invoice from [place]. Please contact [facility] at [phone] with questions or concerns regarding your invoice.   Our billing staff will not be able to assist you with questions regarding bills from these companies.  You will be contacted with the lab results as soon as they are available. The fastest way to get your results is to activate your My Chart account. Instructions are located on the last page of this paperwork. If you have not heard from [place] regarding the results in 2 weeks, please contact this office.     Gout  Gout is painful swelling of your joints. Gout is a type of arthritis. It is caused by having too much uric acid in your body. Uric acid is a chemical that is made when your body breaks down substances called purines. If your body has too much uric acid, sharp crystals can form and build up in your joints. This causes pain and swelling. Gout attacks can happen quickly and be very painful (acute gout). Over time, the attacks can affect more joints and happen more often (chronic gout). What are the causes?  Too much uric acid in your blood. This can happen because: ? Your kidneys do not remove enough uric acid from your blood. ? Your body makes too much uric acid. ? You eat too many foods that are high in purines. These foods include organ meats, some seafood, and beer.  Trauma or stress. What increases the risk?  Having a family history of gout.  Being male and middle-aged.  Being male and having gone  through menopause.  Being very overweight (obese).  Drinking alcohol, especially beer.  Not having enough water in the body (being dehydrated).  Losing weight too quickly.  Having an organ transplant.  Having lead poisoning.  Taking certain medicines.  Having kidney disease.  Having a skin condition called psoriasis. What are the signs or symptoms? An attack of acute gout usually happens in just one joint. The most common place is the big toe. Attacks often start at night. Other joints that may be affected include joints of the feet, ankle, knee, fingers, wrist, or elbow. Symptoms of an attack may include:  Very bad pain.  Warmth.  Swelling.  Stiffness.  Shiny, red, or purple skin.  Tenderness. The affected joint may be very painful to touch.  Chills and fever. Chronic gout may cause symptoms more often. More joints may be involved. You may also have white or yellow lumps (tophi) on your hands or feet or in other areas near your joints. How is this treated?  Treatment for this condition has two phases: treating an acute attack and preventing future attacks.  Acute gout treatment may include: ? NSAIDs. ? Steroids. These are taken by mouth or injected into a joint. ? Colchicine. This medicine relieves pain and swelling. It can be given by mouth or through an IV tube.  Preventive treatment may include: ? Taking small doses of NSAIDs or colchicine daily. ? Using a  medicine that reduces uric acid levels in your blood. ? Making changes to your diet. You may need to see a food expert (dietitian) about what to eat and drink to prevent gout. Follow these instructions at home: During a gout attack   If told, put ice on the painful area: ? Put ice in a plastic bag. ? Place a towel between your skin and the bag. ? Leave the ice on for 20 minutes, 2-3 times a day.  Raise (elevate) the painful joint above the level of your heart as often as you can.  Rest the joint as  much as possible. If the joint is in your leg, you may be given crutches.  Follow instructions from your doctor about what you cannot eat or drink. Avoiding future gout attacks  Eat a low-purine diet. Avoid foods and drinks such as: ? Liver. ? Kidney. ? Anchovies. ? Asparagus. ? Herring. ? Mushrooms. ? Mussels. ? Beer.  Stay at a healthy weight. If you want to lose weight, talk with your doctor. Do not lose weight too fast.  Start or continue an exercise plan as told by your doctor. Eating and drinking  Drink enough fluids to keep your pee (urine) pale yellow.  If you drink alcohol: ? Limit how much you use to:  0-1 drink a day for women.  0-2 drinks a day for men. ? Be aware of how much alcohol is in your drink. In the [place] one drink equals one 12 oz bottle of beer (355 mL), one 5 oz glass of wine (148 mL), or one 1 oz glass of hard liquor (44 mL). General instructions  Take over-the-counter and prescription medicines only as told by your doctor.  Do not drive or use heavy machinery while taking prescription pain medicine.  Return to your normal activities as told by your doctor. Ask your doctor what activities are safe for you.  Keep all follow-up visits as told by your doctor. This is important. Contact a doctor if:  You have another gout attack.  You still have symptoms of a gout attack after 10 days of treatment.  You have problems (side effects) because of your medicines.  You have chills or a fever.  You have burning pain when you pee (urinate).  You have pain in your lower back or belly. Get help right away if:  You have very bad pain.  Your pain cannot be controlled.  You cannot pee. Summary  Gout is painful swelling of the joints.  The most common site of pain is the big toe, but it can affect other joints.  Medicines and avoiding some foods can help to prevent and treat gout attacks. This information is not intended to replace advice given  to you by your health care provider. Make sure you discuss any questions you have with your health care provider. Document Revised: 07/05/2018 Document Reviewed: 07/05/2018 Elsevier Patient Education  2020 Elsevier Inc.  

## 2020-02-18 NOTE — Assessment & Plan Note (Signed)
Persistently elevated blood pressure.  Presently taking Bystolic 10 mg daily.  We will add amlodipine 5 mg daily.

## 2020-02-18 NOTE — Progress Notes (Signed)
[name] 59 y.o.   Chief Complaint  Patient presents with  . Follow-up    on gout pt started taking his medication friday when the flare began but pt reports little to no relief and is following up for possible alternatives    HISTORY OF PRESENT ILLNESS: This is a 59 y.o. male complaining of gout flareup.  Sharp severe pain to left first MTP joint. Has history of gout with recurrent flareups.  HPI   Prior to Admission medications   Medication Sig Start Date End Date Taking? Authorizing Provider  allopurinol (ZYLOPRIM) 100 MG tablet Take 0.5 tablets (50 mg total) by mouth daily. 01/16/19  Yes Alanis Clift, Ines Bloomer, MD  BYSTOLIC 10 MG tablet TAKE 1 TABLET(10 MG) BY MOUTH DAILY 01/20/20  Yes Rodneisha Bonnet, Ines Bloomer, MD  Colchicine 0.6 MG CAPS TAKE 2 CAPSULE BY MOUTH ONCE THEN 1 CAPSULE IN AN HOUR AFTER THAT 1 CAPSULE TWICE DAILY 11/20/19  Yes Ramani Riva, Ines Bloomer, MD  lisinopril (PRINIVIL,ZESTRIL) 10 MG tablet Take 1 tablet (10 mg total) by mouth daily. 01/16/19  Yes Shaine Newmark, Ines Bloomer, MD  Multiple Vitamin (MULTIVITAMIN WITH MINERALS) TABS Take 1 tablet by mouth daily. Reported on 07/14/2016   Yes [provider]  XARELTO 20 MG TABS tablet TAKE 1 TABLET BY MOUTH DAILY WITH SUPPER 01/20/20  Yes Husna Krone, Ines Bloomer, MD    Allergies  Allergen Reactions  . Codeine Itching  . Ace Inhibitors Swelling    Swelling of legs    Patient Active Problem List   Diagnosis Date Noted  . Hypertensive heart disease   . Dyslipidemia 02/02/2015  . Obesity (BMI 30-39.9) 09/10/2013  . Gout 03/09/2013  . Terminal aortic occlusion (HCC) 01/24/2013  . Atrial fibrillation, permanent ([place]) 01/24/2013  . Essential hypertension 01/24/2013  . Erectile dysfunction 01/24/2013  . CAD (coronary artery disease), non obstructive by cardiac cath 2009 01/24/2013    Past Medical History:  Diagnosis Date  . Atrial fibrillation, permanent ([place]) 01/24/2013   chronic,previuosly refuses coumadin,now  on xarelto  . CKD (chronic kidney disease) stage 3, GFR 30-59 ml/min 01/24/2013   Baseline creatinine roughly 1.7-1.8.  Marland Kitchen Erectile dysfunction   . Extremity ischemia, critical bil lower ext. 01/24/2013   Status post bilateral common femoral, profunda femoral and superficial femoral arterial embolectomy  . Gout   . H/O right and left cardiac catheterization November 2009   30-40% RCA disease, cardiac output Fick 6.3, thermodilution 5.3.  Normal artery pressures.  Marland Kitchen Heart murmur   . History of Non-ischemic cardiomyopathy 2009 - 2014   EF previously as low as 35-40% in 2009, up to 40 and 45% by 2012.  Repeat echo January 2014: Echo EF 55-60% mild LVH, mild to moderate left atrial enlargement, mild right atrial enlargement.  . History of TIA (transient ischemic attack) 2005   Carotid Dopplers January 2014 negative for significant stenosis.  . Hypertension   . Hypertensive heart disease   . OSA on CPAP    use DME- Choice titration study was on 02/21/13  . Pinched nerve in neck   . Terminal aortic occlusion ([place]) 01/24/2013   Status post embolectomy    Past Surgical History:  Procedure Laterality Date  . CARDIAC CATHETERIZATION  Nov 2009   right and left  heart cath ,cardiac output 6.3 by FICK AND 5.34 by thermal  diluation.norm RV pressures and nonobstructive cor disease. no shunt.some intramyocardial bridgingof the LAD  . CAROTID DOPPLERS  Nov 2009   done for TIA which were  normal  . DOPPLER ECHOCARDIOGRAPHY  02/2011; 12/2012   a) EF40-45%; LA mod to severe dilated ;; b) EF 55-60%, mild LVH, Mild-Mod LA dilation  . EMBOLECTOMY  01/23/2013   Procedure: EMBOLECTOMY;  Surgeon: Serafina Mitchell, MD;  Location: [facility];  Service: Vascular;  Laterality: Bilateral;  Bilateral femoral Embolectomy, Bilateral Iliac Embolectomy.  Marland Kitchen KNEE SURGERY      Social History   Socioeconomic History  . Marital status: Divorced    Spouse name: Not on file  . Number of children: Not on file  . Years of  education: Not on file  . Highest education level: Not on file  Occupational History  . Not on file  Tobacco Use  . Smoking status: Never Smoker  . Smokeless tobacco: Never Used  Substance and Sexual Activity  . Alcohol use: No    Alcohol/week: 0.0 standard drinks  . Drug use: No  . Sexual activity: Yes    Comment: married  Other Topics Concern  . Not on file  Social History Narrative  . Not on file   Social Determinants of Health   Financial Resource Strain:   . Difficulty of Paying Living Expenses: Not on file  Food Insecurity:   . Worried About Charity fundraiser in the Last Year: Not on file  . Ran Out of Food in the Last Year: Not on file  Transportation Needs:   . Lack of Transportation (Medical): Not on file  . Lack of Transportation (Non-Medical): Not on file  Physical Activity:   . Days of Exercise per Week: Not on file  . Minutes of Exercise per Session: Not on file  Stress:   . Feeling of Stress : Not on file  Social Connections:   . Frequency of Communication with Friends and Family: Not on file  . Frequency of Social Gatherings with Friends and Family: Not on file  . Attends Religious Services: Not on file  . Active Member of Clubs or Organizations: Not on file  . Attends Archivist Meetings: Not on file  . Marital Status: Not on file  Intimate Partner Violence:   . Fear of Current or Ex-Partner: Not on file  . Emotionally Abused: Not on file  . Physically Abused: Not on file  . Sexually Abused: Not on file    Family History  Problem Relation Age of Onset  . Hypertension Mother   . Stroke Mother   . Hypertension Father   . Stroke Father   . Hypertension Sister   . Hypertension Brother   . Hypertension Sister      Review of Systems  Constitutional: Negative.  Negative for chills and fever.  HENT: Negative.  Negative for congestion and sore throat.   Respiratory: Negative.  Negative for cough and shortness of breath.     Cardiovascular: Negative.  Negative for chest pain.  Gastrointestinal: Negative.  Negative for abdominal pain, diarrhea, nausea and vomiting.  Genitourinary: Negative.  Negative for dysuria.  Musculoskeletal: Positive for joint pain.  Skin: Negative.   Neurological: Negative.  Negative for dizziness and headaches.  All other systems reviewed and are negative.  Today's Vitals   02/18/20 1100  BP: (!) 161/106  Pulse: 63  Resp: 16  Temp: (!) 97.4 F (36.3 C)  TempSrc: Temporal  SpO2: 99%  Weight: 234 lb (106.1 kg)  Height: 6\' 1"  (1.854 m)   Body mass index is 30.87 kg/m. BP Readings from Last 3 Encounters:  02/18/20 (!) 161/106  11/20/19 Marland Kitchen)  134/100  01/16/19 (!) 154/110     Physical Exam Vitals reviewed.  Constitutional:      Appearance: Normal appearance.  HENT:     Head: Normocephalic.  Eyes:     Extraocular Movements: Extraocular movements intact.     Pupils: Pupils are equal, round, and reactive to light.  Cardiovascular:     Rate and Rhythm: Normal rate.  Pulmonary:     Effort: Pulmonary effort is normal.  Musculoskeletal:        General: Normal range of motion.     Cervical back: Normal range of motion and neck supple.     Comments: Left foot: First MTP joint is warm to touch, swollen and red, very tender to palpation.  Typical gout flareup.  Skin:    General: Skin is warm and dry.     Capillary Refill: Capillary refill takes less than 2 seconds.  Neurological:     General: No focal deficit present.     Mental Status: He is alert and oriented to person, place, and time.  Psychiatric:        Mood and Affect: Mood normal.        Behavior: Behavior normal.      ASSESSMENT & PLAN: Yahye was seen today for follow-up.  Diagnoses and all orders for this visit:  Acute gouty arthritis -     predniSONE (DELTASONE) 20 MG tablet; Take 1 tablet (20 mg total) by mouth daily with breakfast for 5 days. -     Colchicine 0.6 MG CAPS; TAKE 2 CAPSULE BY MOUTH  ONCE THEN 1 CAPSULE IN AN HOUR AFTER THAT 1 CAPSULE TWICE DAILY  Essential hypertension -     amLODipine (NORVASC) 5 MG tablet; Take 1 tablet (5 mg total) by mouth daily.  Chronic atrial fibrillation (HCC)  Dyslipidemia  Prediabetes  Stage 3a chronic kidney disease  Current use of long term anticoagulation    Patient Instructions       If you have lab work done today you will be contacted with your lab results within the next 2 weeks.  If you have not heard from [place] then please contact us. The fastest way to get your results is to register for My Chart.   IF you received an x-ray today, you will receive an invoice from [facility]. Please contact [facility] at [phone] with questions or concerns regarding your invoice.   IF you received labwork today, you will receive an invoice from [place]. Please contact [facility] at [phone] with questions or concerns regarding your invoice.   Our billing staff will not be able to assist you with questions regarding bills from these companies.  You will be contacted with the lab results as soon as they are available. The fastest way to get your results is to activate your My Chart account. Instructions are located on the last page of this paperwork. If you have not heard from [place] regarding the results in 2 weeks, please contact this office.     Gout  Gout is painful swelling of your joints. Gout is a type of arthritis. It is caused by having too much uric acid in your body. Uric acid is a chemical that is made when your body breaks down substances called purines. If your body has too much uric acid, sharp crystals can form and build up in your joints. This causes pain and swelling. Gout attacks can happen quickly and be very painful (acute gout). Over time, the attacks can affect more  joints and happen more often (chronic gout). What are the causes?  Too much uric acid in your blood. This can happen  because: ? Your kidneys do not remove enough uric acid from your blood. ? Your body makes too much uric acid. ? You eat too many foods that are high in purines. These foods include organ meats, some seafood, and beer.  Trauma or stress. What increases the risk?  Having a family history of gout.  Being male and middle-aged.  Being male and having gone through menopause.  Being very overweight (obese).  Drinking alcohol, especially beer.  Not having enough water in the body (being dehydrated).  Losing weight too quickly.  Having an organ transplant.  Having lead poisoning.  Taking certain medicines.  Having kidney disease.  Having a skin condition called psoriasis. What are the signs or symptoms? An attack of acute gout usually happens in just one joint. The most common place is the big toe. Attacks often start at night. Other joints that may be affected include joints of the feet, ankle, knee, fingers, wrist, or elbow. Symptoms of an attack may include:  Very bad pain.  Warmth.  Swelling.  Stiffness.  Shiny, red, or purple skin.  Tenderness. The affected joint may be very painful to touch.  Chills and fever. Chronic gout may cause symptoms more often. More joints may be involved. You may also have white or yellow lumps (tophi) on your hands or feet or in other areas near your joints. How is this treated?  Treatment for this condition has two phases: treating an acute attack and preventing future attacks.  Acute gout treatment may include: ? NSAIDs. ? Steroids. These are taken by mouth or injected into a joint. ? Colchicine. This medicine relieves pain and swelling. It can be given by mouth or through an IV tube.  Preventive treatment may include: ? Taking small doses of NSAIDs or colchicine daily. ? Using a medicine that reduces uric acid levels in your blood. ? Making changes to your diet. You may need to see a food expert (dietitian) about what to eat  and drink to prevent gout. Follow these instructions at home: During a gout attack   If told, put ice on the painful area: ? Put ice in a plastic bag. ? Place a towel between your skin and the bag. ? Leave the ice on for 20 minutes, 2-3 times a day.  Raise (elevate) the painful joint above the level of your heart as often as you can.  Rest the joint as much as possible. If the joint is in your leg, you may be given crutches.  Follow instructions from your doctor about what you cannot eat or drink. Avoiding future gout attacks  Eat a low-purine diet. Avoid foods and drinks such as: ? Liver. ? Kidney. ? Anchovies. ? Asparagus. ? Herring. ? Mushrooms. ? Mussels. ? Beer.  Stay at a healthy weight. If you want to lose weight, talk with your doctor. Do not lose weight too fast.  Start or continue an exercise plan as told by your doctor. Eating and drinking  Drink enough fluids to keep your pee (urine) pale yellow.  If you drink alcohol: ? Limit how much you use to:  0-1 drink a day for women.  0-2 drinks a day for men. ? Be aware of how much alcohol is in your drink. In the [place] one drink equals one 12 oz bottle of beer (355 mL), one 5 oz glass  of wine (148 mL), or one 1 oz glass of hard liquor (44 mL). General instructions  Take over-the-counter and prescription medicines only as told by your doctor.  Do not drive or use heavy machinery while taking prescription pain medicine.  Return to your normal activities as told by your doctor. Ask your doctor what activities are safe for you.  Keep all follow-up visits as told by your doctor. This is important. Contact a doctor if:  You have another gout attack.  You still have symptoms of a gout attack after 10 days of treatment.  You have problems (side effects) because of your medicines.  You have chills or a fever.  You have burning pain when you pee (urinate).  You have pain in your lower back or belly. Get help  right away if:  You have very bad pain.  Your pain cannot be controlled.  You cannot pee. Summary  Gout is painful swelling of the joints.  The most common site of pain is the big toe, but it can affect other joints.  Medicines and avoiding some foods can help to prevent and treat gout attacks. This information is not intended to replace advice given to you by your health care provider. Make sure you discuss any questions you have with your health care provider. Document Revised: 07/05/2018 Document Reviewed: 07/05/2018 Elsevier Patient Education  2020 Elsevier Inc.      Agustina Caroli, MD [facility]

## 2020-03-12 ENCOUNTER — Telehealth: Payer: Self-pay | Admitting: *Deleted

## 2020-03-12 DIAGNOSIS — I10 Essential (primary) hypertension: Secondary | ICD-10-CM

## 2020-03-12 MED ORDER — NEBIVOLOL HCL 10 MG PO TABS: ORAL_TABLET | ORAL | 3 refills | Status: DC

## 2020-03-12 NOTE — Telephone Encounter (Signed)
Spoke to patient about refill request for Xarelto, per patient he has only 4 tablets left. Refill with add'l 2 faxed to [place] at [facility]. Confirmation page at 5:00 pm.

## 2020-03-12 NOTE — Telephone Encounter (Signed)
Refilled Bystolic medication for hypertension, spoke to Dr Mitchel Honour.

## 2020-05-20 ENCOUNTER — Other Ambulatory Visit: Payer: Self-pay | Admitting: *Deleted

## 2020-05-20 DIAGNOSIS — M109 Gout, unspecified: Secondary | ICD-10-CM

## 2020-05-20 MED ORDER — COLCHICINE 0.6 MG PO CAPS: ORAL_CAPSULE | ORAL | 1 refills | Status: DC

## 2020-05-23 ENCOUNTER — Ambulatory Visit (INDEPENDENT_AMBULATORY_CARE_PROVIDER_SITE_OTHER): Payer: BLUE CROSS/BLUE SHIELD | Admitting: Family Medicine

## 2020-05-23 ENCOUNTER — Emergency Department (HOSPITAL_BASED_OUTPATIENT_CLINIC_OR_DEPARTMENT_OTHER)
Admit: 2020-05-23 | Discharge: 2020-05-23 | Disposition: A | Discharge status: Home / Self Care | Payer: BLUE CROSS/BLUE SHIELD | Attending: Family Medicine | Admitting: Family Medicine

## 2020-05-23 ENCOUNTER — Encounter: Payer: Self-pay | Admitting: Family Medicine

## 2020-05-23 ENCOUNTER — Other Ambulatory Visit: Payer: Self-pay

## 2020-05-23 ENCOUNTER — Emergency Department (HOSPITAL_COMMUNITY)
Admission: EM | Admit: 2020-05-23 | Discharge: 2020-05-23 | Disposition: A | Discharge status: Home / Self Care | Payer: BLUE CROSS/BLUE SHIELD | Attending: Emergency Medicine | Admitting: Emergency Medicine

## 2020-05-23 VITALS — BP 165/123 | HR 74 | Temp 98.4°F | Ht 73.5 in | Wt 211.8 lb

## 2020-05-23 DIAGNOSIS — M7051 Other bursitis of knee, right knee: Secondary | ICD-10-CM

## 2020-05-23 DIAGNOSIS — Z8739 Personal history of other diseases of the musculoskeletal system and connective tissue: Secondary | ICD-10-CM

## 2020-05-23 DIAGNOSIS — M79661 Pain in right lower leg: Secondary | ICD-10-CM | POA: Insufficient documentation

## 2020-05-23 DIAGNOSIS — M79604 Pain in right leg: Secondary | ICD-10-CM | POA: Diagnosis not present

## 2020-05-23 DIAGNOSIS — Z5321 Procedure and treatment not carried out due to patient leaving prior to being seen by health care provider: Secondary | ICD-10-CM | POA: Diagnosis not present

## 2020-05-23 DIAGNOSIS — R7303 Prediabetes: Secondary | ICD-10-CM

## 2020-05-23 LAB — POCT CBC
Granulocyte percent: 68 %G (ref 37–80)
HCT, POC: 44.1 % — AB (ref 29–41)
Hemoglobin: 14.2 g/dL (ref 11–14.6)
Lymph, poc: 1.8 (ref 0.6–3.4)
MCH, POC: 33 pg — AB (ref 27–31.2)
MCHC: 32.3 g/dL (ref 31.8–35.4)
MCV: 102 fL (ref 76–111)
MID (cbc): 0.3 (ref 0–0.9)
MPV: 8.5 fL (ref 0–99.8)
POC Granulocyte: 4.5 (ref 2–6.9)
POC LYMPH PERCENT: 26.8 %L (ref 10–50)
POC MID %: 5.2 %M (ref 0–12)
Platelet Count, POC: 158 10*3/uL (ref 142–424)
RBC: 4.32 M/uL — AB (ref 4.69–6.13)
RDW, POC: 13.9 %
WBC: 6.6 10*3/uL (ref 4.6–10.2)

## 2020-05-23 LAB — GLUCOSE, POCT (MANUAL RESULT ENTRY): POC Glucose: 91 mg/dl (ref 70–99)

## 2020-05-23 MED ORDER — PREDNISONE 20 MG PO TABS: ORAL_TABLET | ORAL | 0 refills | Status: DC

## 2020-05-23 NOTE — Progress Notes (Signed)
Lower extremity venous has been completed.   Preliminary results in CV Proc.   Abram Sander 05/23/2020 4:09 PM

## 2020-05-23 NOTE — Progress Notes (Signed)
Patient ID: Isaiah [name], male    DOB: [birth year]  Age: 59 y.o. MRN: [id number]  Chief Complaint  Patient presents with  . Foot Pain    Pt stated that he has been having some Rt foot pain since yesterday and it is numb and he can hardly work on it.    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  Since yesterday he has been having pain in his right leg.  Actually had a little pain before then.  The pain is primarily in the right knee, swelling in the right leg, and pain in the right foot.  He has a history of having had some sort of thromboses in the past and is on Xarelto for that.  No trauma.  He also has had some gout in the past but never in the knee area.  He has not been sick or febrile.  He denies being diabetic though he has had some prediabetic [place] type numbers in the past in the chart.  He does not smoke.  Lives with his wife.  He is off for the next 3 days.  Goes back to work Monday night.  He was working hard yesterday and all night and did not take his blood pressures medicines this morning.   No headache or dizziness   Current allergies, medications, problem list, past/family and social histories reviewed.  Objective:  BP (!) 165/123 (BP Location: Right Arm, Patient Position: Sitting, Cuff Size: Large)   Pulse 74   Temp 98.4 F (36.9 C) (Temporal)   Ht 6' 1.5" (1.867 m)   Wt 211 lb 12.8 oz (96.1 kg)   SpO2 97%   BMI 27.56 kg/m  142/112 Alert and oriented, large man.  Chest clear.  Heart regular.  Tender right knee in the patellar tendon and prepatellar area.  Very tender on the medial part of the tibial tuberosity.  Mild tenderness of the calf.  Very tender on the lateral aspect of the right foot making testing for Bevelyn Buckles' sign a little difficult.  Pedal pulses are faintly palpable.  Glucose 91 Assessment & Plan:   Assessment: 1. Pain of right lower extremity   2. Patellar bursitis of right knee   3. History of gout   4.  Prediabetes       Plan: Difficult to be sure what is going on with him.  The calf is tight and I that I feel like I need to make certain he does not have a clot.  We are on Friday afternoon of holiday weekend, and it may become difficult getting a scan later so I am going ahead and getting the ultrasound of the calf.  We will add a D-dimer to the labs that I did.  Could be gout.  Will treat with some prednisone.  His blood pressure was quite high but it was coming down while seated in the office.  Told him to take his blood pressure medicines for today as soon as he got home and to change the amlodipine to 10 mg dose.  See instructions.  Orders Placed This Encounter  Procedures  . CMP14+EGFR  . Hemoglobin A1c  . Uric acid  . POCT glucose (manual entry)  . POCT CBC    Meds ordered this encounter  Medications  . predniSONE (DELTASONE) 20 MG tablet    Sig: Take 3 daily for 2 days, then 2 daily for 2 days, then 1 daily for  2 days for gout flare    Dispense:  12 tablet    Refill:  0         Patient Instructions   Go get the ultrasound of the leg now as directed.  We need to be certain you do not have any clotting in the vein, which would be unlikely since you are on blood thinner already.  You are to go to the emergency room at [facility] and have them direct you to radiology.  Keep your leg elevated as much of the time as possible.  I have sent in a prescription for prednisone for what I suspect is a flare of gout in your knee and foot.  Take the prednisone 20 mg 3 pills initially today, then 3 tomorrow, then 2 daily for 2 days, then 1 daily for 2 days.  Take the pills at the same time with a little food.  Your blood pressure is very high.  Go home and take your blood pressure medicine for today.  Increase the amlodipine blood pressure to 10 mg (2 x 5 mg) through the weekend.  Find someone to check your blood pressure for you each day or get yourself a blood pressure cuff for  home use.  Write down the pressures.  We would like to see her pressure running below 140/90.  The reading just now was 142/112.  If you are getting worse through the weekend you would have to go to the emergency room for further evaluation.  This is a holiday weekend and offices are closed through Monday.   If you have lab work done today you will be contacted with your lab results within the next 2 weeks.  If you have not heard from [place] then please contact us. The fastest way to get your results is to register for My Chart.   IF you received an x-ray today, you will receive an invoice from [facility]. Please contact [facility] at [phone] with questions or concerns regarding your invoice.   IF you received labwork today, you will receive an invoice from [place]. Please contact [facility] at [phone] with questions or concerns regarding your invoice.   Our billing staff will not be able to assist you with questions regarding bills from these companies.  You will be contacted with the lab results as soon as they are available. The fastest way to get your results is to activate your My Chart account. Instructions are located on the last page of this paperwork. If you have not heard from [place] regarding the results in 2 weeks, please contact this office.       Follow-up with Dr. Mitchel Honour next week. Return if symptoms worsen or fail to improve.   Ruben Reason, MD 05/23/2020

## 2020-05-23 NOTE — Patient Instructions (Addendum)
Go get the ultrasound of the leg now as directed.  We need to be certain you do not have any clotting in the vein, which would be unlikely since you are on blood thinner already.  You are to go to the emergency room at [facility] and have them direct you to radiology.  Keep your leg elevated as much of the time as possible.  I have sent in a prescription for prednisone for what I suspect is a flare of gout in your knee and foot.  Take the prednisone 20 mg 3 pills initially today, then 3 tomorrow, then 2 daily for 2 days, then 1 daily for 2 days.  Take the pills at the same time with a little food.  Your blood pressure is very high.  Go home and take your blood pressure medicine for today.  Increase the amlodipine blood pressure to 10 mg (2 x 5 mg) through the weekend.  Find someone to check your blood pressure for you each day or get yourself a blood pressure cuff for home use.  Write down the pressures.  We would like to see her pressure running below 140/90.  The reading just now was 142/112.  If you are getting worse through the weekend you would have to go to the emergency room for further evaluation.  This is a holiday weekend and offices are closed through Monday.   If you have lab work done today you will be contacted with your lab results within the next 2 weeks.  If you have not heard from [place] then please contact us. The fastest way to get your results is to register for My Chart.   IF you received an x-ray today, you will receive an invoice from [facility]. Please contact [facility] at [phone] with questions or concerns regarding your invoice.   IF you received labwork today, you will receive an invoice from [place]. Please contact [facility] at [phone] with questions or concerns regarding your invoice.   Our billing staff will not be able to assist you with questions regarding bills from these companies.  You will be contacted with the lab results as  soon as they are available. The fastest way to get your results is to activate your My Chart account. Instructions are located on the last page of this paperwork. If you have not heard from [place] regarding the results in 2 weeks, please contact this office.

## 2020-05-24 LAB — CMP14+EGFR
ALT: 41 IU/L (ref 0–44)
AST: 46 IU/L — ABNORMAL HIGH (ref 0–40)
Albumin/Globulin Ratio: 1.7 (ref 1.2–2.2)
Albumin: 4.3 g/dL (ref 3.8–4.9)
Alkaline Phosphatase: 124 IU/L — ABNORMAL HIGH (ref 48–121)
BUN/Creatinine Ratio: 12 (ref 9–20)
BUN: 28 mg/dL — ABNORMAL HIGH (ref 6–24)
Bilirubin Total: 0.5 mg/dL (ref 0.0–1.2)
CO2: 19 mmol/L — ABNORMAL LOW (ref 20–29)
Calcium: 10.9 mg/dL — ABNORMAL HIGH (ref 8.7–10.2)
Chloride: 105 mmol/L (ref 96–106)
Creatinine, Ser: 2.41 mg/dL — ABNORMAL HIGH (ref 0.76–1.27)
GFR calc Af Amer: 33 mL/min/{1.73_m2} — ABNORMAL LOW (ref >59)
GFR calc non Af Amer: 29 mL/min/{1.73_m2} — ABNORMAL LOW (ref >59)
Globulin, Total: 2.5 g/dL (ref 1.5–4.5)
Glucose: 94 mg/dL (ref 65–99)
Potassium: 4 mmol/L (ref 3.5–5.2)
Sodium: 140 mmol/L (ref 134–144)
Total Protein: 6.8 g/dL (ref 6.0–8.5)

## 2020-05-24 LAB — HEMOGLOBIN A1C
Est. average glucose Bld gHb Est-mCnc: 120 mg/dL
Hgb A1c MFr Bld: 5.8 % — ABNORMAL HIGH (ref 4.8–5.6)

## 2020-05-24 LAB — URIC ACID: Uric Acid: 11.1 mg/dL — ABNORMAL HIGH (ref 3.8–8.4)

## 2020-05-24 NOTE — Progress Notes (Signed)
I called patient re: labs.  Urged to drink a lot of water and return on Thursday as scheduled.  Ruben Reason MD

## 2020-05-29 ENCOUNTER — Other Ambulatory Visit: Payer: Self-pay

## 2020-05-29 ENCOUNTER — Encounter: Payer: Self-pay | Admitting: Family Medicine

## 2020-05-29 ENCOUNTER — Ambulatory Visit: Payer: BLUE CROSS/BLUE SHIELD | Admitting: Family Medicine

## 2020-05-29 VITALS — BP 149/110 | HR 86 | Temp 98.2°F | Ht 73.5 in | Wt 241.0 lb

## 2020-05-29 DIAGNOSIS — N289 Disorder of kidney and ureter, unspecified: Secondary | ICD-10-CM

## 2020-05-29 DIAGNOSIS — I10 Essential (primary) hypertension: Secondary | ICD-10-CM | POA: Diagnosis not present

## 2020-05-29 DIAGNOSIS — M109 Gout, unspecified: Secondary | ICD-10-CM | POA: Diagnosis not present

## 2020-05-29 MED ORDER — ALLOPURINOL 300 MG PO TABS: 300.0000 mg | ORAL_TABLET | Freq: Every day | ORAL | 6 refills | Status: DC

## 2020-05-29 MED ORDER — AMLODIPINE BESYLATE 10 MG PO TABS: 10.0000 mg | ORAL_TABLET | Freq: Every day | ORAL | 5 refills | Status: DC

## 2020-05-29 MED ORDER — COLCHICINE 0.6 MG PO CAPS: ORAL_CAPSULE | ORAL | 1 refills | Status: DC

## 2020-05-29 MED ORDER — NEBIVOLOL HCL 20 MG PO TABS: ORAL_TABLET | ORAL | 6 refills | Status: DC

## 2020-05-29 NOTE — Progress Notes (Signed)
No follow-ups on file.   Ruben Reason, MD 05/29/2020  Patient ID: Isaiah [name], male    DOB: 01-15-1961  Age: 59 y.o. MRN: [id number]  Chief Complaint  Patient presents with  . Follow-up    x1 week for foot pain    Subjective:   Patient was seen by me last week.  He had a right knee foot and calf pain.  Ultrasound showed no evidence of clot.  Labs showed a significant elevated uric acid.  He is improved since taking the course of prednisone.  Still hurts some in the knee and foot.  Especially bad when he stands long hours at his work shift.  We had a long talk about that but they do not seem to be a lot of other options for his job.  He works 12-hour shifts.  Blood pressure is as noted.  He says he just came off work and that it always is worse when he comes off work.  However he needs better control than he has so we will change the medicines.  Current allergies, medications, problem list, past/family and social histories reviewed.  Objective:  BP (!) 149/110 (BP Location: Left Arm, Patient Position: Sitting, Cuff Size: Large)   Pulse 86   Temp 98.2 F (36.8 C) (Temporal)   Ht 6' 1.5" (1.867 m)   Wt 241 lb (109.3 kg)   SpO2 98%   BMI 31.36 kg/m   Calf is not as swollen and is nontender.  The inflammation of the knee is subsided.  Foot still has some pain laterally of the right foot. Assessment & Plan:   Assessment: 1. Renal insufficiency   2. Essential hypertension   3. Acute gouty arthritis       Plan: See instructions.  On return he needs to have a repeat uric acid and renal functions.  I did order a BMP for today and if the kidney function is too bad we will need to refer him onto a nephrologist.  However his numbers were about the same 2 years ago and then improved.  Urged him to drink plenty of water. Orders Placed This Encounter  Procedures  . Basic metabolic panel    Meds ordered this encounter  Medications  . allopurinol (ZYLOPRIM) 300 MG tablet    Sig:  Take 1 tablet (300 mg total) by mouth daily.    Dispense:  30 tablet    Refill:  6  . amLODipine (NORVASC) 10 MG tablet    Sig: Take 1 tablet (10 mg total) by mouth daily.    Dispense:  30 tablet    Refill:  5  . Nebivolol HCl (BYSTOLIC) 20 MG TABS    Sig: Take 1 daily (20 mg) for blood pressure    Dispense:  30 tablet    Refill:  6    30 day courtesy refill/pt needs appt for further refills  . Colchicine 0.6 MG CAPS    Sig: TAKE 2 CAPSULE BY MOUTH ONCE THEN 1 CAPSULE IN AN HOUR AFTER THAT 1 CAPSULE TWICE DAILY    Dispense:  30 capsule    Refill:  1         Patient Instructions    Resume taking allopurinol.  I would like you to take one half of 300 mg daily for about 1 week, then increase to a whole pill daily.  This is to prevent further flareups of gout and I recommend you take it long-term.  Crease the Bystolic  If  you have lab work done today you will be contacted with your lab results within the next 2 weeks.  If you have not heard from [place] then please contact us. The fastest way to get your results is to register for My Chart.   IF you received an x-ray today, you will receive an invoice from [facility]. Please contact [facility] at [phone] with questions or concerns regarding your invoice.   IF you received labwork today, you will receive an invoice from [place]. Please contact [facility] at [phone] with questions or concerns regarding your invoice.   Our billing staff will not be able to assist you with questions regarding bills from these companies.  You will be contacted with the lab results as soon as they are available. The fastest way to get your results is to activate your My Chart account. Instructions are located on the last page of this paperwork. If you have not heard from [place] regarding the results in 2 weeks, please contact this office.        Return in about 5 weeks (around 07/03/2020), or Dr. Mitchel Honour, for Gout and kidney  function.   Ruben Reason, MD 6/3/2021Patient ID: Isaiah [name], male    DOB: 04/20/1961  Age: 59 y.o. MRN: [id number]       Patient Instructions       If you have lab work done today you will be contacted with your lab results within the next 2 weeks.  If you have not heard from [place] then please contact us. The fastest way to get your results is to register for My Chart.   IF you received an x-ray today, you will receive an invoice from [facility]. Please contact [facility]iology at [phone] with questions or concerns regarding your invoice.   IF you received labwork today, you will receive an invoice from [place]. Please contact [facility] at [phone] with questions or concerns regarding your invoice.   Our billing staff will not be able to assist you with questions regarding bills from these companies.  You will be contacted with the lab results as soon as they are available. The fastest way to get your results is to activate your My Chart account. Instructions are located on the last page of this paperwork. If you have not heard from [place] regarding the results in 2 weeks, please contact this office.        No follow-ups on file.   Ruben Reason, MD 05/29/2020

## 2020-05-29 NOTE — Patient Instructions (Addendum)
  Resume taking allopurinol.  I would like you to take one half of 300 mg daily for about 1 week, then increase to a whole pill daily.  This is to prevent further flareups of gout and I recommend you take it long-term.  Increase the Bystolic (nebivolol) to 20 mg daily for blood pressure  Increase the Norvasc to 10 mg daily for blood pressure  I am giving you a prescription for colchicine to have on hand for acute flares of gout, not to be taken regularly.  Make a follow-up appointment to see Dr. Mitchel Honour in 4 to 6-week.  Labs should be repeated at that time regarding the gout and kidney function.  Return sooner if problems  If you have lab work done today you will be contacted with your lab results within the next 2 weeks.  If you have not heard from [place] then please contact us. The fastest way to get your results is to register for My Chart.   IF you received an x-ray today, you will receive an invoice from [facility]. Please contact [facility]iology at [phone] with questions or concerns regarding your invoice.   IF you received labwork today, you will receive an invoice from [place]. Please contact [facility] at [phone] with questions or concerns regarding your invoice.   Our billing staff will not be able to assist you with questions regarding bills from these companies.  You will be contacted with the lab results as soon as they are available. The fastest way to get your results is to activate your My Chart account. Instructions are located on the last page of this paperwork. If you have not heard from [place] regarding the results in 2 weeks, please contact this office.

## 2020-05-30 LAB — BASIC METABOLIC PANEL
BUN/Creatinine Ratio: 15 (ref 9–20)
BUN: 40 mg/dL — ABNORMAL HIGH (ref 6–24)
CO2: 21 mmol/L (ref 20–29)
Calcium: 10.1 mg/dL (ref 8.7–10.2)
Chloride: 106 mmol/L (ref 96–106)
Creatinine, Ser: 2.66 mg/dL — ABNORMAL HIGH (ref 0.76–1.27)
GFR calc Af Amer: 29 mL/min/{1.73_m2} — ABNORMAL LOW (ref >59)
GFR calc non Af Amer: 25 mL/min/{1.73_m2} — ABNORMAL LOW (ref >59)
Glucose: 92 mg/dL (ref 65–99)
Potassium: 3.5 mmol/L (ref 3.5–5.2)
Sodium: 141 mmol/L (ref 134–144)

## 2020-06-05 ENCOUNTER — Ambulatory Visit: Payer: BLUE CROSS/BLUE SHIELD | Admitting: Emergency Medicine

## 2020-06-11 ENCOUNTER — Telehealth: Payer: Self-pay | Admitting: Emergency Medicine

## 2020-06-11 NOTE — Telephone Encounter (Signed)
Pt dropped off an extension on current FMLA paperwork to be completed by provider / placed in Provider/CMA box at nurses station

## 2020-06-11 NOTE — Telephone Encounter (Signed)
Filled out protions that could be and placed this in the providers box to be filled out.

## 2020-06-16 ENCOUNTER — Telehealth: Payer: Self-pay | Admitting: *Deleted

## 2020-06-16 ENCOUNTER — Other Ambulatory Visit: Payer: Self-pay | Admitting: Emergency Medicine

## 2020-06-16 ENCOUNTER — Telehealth: Payer: Self-pay | Admitting: Emergency Medicine

## 2020-06-16 NOTE — Telephone Encounter (Signed)
Doctor has fmla forms, pt will be contacted when completed

## 2020-06-16 NOTE — Telephone Encounter (Signed)
Patient was contacted by Marianna Fuss (clerical) and patient wanted documents faxed instead of pick up at the office. Faxed FMLA documents with Leave [id number] with employee [phone] to [employer]. Confirmation page 4:37 pm.

## 2020-06-16 NOTE — Telephone Encounter (Signed)
Patient is calling back to make  sure Dr. Mitchel Honour will have paperwork signed off today   FMLA paperwork that was  due last Thursday but [employer] gave him an ext because provider was out . His deadline is today .   Please call patient when paperwork has been faxed . [phone]

## 2020-06-20 ENCOUNTER — Telehealth: Payer: Self-pay | Admitting: Emergency Medicine

## 2020-06-20 NOTE — Telephone Encounter (Signed)
FMLA paperwork from [employer] came via fax to be completed by provider/ placed paperwork in Provider [place] box   Pt not bpresent

## 2020-06-23 NOTE — Telephone Encounter (Signed)
Patient called to let Dr. Mitchel Honour know that he needs FMLA paperwork to be extended from 4 days to 6 days a month and would like a callback once completed. Please advise

## 2020-06-24 NOTE — Telephone Encounter (Signed)
Please Advise

## 2020-06-24 NOTE — Telephone Encounter (Signed)
Please Advise on this 

## 2020-06-24 NOTE — Telephone Encounter (Signed)
Okay to extend.  Thanks.

## 2020-06-25 ENCOUNTER — Telehealth: Payer: Self-pay | Admitting: *Deleted

## 2020-06-25 NOTE — Telephone Encounter (Signed)
On June 24 2020, faxed FMLA to [employer] claims with employee ID: [id number]. Faxed number [phone]. Confirmation page 5:56 pm.

## 2020-06-25 NOTE — Telephone Encounter (Signed)
FMLA paper work faxed 06/24/2020 to [employer] claims. Confirmation page 5:56 pm. Telephone note in chart.

## 2020-06-26 ENCOUNTER — Telehealth: Payer: Self-pay | Admitting: *Deleted

## 2020-06-26 NOTE — Telephone Encounter (Signed)
Spoke to patient about the correction on the FMLA form, per patient [employer] is requesting frequency to be 6 times per 6 months. Duration or 6 days. I suggested to the patient to come to the office before 5 pm today and review the change, but it was reviewed by phone. I will re-fax the correction to [employer].

## 2020-06-26 NOTE — Telephone Encounter (Signed)
Spoke to patient today because he called to say [employer] requested the dates to be changed to four days to six days/six months. I suggested the patient come to the office to review the form today before re-faxing. He reviewed it while I was on the phone with him, instead.

## 2020-06-26 NOTE — Telephone Encounter (Signed)
Patient is calling back .  Provider okd extension/ [employer] is needing  on the line that  states  weeks months and days. [employer] needs it to say 6 days instead of 4 days in a month .   Any questions please call patient at [phone]   Pt is unclear as to why Angelique Holm is being so picky this time

## 2020-07-24 ENCOUNTER — Ambulatory Visit: Payer: BLUE CROSS/BLUE SHIELD | Admitting: Family Medicine

## 2020-07-24 ENCOUNTER — Encounter: Payer: Self-pay | Admitting: Family Medicine

## 2020-07-24 ENCOUNTER — Other Ambulatory Visit: Payer: Self-pay

## 2020-07-24 VITALS — BP 140/109 | HR 65 | Temp 97.9°F | Ht 73.5 in | Wt 232.0 lb

## 2020-07-24 DIAGNOSIS — I10 Essential (primary) hypertension: Secondary | ICD-10-CM | POA: Diagnosis not present

## 2020-07-24 DIAGNOSIS — N289 Disorder of kidney and ureter, unspecified: Secondary | ICD-10-CM

## 2020-07-24 DIAGNOSIS — M109 Gout, unspecified: Secondary | ICD-10-CM | POA: Diagnosis not present

## 2020-07-24 MED ORDER — PREDNISONE 20 MG PO TABS: 40.0000 mg | ORAL_TABLET | Freq: Every day | ORAL | 0 refills | Status: DC

## 2020-07-24 MED ORDER — COLCHICINE 0.6 MG PO CAPS: ORAL_CAPSULE | ORAL | 5 refills | Status: DC

## 2020-07-24 NOTE — Progress Notes (Signed)
Subjective:  Patient ID: Isaiah [name], male    DOB: 06-18-1961  Age: 59 y.o. MRN: [id number]  CC:  Chief Complaint  Patient presents with  . gout flare up of L ankle approx 1 day    medication refill prednisone    HPI Isaiah [name] presents for   Gout flare:  Prior note review.  Most recently seen in June by Dr. Linna Darner.  Has been seen week prior with right knee foot and calf pain.  Ultrasound without evidence of DVT.  Elevated uric acid, treated with prednisone, symptoms improved.  Still some knee and foot pain at that time.  Decreased blood pressure control at that time as well.   Recommended June 3rd visit to restart allopurinol 300 mg 1/2 pill/day initially for 1 week, then full pill per day.  Bystolic was increased to 20 mg, Norvasc increased to 10 mg, and colchicine given if needed for acute flare of gout.  4 to 6-week follow-up with primary care provider recommended.  Renal function since 2017, creatinine has ranged from low of 1.91 in January 2020 to high of 2.66 recently on June 3rd.  2.34 in January 2019, 2.41 in May of this year Last gfr 6/3 - 29.  Last nephrology eval in 07/2018.  Note from nephrology in August 2019, nephrologist Dr. Moshe Cipro.  Chronic kidney disease stage G3 B/A3 with prior renal ultrasound medical renal disease.  L ankle pain started last night. No injury. Feels like gout flare. Some pain in left great toe, but more in ankle. Hurts to move. Able to weight bear.  Restarted allopurinol, but nonadherent - 4 times per week. forsgets to take.  Out of bystolic for about a week, back on meds past 4 days.  Taking amlodipine - not taking past week, forgets to take. Working 12 hour shifts pain and [employer] company.  No chest pain, focal weakness, headaches.  No side effects on meds. BP on both meds - 130/90's.   Tx; colchicine- last night and today 2 pills last night, 1 today.   Lab Results  Component Value Date   LABURIC 10.0 (H) 07/24/2020   Lab  Results  Component Value Date   CREATININE 2.58 (H) 07/24/2020  '    History Patient Active Problem List   Diagnosis Date Noted  . Hypertensive heart disease   . Dyslipidemia 02/02/2015  . Obesity (BMI 30-39.9) 09/10/2013  . Gout 03/09/2013  . Terminal aortic occlusion (HCC) 01/24/2013  . Atrial fibrillation, permanent ([place]) 01/24/2013  . Essential hypertension 01/24/2013  . Erectile dysfunction 01/24/2013  . CAD (coronary artery disease), non obstructive by cardiac cath 2009 01/24/2013   Past Medical History:  Diagnosis Date  . Atrial fibrillation, permanent ([place]) 01/24/2013   chronic,previuosly refuses coumadin,now on xarelto  . CKD (chronic kidney disease) stage 3, GFR 30-59 ml/min 01/24/2013   Baseline creatinine roughly 1.7-1.8.  Marland Kitchen Erectile dysfunction   . Extremity ischemia, critical bil lower ext. 01/24/2013   Status post bilateral common femoral, profunda femoral and superficial femoral arterial embolectomy  . Gout   . H/O right and left cardiac catheterization November 2009   30-40% RCA disease, cardiac output Fick 6.3, thermodilution 5.3.  Normal artery pressures.  Marland Kitchen Heart murmur   . History of Non-ischemic cardiomyopathy 2009 - 2014   EF previously as low as 35-40% in 2009, up to 40 and 45% by 2012.  Repeat echo January 2014: Echo EF 55-60% mild LVH, mild to moderate left atrial enlargement, mild right atrial  enlargement.  . History of TIA (transient ischemic attack) 2005   Carotid Dopplers January 2014 negative for significant stenosis.  . Hypertension   . Hypertensive heart disease   . OSA on CPAP    use DME- Choice titration study was on 02/21/13  . Pinched nerve in neck   . Terminal aortic occlusion ([place]) 01/24/2013   Status post embolectomy   Past Surgical History:  Procedure Laterality Date  . CARDIAC CATHETERIZATION  Nov 2009   right and left  heart cath ,cardiac output 6.3 by FICK AND 5.34 by thermal  diluation.norm RV pressures and nonobstructive cor  disease. no shunt.some intramyocardial bridgingof the LAD  . CAROTID DOPPLERS  Nov 2009   done for TIA which were normal  . DOPPLER ECHOCARDIOGRAPHY  02/2011; 12/2012   a) EF40-45%; LA mod to severe dilated ;; b) EF 55-60%, mild LVH, Mild-Mod LA dilation  . EMBOLECTOMY  01/23/2013   Procedure: EMBOLECTOMY;  Surgeon: Serafina Mitchell, MD;  Location: [facility] OR;  Service: Vascular;  Laterality: Bilateral;  Bilateral femoral Embolectomy, Bilateral Iliac Embolectomy.  Marland Kitchen KNEE SURGERY     Allergies  Allergen Reactions  . Codeine Itching  . Ace Inhibitors Swelling    Swelling of legs   Prior to Admission medications   Medication Sig Start Date End Date Taking? Authorizing Provider  allopurinol (ZYLOPRIM) 300 MG tablet Take 1 tablet (300 mg total) by mouth daily. 05/29/20  Yes Posey Boyer, MD  amLODipine (NORVASC) 10 MG tablet Take 1 tablet (10 mg total) by mouth daily. 05/29/20  Yes Posey Boyer, MD  Colchicine 0.6 MG CAPS TAKE 2 CAPSULE BY MOUTH ONCE THEN 1 CAPSULE IN AN HOUR AFTER THAT 1 CAPSULE TWICE DAILY 05/29/20  Yes Posey Boyer, MD  Nebivolol HCl (BYSTOLIC) 20 MG TABS Take 1 daily (20 mg) for blood pressure 05/29/20  Yes Posey Boyer, MD  XARELTO 20 MG TABS tablet TAKE 1 TABLET BY MOUTH DAILY WITH SUPPER 06/16/20  Yes Sagardia, Ines Bloomer, MD  Multiple Vitamin (MULTIVITAMIN WITH MINERALS) TABS Take 1 tablet by mouth daily. Reported on 07/14/2016 Patient not taking: Reported on 07/24/2020    [provider]   Social History   Socioeconomic History  . Marital status: Divorced    Spouse name: Not on file  . Number of children: Not on file  . Years of education: Not on file  . Highest education level: Not on file  Occupational History  . Not on file  Tobacco Use  . Smoking status: Never Smoker  . Smokeless tobacco: Never Used  Substance and Sexual Activity  . Alcohol use: No    Alcohol/week: 0.0 standard drinks  . Drug use: No  . Sexual activity: Yes    Comment: married    Other Topics Concern  . Not on file  Social History Narrative  . Not on file   Social Determinants of Health   Financial Resource Strain:   . Difficulty of Paying Living Expenses:   Food Insecurity:   . Worried About Charity fundraiser in the Last Year:   . Arboriculturist in the Last Year:   Transportation Needs:   . Film/video editor (Medical):   Marland Kitchen Lack of Transportation (Non-Medical):   Physical Activity:   . Days of Exercise per Week:   . Minutes of Exercise per Session:   Stress:   . Feeling of Stress :   Social Connections:   . Frequency of Communication with  Friends and Family:   . Frequency of Social Gatherings with Friends and Family:   . Attends Religious Services:   . Active Member of Clubs or Organizations:   . Attends Archivist Meetings:   Marland Kitchen Marital Status:   Intimate Partner Violence:   . Fear of Current or Ex-Partner:   . Emotionally Abused:   Marland Kitchen Physically Abused:   . Sexually Abused:     Review of Systems Per HPI.   Objective:   Vitals:   07/24/20 1650 07/24/20 1724  BP: (!) 154/108 (!) 140/109  Pulse: 65   Temp: 97.9 F (36.6 C)   SpO2: 99%   Weight: (!) 232 lb (105.2 kg)   Height: 6' 1.5" (1.867 m)      Physical Exam Vitals reviewed.  Constitutional:      Appearance: He is well-developed.  HENT:     Head: Normocephalic and atraumatic.  Eyes:     Pupils: Pupils are equal, round, and reactive to light.  Neck:     Vascular: No carotid bruit or JVD.  Cardiovascular:     Rate and Rhythm: Normal rate and regular rhythm.     Heart sounds: Normal heart sounds. No murmur heard.   Pulmonary:     Effort: Pulmonary effort is normal.     Breath sounds: Normal breath sounds. No rales.  Musculoskeletal:     Left ankle: Tenderness present over the lateral malleolus (and 1st mtp. skin intact. ). Decreased range of motion.  Skin:    General: Skin is warm and dry.  Neurological:     Mental Status: He is alert and oriented to  person, place, and time.       Assessment & Plan:  Isaiah [name] is a 59 y.o. male . Essential hypertension - Plan: Basic metabolic panel  -Risks of uncontrolled hypertension discussed including worsening renal function.  Restart amlodipine, check BMP, close follow-up with ER precautions.  Acute gouty arthritis - Plan: Colchicine 0.6 MG CAPS, Uric Acid, predniSONE (DELTASONE) 20 MG tablet  -Start prednisone,, side effects discussed, hold colchicine for now.  Refill colchicine if needed for future flares.  Declined stronger pain medication than Tylenol over-the-counter at this time, he declines stronger medication for current flare.  May need allopurinol adjustment based on uric acid and renal function  Renal insufficiency - Plan: Basic metabolic panel  -As above, improved blood pressure treatment discussed.  Close follow-up with PCP.  Check BMP, and will need to monitor with use of allopurinol.  Possibly may need lower dosing.  Nephrology follow-up recommended.  (Stated urology in after visit summary, but did discuss with patient nephrology, not urology)  Meds ordered this encounter  Medications  . Colchicine 0.6 MG CAPS    Sig: TAKE 2 CAPSULE BY MOUTH ONCE THEN 1 CAPSULE IN AN HOUR AFTER THAT 1 CAPSULE TWICE DAILY    Dispense:  30 capsule    Refill:  5  . predniSONE (DELTASONE) 20 MG tablet    Sig: Take 2 tablets (40 mg total) by mouth daily with breakfast.    Dispense:  10 tablet    Refill:  0   Patient Instructions    It is very important to take your medicines for blood pressure.   Restart amlodipine daily, continue bystolic.  Follow up with Dr. Mitchel Honour in next 2 weeks.   Stop colchicine for now. I will check kidney function to decide on allopurinol dose and safety. Please call urologist for appointment.   Prednisone for  gout flair, tylenol is ok for now.  Return to the clinic or go to the nearest emergency room if any of your symptoms worsen or new symptoms  occur.    If you have lab work done today you will be contacted with your lab results within the next 2 weeks.  If you have not heard from [place] then please contact us. The fastest way to get your results is to register for My Chart.   IF you received an x-ray today, you will receive an invoice from [facility]. Please contact [facility] at [phone] with questions or concerns regarding your invoice.   IF you received labwork today, you will receive an invoice from [place]. Please contact [facility] at [phone] with questions or concerns regarding your invoice.   Our billing staff will not be able to assist you with questions regarding bills from these companies.  You will be contacted with the lab results as soon as they are available. The fastest way to get your results is to activate your My Chart account. Instructions are located on the last page of this paperwork. If you have not heard from [place] regarding the results in 2 weeks, please contact this office.          Signed, Merri Ray, MD [facility]

## 2020-07-24 NOTE — Patient Instructions (Addendum)
  It is very important to take your medicines for blood pressure.   Restart amlodipine daily, continue bystolic.  Follow up with Dr. Mitchel Honour in next 2 weeks.   Stop colchicine for now. I will check kidney function to decide on allopurinol dose and safety. Please call urologist for appointment.   Prednisone for gout flair, tylenol is ok for now.  Return to the clinic or go to the nearest emergency room if any of your symptoms worsen or new symptoms occur.    If you have lab work done today you will be contacted with your lab results within the next 2 weeks.  If you have not heard from [place] then please contact us. The fastest way to get your results is to register for My Chart.   IF you received an x-ray today, you will receive an invoice from [facility]. Please contact [facility] at [phone] with questions or concerns regarding your invoice.   IF you received labwork today, you will receive an invoice from [place]. Please contact [facility] at [phone] with questions or concerns regarding your invoice.   Our billing staff will not be able to assist you with questions regarding bills from these companies.  You will be contacted with the lab results as soon as they are available. The fastest way to get your results is to activate your My Chart account. Instructions are located on the last page of this paperwork. If you have not heard from [place] regarding the results in 2 weeks, please contact this office.

## 2020-07-25 LAB — URIC ACID: Uric Acid: 10 mg/dL — ABNORMAL HIGH (ref 3.8–8.4)

## 2020-07-25 LAB — BASIC METABOLIC PANEL
BUN/Creatinine Ratio: 11 (ref 9–20)
BUN: 28 mg/dL — ABNORMAL HIGH (ref 6–24)
CO2: 19 mmol/L — ABNORMAL LOW (ref 20–29)
Calcium: 10.3 mg/dL — ABNORMAL HIGH (ref 8.7–10.2)
Chloride: 106 mmol/L (ref 96–106)
Creatinine, Ser: 2.58 mg/dL — ABNORMAL HIGH (ref 0.76–1.27)
GFR calc Af Amer: 30 mL/min/{1.73_m2} — ABNORMAL LOW (ref >59)
GFR calc non Af Amer: 26 mL/min/{1.73_m2} — ABNORMAL LOW (ref >59)
Glucose: 99 mg/dL (ref 65–99)
Potassium: 4.1 mmol/L (ref 3.5–5.2)
Sodium: 140 mmol/L (ref 134–144)

## 2020-07-26 ENCOUNTER — Encounter: Payer: Self-pay | Admitting: Family Medicine

## 2020-07-29 ENCOUNTER — Encounter: Payer: Self-pay | Admitting: Radiology

## 2020-08-07 ENCOUNTER — Ambulatory Visit: Payer: BLUE CROSS/BLUE SHIELD | Admitting: Emergency Medicine

## 2020-08-07 ENCOUNTER — Other Ambulatory Visit: Payer: Self-pay

## 2020-08-07 ENCOUNTER — Encounter: Payer: Self-pay | Admitting: Emergency Medicine

## 2020-08-07 VITALS — BP 155/110 | HR 75 | Temp 98.0°F | Resp 16 | Ht 73.0 in | Wt 233.0 lb

## 2020-08-07 DIAGNOSIS — R7303 Prediabetes: Secondary | ICD-10-CM

## 2020-08-07 DIAGNOSIS — I10 Essential (primary) hypertension: Secondary | ICD-10-CM | POA: Diagnosis not present

## 2020-08-07 DIAGNOSIS — E785 Hyperlipidemia, unspecified: Secondary | ICD-10-CM

## 2020-08-07 DIAGNOSIS — I119 Hypertensive heart disease without heart failure: Secondary | ICD-10-CM

## 2020-08-07 DIAGNOSIS — Z7901 Long term (current) use of anticoagulants: Secondary | ICD-10-CM

## 2020-08-07 DIAGNOSIS — N1831 Chronic kidney disease, stage 3a: Secondary | ICD-10-CM

## 2020-08-07 DIAGNOSIS — I482 Chronic atrial fibrillation, unspecified: Secondary | ICD-10-CM | POA: Diagnosis not present

## 2020-08-07 DIAGNOSIS — Z8739 Personal history of other diseases of the musculoskeletal system and connective tissue: Secondary | ICD-10-CM

## 2020-08-07 NOTE — Patient Instructions (Addendum)
   If you have lab work done today you will be contacted with your lab results within the next 2 weeks.  If you have not heard from us then please contact us. The fastest way to get your results is to register for My Chart.   IF you received an x-ray today, you will receive an invoice from [place]. Please contact [facility] at [phone] with questions or concerns regarding your invoice.   IF you received labwork today, you will receive an invoice from [facility]. Please contact [facility] at [phone] with questions or concerns regarding your invoice.   Our billing staff will not be able to assist you with questions regarding bills from these companies.  You will be contacted with the lab results as soon as they are available. The fastest way to get your results is to activate your My Chart account. Instructions are located on the last page of this paperwork. If you have not heard from us regarding the results in 2 weeks, please contact this office.      Hypertension, Adult High blood pressure (hypertension) is when the force of blood pumping through the arteries is too strong. The arteries are the blood vessels that carry blood from the heart throughout the body. Hypertension forces the heart to work harder to pump blood and may cause arteries to become narrow or stiff. Untreated or uncontrolled hypertension can cause a heart attack, heart failure, a stroke, kidney disease, and other problems. A blood pressure reading consists of a higher number over a lower number. Ideally, your blood pressure should be below 120/80. The first ("top") number is called the systolic pressure. It is a measure of the pressure in your arteries as your heart beats. The second ("bottom") number is called the diastolic pressure. It is a measure of the pressure in your arteries as the heart relaxes. What are the causes? The exact cause of this condition is not known. There are some conditions  that result in or are related to high blood pressure. What increases the risk? Some risk factors for high blood pressure are under your control. The following factors may make you more likely to develop this condition:  Smoking.  Having type 2 diabetes mellitus, high cholesterol, or both.  Not getting enough exercise or physical activity.  Being overweight.  Having too much fat, sugar, calories, or salt (sodium) in your diet.  Drinking too much alcohol. Some risk factors for high blood pressure may be difficult or impossible to change. Some of these factors include:  Having chronic kidney disease.  Having a family history of high blood pressure.  Age. Risk increases with age.  Race. You may be at higher risk if you are African American.  Gender. Men are at higher risk than women before age 45. After age 65, women are at higher risk than men.  Having obstructive sleep apnea.  Stress. What are the signs or symptoms? High blood pressure may not cause symptoms. Very high blood pressure (hypertensive crisis) may cause:  Headache.  Anxiety.  Shortness of breath.  Nosebleed.  Nausea and vomiting.  Vision changes.  Severe chest pain.  Seizures. How is this diagnosed? This condition is diagnosed by measuring your blood pressure while you are seated, with your arm resting on a flat surface, your legs uncrossed, and your feet flat on the floor. The cuff of the blood pressure monitor will be placed directly against the skin of your upper arm at the level of your   heart. It should be measured at least twice using the same arm. Certain conditions can cause a difference in blood pressure between your right and left arms. Certain factors can cause blood pressure readings to be lower or higher than normal for a short period of time:  When your blood pressure is higher when you are in a health care provider's office than when you are at home, this is called white coat hypertension.  Most people with this condition do not need medicines.  When your blood pressure is higher at home than when you are in a health care provider's office, this is called masked hypertension. Most people with this condition may need medicines to control blood pressure. If you have a high blood pressure reading during one visit or you have normal blood pressure with other risk factors, you may be asked to:  Return on a different day to have your blood pressure checked again.  Monitor your blood pressure at home for 1 week or longer. If you are diagnosed with hypertension, you may have other blood or imaging tests to help your health care provider understand your overall risk for other conditions. How is this treated? This condition is treated by making healthy lifestyle changes, such as eating healthy foods, exercising more, and reducing your alcohol intake. Your health care provider may prescribe medicine if lifestyle changes are not enough to get your blood pressure under control, and if:  Your systolic blood pressure is above 130.  Your diastolic blood pressure is above 80. Your personal target blood pressure may vary depending on your medical conditions, your age, and other factors. Follow these instructions at home: Eating and drinking   Eat a diet that is high in fiber and potassium, and low in sodium, added sugar, and fat. An example eating plan is called the DASH (Dietary Approaches to Stop Hypertension) diet. To eat this way: ? Eat plenty of fresh fruits and vegetables. Try to fill one half of your plate at each meal with fruits and vegetables. ? Eat whole grains, such as whole-wheat pasta, brown rice, or whole-grain bread. Fill about one fourth of your plate with whole grains. ? Eat or drink low-fat dairy products, such as skim milk or low-fat yogurt. ? Avoid fatty cuts of meat, processed or cured meats, and poultry with skin. Fill about one fourth of your plate with lean proteins, such  as fish, chicken without skin, beans, eggs, or tofu. ? Avoid pre-made and processed foods. These tend to be higher in sodium, added sugar, and fat.  Reduce your daily sodium intake. Most people with hypertension should eat less than 1,500 mg of sodium a day.  Do not drink alcohol if: ? Your health care provider tells you not to drink. ? You are pregnant, may be pregnant, or are planning to become pregnant.  If you drink alcohol: ? Limit how much you use to:  0-1 drink a day for women.  0-2 drinks a day for men. ? Be aware of how much alcohol is in your drink. In the [place] one drink equals one 12 oz bottle of beer (355 mL), one 5 oz glass of wine (148 mL), or one 1 oz glass of hard liquor (44 mL). Lifestyle   Work with your health care provider to maintain a healthy body weight or to lose weight. Ask what an ideal weight is for you.  Get at least 30 minutes of exercise most days of the week. Activities may include walking, swimming,   or biking.  Include exercise to strengthen your muscles (resistance exercise), such as Pilates or lifting weights, as part of your weekly exercise routine. Try to do these types of exercises for 30 minutes at least 3 days a week.  Do not use any products that contain nicotine or tobacco, such as cigarettes, e-cigarettes, and chewing tobacco. If you need help quitting, ask your health care provider.  Monitor your blood pressure at home as told by your health care provider.  Keep all follow-up visits as told by your health care provider. This is important. Medicines  Take over-the-counter and prescription medicines only as told by your health care provider. Follow directions carefully. Blood pressure medicines must be taken as prescribed.  Do not skip doses of blood pressure medicine. Doing this puts you at risk for problems and can make the medicine less effective.  Ask your health care provider about side effects or reactions to medicines that you  should watch for. Contact a health care provider if you:  Think you are having a reaction to a medicine you are taking.  Have headaches that keep coming back (recurring).  Feel dizzy.  Have swelling in your ankles.  Have trouble with your vision. Get help right away if you:  Develop a severe headache or confusion.  Have unusual weakness or numbness.  Feel faint.  Have severe pain in your chest or abdomen.  Vomit repeatedly.  Have trouble breathing. Summary  Hypertension is when the force of blood pumping through your arteries is too strong. If this condition is not controlled, it may put you at risk for serious complications.  Your personal target blood pressure may vary depending on your medical conditions, your age, and other factors. For most people, a normal blood pressure is less than 120/80.  Hypertension is treated with lifestyle changes, medicines, or a combination of both. Lifestyle changes include losing weight, eating a healthy, low-sodium diet, exercising more, and limiting alcohol. This information is not intended to replace advice given to you by your health care provider. Make sure you discuss any questions you have with your health care provider. Document Revised: 08/23/2018 Document Reviewed: 08/23/2018 Elsevier Patient Education  2020 [employer]  

## 2020-08-07 NOTE — Progress Notes (Signed)
Isaiah [name] 59 y.o.   Chief Complaint  Patient presents with  . Gout    follow up 2 weeks    HISTORY OF PRESENT ILLNESS: This is a 59 y.o. male with multiple chronic medical problems.  Here today for gout flareup follow-up 2 weeks ago.  Doing well.  Has no complaints or medical concerns today. Medical records and office visit notes reviewed. Recent blood work done 2 weeks ago showed stable kidney function test and elevated uric acid at 10.0.  Otherwise unremarkable. Works third shift.  Long hours.  Overworked and not sleeping well.  Compliant with blood pressure medications. Presently taking amlodipine and nebivolol for blood pressure.  Has chronic atrial fibrillation on lifetime Xarelto. Not vaccinated against Covid.  Not interested in getting vaccine.  No natural immunity either.  HPI   Prior to Admission medications   Medication Sig Start Date End Date Taking? Authorizing Provider  allopurinol (ZYLOPRIM) 300 MG tablet Take 1 tablet (300 mg total) by mouth daily. 05/29/20  Yes Posey Boyer, MD  amLODipine (NORVASC) 10 MG tablet Take 1 tablet (10 mg total) by mouth daily. 05/29/20  Yes Posey Boyer, MD  Colchicine 0.6 MG CAPS TAKE 2 CAPSULE BY MOUTH ONCE THEN 1 CAPSULE IN AN HOUR AFTER THAT 1 CAPSULE TWICE DAILY 07/24/20  Yes Wendie Agreste, MD  Nebivolol HCl (BYSTOLIC) 20 MG TABS Take 1 daily (20 mg) for blood pressure 05/29/20  Yes Posey Boyer, MD  XARELTO 20 MG TABS tablet TAKE 1 TABLET BY MOUTH DAILY WITH SUPPER 06/16/20  Yes Margarette Vannatter, Ines Bloomer, MD  Multiple Vitamin (MULTIVITAMIN WITH MINERALS) TABS Take 1 tablet by mouth daily. Reported on 07/14/2016 Patient not taking: Reported on 08/07/2020    [provider]  predniSONE (DELTASONE) 20 MG tablet Take 2 tablets (40 mg total) by mouth daily with breakfast. Patient not taking: Reported on 08/07/2020 07/24/20   Wendie Agreste, MD    Allergies  Allergen Reactions  . Codeine Itching  . Ace Inhibitors  Swelling    Swelling of legs    Patient Active Problem List   Diagnosis Date Noted  . Hypertensive heart disease   . Dyslipidemia 02/02/2015  . Obesity (BMI 30-39.9) 09/10/2013  . Gout 03/09/2013  . Terminal aortic occlusion (HCC) 01/24/2013  . Atrial fibrillation, permanent ([place]) 01/24/2013  . Essential hypertension 01/24/2013  . Erectile dysfunction 01/24/2013  . CAD (coronary artery disease), non obstructive by cardiac cath 2009 01/24/2013    Past Medical History:  Diagnosis Date  . Atrial fibrillation, permanent ([place]) 01/24/2013   chronic,previuosly refuses coumadin,now on xarelto  . CKD (chronic kidney disease) stage 3, GFR 30-59 ml/min 01/24/2013   Baseline creatinine roughly 1.7-1.8.  Marland Kitchen Erectile dysfunction   . Extremity ischemia, critical bil lower ext. 01/24/2013   Status post bilateral common femoral, profunda femoral and superficial femoral arterial embolectomy  . Gout   . H/O right and left cardiac catheterization November 2009   30-40% RCA disease, cardiac output Fick 6.3, thermodilution 5.3.  Normal artery pressures.  Marland Kitchen Heart murmur   . History of Non-ischemic cardiomyopathy 2009 - 2014   EF previously as low as 35-40% in 2009, up to 40 and 45% by 2012.  Repeat echo January 2014: Echo EF 55-60% mild LVH, mild to moderate left atrial enlargement, mild right atrial enlargement.  . History of TIA (transient ischemic attack) 2005   Carotid Dopplers January 2014 negative for significant stenosis.  . Hypertension   . Hypertensive  heart disease   . OSA on CPAP    use DME- Choice titration study was on 02/21/13  . Pinched nerve in neck   . Terminal aortic occlusion ([place]) 01/24/2013   Status post embolectomy    Past Surgical History:  Procedure Laterality Date  . CARDIAC CATHETERIZATION  Nov 2009   right and left  heart cath ,cardiac output 6.3 by FICK AND 5.34 by thermal  diluation.norm RV pressures and nonobstructive cor disease. no shunt.some intramyocardial  bridgingof the LAD  . CAROTID DOPPLERS  Nov 2009   done for TIA which were normal  . DOPPLER ECHOCARDIOGRAPHY  02/2011; 12/2012   a) EF40-45%; LA mod to severe dilated ;; b) EF 55-60%, mild LVH, Mild-Mod LA dilation  . EMBOLECTOMY  01/23/2013   Procedure: EMBOLECTOMY;  Surgeon: Serafina Mitchell, MD;  Location: [facility] OR;  Service: Vascular;  Laterality: Bilateral;  Bilateral femoral Embolectomy, Bilateral Iliac Embolectomy.  Marland Kitchen KNEE SURGERY      Social History   Socioeconomic History  . Marital status: Divorced    Spouse name: Not on file  . Number of children: Not on file  . Years of education: Not on file  . Highest education level: Not on file  Occupational History  . Not on file  Tobacco Use  . Smoking status: Never Smoker  . Smokeless tobacco: Never Used  Substance and Sexual Activity  . Alcohol use: No    Alcohol/week: 0.0 standard drinks  . Drug use: No  . Sexual activity: Yes    Comment: married  Other Topics Concern  . Not on file  Social History Narrative  . Not on file   Social Determinants of Health   Financial Resource Strain:   . Difficulty of Paying Living Expenses:   Food Insecurity:   . Worried About Charity fundraiser in the Last Year:   . Arboriculturist in the Last Year:   Transportation Needs:   . Film/video editor (Medical):   Marland Kitchen Lack of Transportation (Non-Medical):   Physical Activity:   . Days of Exercise per Week:   . Minutes of Exercise per Session:   Stress:   . Feeling of Stress :   Social Connections:   . Frequency of Communication with Friends and Family:   . Frequency of Social Gatherings with Friends and Family:   . Attends Religious Services:   . Active Member of Clubs or Organizations:   . Attends Archivist Meetings:   Marland Kitchen Marital Status:   Intimate Partner Violence:   . Fear of Current or Ex-Partner:   . Emotionally Abused:   Marland Kitchen Physically Abused:   . Sexually Abused:     Family History  Problem Relation Age of  Onset  . Hypertension Mother   . Stroke Mother   . Hypertension Father   . Stroke Father   . Hypertension Sister   . Hypertension Brother   . Hypertension Sister      Review of Systems  Constitutional: Negative.  Negative for chills and fever.  HENT: Negative.  Negative for congestion and sore throat.   Respiratory: Negative.  Negative for cough and shortness of breath.   Cardiovascular: Positive for palpitations. Negative for chest pain and leg swelling.  Gastrointestinal: Negative.  Negative for abdominal pain, blood in stool, diarrhea, nausea and vomiting.  Genitourinary: Negative.  Negative for dysuria and hematuria.  Skin: Negative.  Negative for rash.  Neurological: Negative.  Negative for dizziness and headaches.  All other systems reviewed and are negative.      Physical Exam Vitals reviewed.  Constitutional:      Appearance: Normal appearance.  HENT:     Head: Normocephalic.  Eyes:     Extraocular Movements: Extraocular movements intact.     Conjunctiva/sclera: Conjunctivae normal.     Pupils: Pupils are equal, round, and reactive to light.  Cardiovascular:     Rate and Rhythm: Normal rate. Rhythm irregular.     Pulses: Normal pulses.     Heart sounds: Normal heart sounds.  Pulmonary:     Effort: Pulmonary effort is normal.     Breath sounds: Normal breath sounds.  Musculoskeletal:        General: Normal range of motion.     Cervical back: Normal range of motion and neck supple.  Skin:    General: Skin is warm and dry.     Capillary Refill: Capillary refill takes less than 2 seconds.  Neurological:     General: No focal deficit present.     Mental Status: He is alert and oriented to person, place, and time.  Psychiatric:        Mood and Affect: Mood normal.        Behavior: Behavior normal.      ASSESSMENT & PLAN: Clinically stable.  No medical concerns identified during this visit.  Continue present medications.  No changes. Terreon was seen today  for gout.  Diagnoses and all orders for this visit:  Essential hypertension  Stage 3a chronic kidney disease  Chronic atrial fibrillation (HCC)  Dyslipidemia  Hypertensive heart disease without heart failure  Current use of long term anticoagulation  Prediabetes  History of gout    Patient Instructions       If you have lab work done today you will be contacted with your lab results within the next 2 weeks.  If you have not heard from [place] then please contact us. The fastest way to get your results is to register for My Chart.   IF you received an x-ray today, you will receive an invoice from [facility]. Please contact [facility]iology at [phone] with questions or concerns regarding your invoice.   IF you received labwork today, you will receive an invoice from [facility]. Please contact [facility] at [phone] with questions or concerns regarding your invoice.   Our billing staff will not be able to assist you with questions regarding bills from these companies.  You will be contacted with the lab results as soon as they are available. The fastest way to get your results is to activate your My Chart account. Instructions are located on the last page of this paperwork. If you have not heard from [place] regarding the results in 2 weeks, please contact this office.     Hypertension, Adult High blood pressure (hypertension) is when the force of blood pumping through the arteries is too strong. The arteries are the blood vessels that carry blood from the heart throughout the body. Hypertension forces the heart to work harder to pump blood and may cause arteries to become narrow or stiff. Untreated or uncontrolled hypertension can cause a heart attack, heart failure, a stroke, kidney disease, and other problems. A blood pressure reading consists of a higher number over a lower number. Ideally, your blood pressure should be below 120/80. The first ("top") number is  called the systolic pressure. It is a measure of the pressure in your arteries as your heart beats. The second ("  bottom") number is called the diastolic pressure. It is a measure of the pressure in your arteries as the heart relaxes. What are the causes? The exact cause of this condition is not known. There are some conditions that result in or are related to high blood pressure. What increases the risk? Some risk factors for high blood pressure are under your control. The following factors may make you more likely to develop this condition:  Smoking.  Having type 2 diabetes mellitus, high cholesterol, or both.  Not getting enough exercise or physical activity.  Being overweight.  Having too much fat, sugar, calories, or salt (sodium) in your diet.  Drinking too much alcohol. Some risk factors for high blood pressure may be difficult or impossible to change. Some of these factors include:  Having chronic kidney disease.  Having a family history of high blood pressure.  Age. Risk increases with age.  Race. You may be at higher risk if you are African American.  Gender. Men are at higher risk than women before age 70. After age 3, women are at higher risk than men.  Having obstructive sleep apnea.  Stress. What are the signs or symptoms? High blood pressure may not cause symptoms. Very high blood pressure (hypertensive crisis) may cause:  Headache.  Anxiety.  Shortness of breath.  Nosebleed.  Nausea and vomiting.  Vision changes.  Severe chest pain.  Seizures. How is this diagnosed? This condition is diagnosed by measuring your blood pressure while you are seated, with your arm resting on a flat surface, your legs uncrossed, and your feet flat on the floor. The cuff of the blood pressure monitor will be placed directly against the skin of your upper arm at the level of your heart. It should be measured at least twice using the same arm. Certain conditions can cause a  difference in blood pressure between your right and left arms. Certain factors can cause blood pressure readings to be lower or higher than normal for a short period of time:  When your blood pressure is higher when you are in a health care provider's office than when you are at home, this is called white coat hypertension. Most people with this condition do not need medicines.  When your blood pressure is higher at home than when you are in a health care provider's office, this is called masked hypertension. Most people with this condition may need medicines to control blood pressure. If you have a high blood pressure reading during one visit or you have normal blood pressure with other risk factors, you may be asked to:  Return on a different day to have your blood pressure checked again.  Monitor your blood pressure at home for 1 week or longer. If you are diagnosed with hypertension, you may have other blood or imaging tests to help your health care provider understand your overall risk for other conditions. How is this treated? This condition is treated by making healthy lifestyle changes, such as eating healthy foods, exercising more, and reducing your alcohol intake. Your health care provider may prescribe medicine if lifestyle changes are not enough to get your blood pressure under control, and if:  Your systolic blood pressure is above 130.  Your diastolic blood pressure is above 80. Your personal target blood pressure may vary depending on your medical conditions, your age, and other factors. Follow these instructions at home: Eating and drinking   Eat a diet that is high in fiber and potassium, and low  in sodium, added sugar, and fat. An example eating plan is called the DASH (Dietary Approaches to Stop Hypertension) diet. To eat this way: ? Eat plenty of fresh fruits and vegetables. Try to fill one half of your plate at each meal with fruits and vegetables. ? Eat whole grains,  such as whole-wheat pasta, brown rice, or whole-grain bread. Fill about one fourth of your plate with whole grains. ? Eat or drink low-fat dairy products, such as skim milk or low-fat yogurt. ? Avoid fatty cuts of meat, processed or cured meats, and poultry with skin. Fill about one fourth of your plate with lean proteins, such as fish, chicken without skin, beans, eggs, or tofu. ? Avoid pre-made and processed foods. These tend to be higher in sodium, added sugar, and fat.  Reduce your daily sodium intake. Most people with hypertension should eat less than 1,500 mg of sodium a day.  Do not drink alcohol if: ? Your health care provider tells you not to drink. ? You are pregnant, may be pregnant, or are planning to become pregnant.  If you drink alcohol: ? Limit how much you use to:  0-1 drink a day for women.  0-2 drinks a day for men. ? Be aware of how much alcohol is in your drink. In the [place] one drink equals one 12 oz bottle of beer (355 mL), one 5 oz glass of wine (148 mL), or one 1 oz glass of hard liquor (44 mL). Lifestyle   Work with your health care provider to maintain a healthy body weight or to lose weight. Ask what an ideal weight is for you.  Get at least 30 minutes of exercise most days of the week. Activities may include walking, swimming, or biking.  Include exercise to strengthen your muscles (resistance exercise), such as Pilates or lifting weights, as part of your weekly exercise routine. Try to do these types of exercises for 30 minutes at least 3 days a week.  Do not use any products that contain nicotine or tobacco, such as cigarettes, e-cigarettes, and chewing tobacco. If you need help quitting, ask your health care provider.  Monitor your blood pressure at home as told by your health care provider.  Keep all follow-up visits as told by your health care provider. This is important. Medicines  Take over-the-counter and prescription medicines only as told by  your health care provider. Follow directions carefully. Blood pressure medicines must be taken as prescribed.  Do not skip doses of blood pressure medicine. Doing this puts you at risk for problems and can make the medicine less effective.  Ask your health care provider about side effects or reactions to medicines that you should watch for. Contact a health care provider if you:  Think you are having a reaction to a medicine you are taking.  Have headaches that keep coming back (recurring).  Feel dizzy.  Have swelling in your ankles.  Have trouble with your vision. Get help right away if you:  Develop a severe headache or confusion.  Have unusual weakness or numbness.  Feel faint.  Have severe pain in your chest or abdomen.  Vomit repeatedly.  Have trouble breathing. Summary  Hypertension is when the force of blood pumping through your arteries is too strong. If this condition is not controlled, it may put you at risk for serious complications.  Your personal target blood pressure may vary depending on your medical conditions, your age, and other factors. For most people, a normal  blood pressure is less than 120/80.  Hypertension is treated with lifestyle changes, medicines, or a combination of both. Lifestyle changes include losing weight, eating a healthy, low-sodium diet, exercising more, and limiting alcohol. This information is not intended to replace advice given to you by your health care provider. Make sure you discuss any questions you have with your health care provider. Document Revised: 08/23/2018 Document Reviewed: 08/23/2018 Elsevier Patient Education  2020 Elsevier Inc.      Agustina Caroli, MD [facility]

## 2020-08-12 ENCOUNTER — Other Ambulatory Visit: Payer: Self-pay | Admitting: Emergency Medicine

## 2020-09-15 ENCOUNTER — Other Ambulatory Visit: Payer: Self-pay | Admitting: Emergency Medicine

## 2020-12-05 ENCOUNTER — Encounter: Payer: Self-pay | Admitting: Family Medicine

## 2020-12-05 ENCOUNTER — Other Ambulatory Visit: Payer: Self-pay | Admitting: Family Medicine

## 2020-12-05 ENCOUNTER — Ambulatory Visit: Payer: BLUE CROSS/BLUE SHIELD | Admitting: Family Medicine

## 2020-12-05 ENCOUNTER — Other Ambulatory Visit: Payer: Self-pay

## 2020-12-05 VITALS — BP 146/60 | HR 88 | Temp 97.8°F | Ht 73.0 in | Wt 241.0 lb

## 2020-12-05 DIAGNOSIS — I10 Essential (primary) hypertension: Secondary | ICD-10-CM

## 2020-12-05 DIAGNOSIS — M109 Gout, unspecified: Secondary | ICD-10-CM

## 2020-12-05 DIAGNOSIS — N1832 Chronic kidney disease, stage 3b: Secondary | ICD-10-CM | POA: Diagnosis not present

## 2020-12-05 DIAGNOSIS — N184 Chronic kidney disease, stage 4 (severe): Secondary | ICD-10-CM | POA: Insufficient documentation

## 2020-12-05 MED ORDER — PREDNISONE 20 MG PO TABS: ORAL_TABLET | ORAL | 0 refills | Status: DC

## 2020-12-05 MED ORDER — COLCHICINE 0.6 MG PO CAPS: ORAL_CAPSULE | ORAL | 5 refills | Status: DC

## 2020-12-05 NOTE — Progress Notes (Signed)
Patient ID: Isaiah [name], male    DOB: Dec 27, 1961  Age: 59 y.o. MRN: [id number]  Chief Complaint  Patient presents with  . Gout    Fair up going on 1 week.  Left feet swelling and ankle and big toe and some on right big toe     Subjective:   Patient has a history of gout.  Last to 3 days he has been having a flare.  His left heel, lateral foot, and great toe are very painful and swollen.  The right lateral foot also is very painful and swollen.  He has not taken allopurinol regularly.  He does try and watch what he eats.  He has had to be treated with colchicine and prednisone in the past.  He does have some renal insufficiency and is seen a kidney specialist, but has not got an appointment right now has not seen them for a while.  Current allergies, medications, problem list, past/family and social histories reviewed.  Objective:  BP (!) 146/60   Pulse 88   Temp 97.8 F (36.6 C) (Temporal)   Ht 6\' 1"  (1.854 m)   Wt 241 lb (109.3 kg)   SpO2 99%   BMI 31.80 kg/m  Very tender feet, the left 1 in the heel, lateral fifth proximal metatarsal area and right great metatarsal phalangeal joint  Assessment & Plan:   Assessment: 1. Acute gouty arthritis   2. Stage 3b chronic kidney disease ([place])       Plan: See instructions.  Patient understands.  We will try and get him on an increased dose of allopurinol and see if we can get the level down.  However need to be cautious about all of his medicines because of his kidney function.  Orders Placed This Encounter  Procedures  . Comprehensive metabolic panel  . Uric acid    Meds ordered this encounter  Medications  . predniSONE (DELTASONE) 20 MG tablet    Sig: Take 3 pills initially today, then 2 tomorrow, then 1 the next day for gout.    Dispense:  6 tablet    Refill:  0  . Colchicine 0.6 MG CAPS    Sig: TAKE 2 CAPSULE BY MOUTH ONCE THEN 1 CAPSULE IN AN HOUR AFTER THAT 1 CAPSULE TWICE DAILY    Dispense:  30 capsule    Refill:   5         Patient Instructions     Take prednisone 20 mg 3 pills today, 2 tomorrow, and 1 the next day.  Take colchicine 0.6 mg 2 tablets initially, then 1 twice daily as needed for gout  I will let you know what to do with the allopurinol dose after we see your labs  I recommend that you go online and look up foods known to cause gout and reviewed again to see if there is anything that you can easily eliminate that might help.  You cannot eliminate everything, but you can find a few key foods that you might be eating that may be hurting you.  Your kidney function is a concern.  I am repeating the labs today but I do recommend that you make a follow-up visit with your kidney specialist also.  Continue to drink a lot of water to keep yourself flushed through well.  If you have lab work done today you will be contacted with your lab results within the next 2 weeks.  If you have not heard from [place] then please contact us.  The fastest way to get your results is to register for My Chart.   IF you received an x-ray today, you will receive an invoice from [facility]. Please contact [facility] at [phone] with questions or concerns regarding your invoice.   IF you received labwork today, you will receive an invoice from [place]. Please contact [facility] at [phone] with questions or concerns regarding your invoice.   Our billing staff will not be able to assist you with questions regarding bills from these companies.  You will be contacted with the lab results as soon as they are available. The fastest way to get your results is to activate your My Chart account. Instructions are located on the last page of this paperwork. If you have not heard from [place] regarding the results in 2 weeks, please contact this office.         Return in about 3 months (around 03/05/2021), or [place], for Follow-up on uric acid and gout and kidney function.   Ruben Reason, MD  12/05/2020

## 2020-12-05 NOTE — Patient Instructions (Addendum)
   Take prednisone 20 mg 3 pills today, 2 tomorrow, and 1 the next day.  Take colchicine 0.6 mg 2 tablets initially, then 1 twice daily as needed for gout  I will let you know what to do with the allopurinol dose after we see your labs  I recommend that you go online and look up foods known to cause gout and reviewed again to see if there is anything that you can easily eliminate that might help.  You cannot eliminate everything, but you can find a few key foods that you might be eating that may be hurting you.  Your kidney function is a concern.  I am repeating the labs today but I do recommend that you make a follow-up visit with your kidney specialist also.  Continue to drink a lot of water to keep yourself flushed through well.  If you have lab work done today you will be contacted with your lab results within the next 2 weeks.  If you have not heard from [place] then please contact us. The fastest way to get your results is to register for My Chart.   IF you received an x-ray today, you will receive an invoice from [facility]. Please contact [facility] at [phone] with questions or concerns regarding your invoice.   IF you received labwork today, you will receive an invoice from [place]. Please contact [facility] at [phone] with questions or concerns regarding your invoice.   Our billing staff will not be able to assist you with questions regarding bills from these companies.  You will be contacted with the lab results as soon as they are available. The fastest way to get your results is to activate your My Chart account. Instructions are located on the last page of this paperwork. If you have not heard from [place] regarding the results in 2 weeks, please contact this office.

## 2020-12-06 LAB — COMPREHENSIVE METABOLIC PANEL
ALT: 26 IU/L (ref 0–44)
AST: 33 IU/L (ref 0–40)
Albumin/Globulin Ratio: 1.4 (ref 1.2–2.2)
Albumin: 4.2 g/dL (ref 3.8–4.9)
Alkaline Phosphatase: 129 IU/L — ABNORMAL HIGH (ref 44–121)
BUN/Creatinine Ratio: 9 (ref 9–20)
BUN: 25 mg/dL — ABNORMAL HIGH (ref 6–24)
Bilirubin Total: 0.4 mg/dL (ref 0.0–1.2)
CO2: 22 mmol/L (ref 20–29)
Calcium: 10.6 mg/dL — ABNORMAL HIGH (ref 8.7–10.2)
Chloride: 104 mmol/L (ref 96–106)
Creatinine, Ser: 2.64 mg/dL — ABNORMAL HIGH (ref 0.76–1.27)
GFR calc Af Amer: 30 mL/min/{1.73_m2} — ABNORMAL LOW (ref >59)
GFR calc non Af Amer: 26 mL/min/{1.73_m2} — ABNORMAL LOW (ref >59)
Globulin, Total: 3 g/dL (ref 1.5–4.5)
Glucose: 86 mg/dL (ref 65–99)
Potassium: 3.6 mmol/L (ref 3.5–5.2)
Sodium: 142 mmol/L (ref 134–144)
Total Protein: 7.2 g/dL (ref 6.0–8.5)

## 2020-12-06 LAB — URIC ACID: Uric Acid: 9.7 mg/dL — ABNORMAL HIGH (ref 3.8–8.4)

## 2020-12-08 ENCOUNTER — Other Ambulatory Visit: Payer: Self-pay | Admitting: Family Medicine

## 2020-12-08 DIAGNOSIS — I10 Essential (primary) hypertension: Secondary | ICD-10-CM

## 2020-12-16 ENCOUNTER — Other Ambulatory Visit: Payer: Self-pay | Admitting: *Deleted

## 2020-12-19 ENCOUNTER — Other Ambulatory Visit: Payer: Self-pay | Admitting: Emergency Medicine

## 2020-12-21 NOTE — Telephone Encounter (Signed)
Requested Prescriptions  Pending Prescriptions Disp Refills   XARELTO 20 MG TABS tablet [Pharmacy Med Name: Alveda Reasons 20MG  TABLETS] 90 tablet 0    Sig: TAKE 1 TABLET BY MOUTH DAILY WITH SUPPER     Hematology: Anticoagulants - rivaroxaban Failed - 12/19/2020 10:06 AM      Failed - Cr in normal range and within 360 days    Creat  Date Value Ref Range Status  07/14/2016 2.01 (H) 0.70 - 1.33 mg/dL Final    Comment:      For patients > or = 58 years of age: The upper reference limit for Creatinine is approximately 13% higher for people identified as African-American.      Creatinine, Ser  Date Value Ref Range Status  12/05/2020 2.64 (H) 0.76 - 1.27 mg/dL Final         Failed - HCT in normal range and within 360 days    HCT, POC  Date Value Ref Range Status  05/23/2020 44.1 (A) 29 - 41 % Final   Hematocrit  Date Value Ref Range Status  01/16/2019 41.3 37.5 - 51.0 % Final         Passed - ALT in normal range and within 180 days    ALT  Date Value Ref Range Status  12/05/2020 26 0 - 44 IU/L Final         Passed - AST in normal range and within 180 days    AST  Date Value Ref Range Status  12/05/2020 33 0 - 40 IU/L Final         Passed - HGB in normal range and within 360 days    Hemoglobin  Date Value Ref Range Status  05/23/2020 14.2 11 - 14.6 g/dL Final  01/16/2019 14.1 13.0 - 17.7 g/dL Final         Passed - PLT in normal range and within 360 days    Platelets  Date Value Ref Range Status  01/16/2019 150 150 - 450 x10E3/uL Final   Platelet Count, POC  Date Value Ref Range Status  05/23/2020 158 142 - 424 K/uL Final         Passed - Valid encounter within last 12 months    Recent Outpatient Visits          2 weeks ago Acute gouty arthritis   Primary Care at [facility], Fenton Malling, MD   4 months ago Essential hypertension   Primary Care at [facility], Ines Bloomer, MD   5 months ago Essential hypertension   Primary Care at [facility], Ranell Patrick,  MD   6 months ago Renal insufficiency   Primary Care at [facility], Fenton Malling, MD   7 months ago Pain of right lower extremity   Primary Care at [facility], Fenton Malling, MD      Future Appointments            In 2 months Sagardia, Ines Bloomer, MD Primary Care at [place], [facility]

## 2020-12-31 ENCOUNTER — Other Ambulatory Visit: Payer: Self-pay

## 2020-12-31 ENCOUNTER — Encounter: Payer: Self-pay | Admitting: Emergency Medicine

## 2020-12-31 ENCOUNTER — Telehealth (INDEPENDENT_AMBULATORY_CARE_PROVIDER_SITE_OTHER): Payer: BLUE CROSS/BLUE SHIELD | Admitting: Emergency Medicine

## 2020-12-31 VITALS — Temp 100.0°F | Ht 73.5 in | Wt 239.0 lb

## 2020-12-31 DIAGNOSIS — Z20822 Contact with and (suspected) exposure to covid-19: Secondary | ICD-10-CM | POA: Diagnosis not present

## 2020-12-31 DIAGNOSIS — B349 Viral infection, unspecified: Secondary | ICD-10-CM | POA: Diagnosis not present

## 2020-12-31 NOTE — Patient Instructions (Signed)
° ° ° °  If you have lab work done today you will be contacted with your lab results within the next 2 weeks.  If you have not heard from us then please contact us. The fastest way to get your results is to register for My Chart. ° ° °IF you received an x-ray today, you will receive an invoice from [facility]. Please contact [facility] at [phone] with questions or concerns regarding your invoice.  ° °IF you received labwork today, you will receive an invoice from [facility]. Please contact [facility] at [phone] with questions or concerns regarding your invoice.  ° °Our billing staff will not be able to assist you with questions regarding bills from these companies. ° °You will be contacted with the lab results as soon as they are available. The fastest way to get your results is to activate your My Chart account. Instructions are located on the last page of this paperwork. If you have not heard from us regarding the results in 2 weeks, please contact this office. °  ° ° ° °

## 2020-12-31 NOTE — Progress Notes (Signed)
Telemedicine Encounter- SOAP NOTE Established Patient Patient: Home  Provider: Office     This telephone encounter was conducted with the patient's (or proxy's) verbal consent via audio telecommunications: yes/no: Yes Patient was instructed to have this encounter in a suitably private space; and to only have persons present to whom they give permission to participate. In addition, patient identity was confirmed by use of name plus two identifiers (DOB and address).  I discussed the limitations, risks, security and privacy concerns of performing an evaluation and management service by telephone and the availability of in person appointments. I also discussed with the patient that there may be a patient responsible charge related to this service. The patient expressed understanding and agreed to proceed.  I spent a total of TIME; 0 MIN TO 60 MIN: 20 minutes talking with the patient or their proxy.  Chief Complaint  Patient presents with  . Fatigue    Fever at night, Weakness, dizziness when stand up, middle of back hurt to the touch, and chills every now and then x3 days    Subjective   Isaiah [name] is a 60 y.o. male established patient. Telephone visit today complaining of fever, chills, backache, weakness, dizziness, and decreased appetite that started 3 days ago.  Slowly getting better. No other complaints or medical concerns today. Not vaccinated against Covid.  No natural immunity.  HPI   Patient Active Problem List   Diagnosis Date Noted  . Stage 3b chronic kidney disease ([place]) 12/05/2020  . Hypertensive heart disease   . Dyslipidemia 02/02/2015  . Obesity (BMI 30-39.9) 09/10/2013  . Acute gouty arthritis 03/09/2013  . Terminal aortic occlusion (H[facility]) 01/24/2013  . Atrial fibrillation, permanent ([place]) 01/24/2013  . Essential hypertension 01/24/2013  . Erectile dysfunction 01/24/2013  . CAD (coronary artery disease), non obstructive by cardiac cath 2009 01/24/2013     Past Medical History:  Diagnosis Date  . Atrial fibrillation, permanent ([place]) 01/24/2013   chronic,previuosly refuses coumadin,now on xarelto  . CKD (chronic kidney disease) stage 3, GFR 30-59 ml/min (HCC) 01/24/2013   Baseline creatinine roughly 1.7-1.8.  Marland Kitchen Erectile dysfunction   . Extremity ischemia, critical bil lower ext. 01/24/2013   Status post bilateral common femoral, profunda femoral and superficial femoral arterial embolectomy  . Gout   . H/O right and left cardiac catheterization November 2009   30-40% RCA disease, cardiac output Fick 6.3, thermodilution 5.3.  Normal artery pressures.  Marland Kitchen Heart murmur   . History of Non-ischemic cardiomyopathy 2009 - 2014   EF previously as low as 35-40% in 2009, up to 40 and 45% by 2012.  Repeat echo January 2014: Echo EF 55-60% mild LVH, mild to moderate left atrial enlargement, mild right atrial enlargement.  . History of TIA (transient ischemic attack) 2005   Carotid Dopplers January 2014 negative for significant stenosis.  . Hypertension   . Hypertensive heart disease   . OSA on CPAP    use DME- Choice titration study was on 02/21/13  . Pinched nerve in neck   . Terminal aortic occlusion ([place]) 01/24/2013   Status post embolectomy    Current Outpatient Medications  Medication Sig Dispense Refill  . allopurinol (ZYLOPRIM) 300 MG tablet Take 1 tablet (300 mg total) by mouth daily. 30 tablet 6  . amLODipine (NORVASC) 10 MG tablet Take 1 tablet (10 mg total) by mouth daily. 30 tablet 5  . Colchicine 0.6 MG CAPS TAKE 2 CAPSULE BY MOUTH ONCE THEN 1 CAPSULE IN AN HOUR  AFTER THAT 1 CAPSULE TWICE DAILY 30 capsule 5  . Nebivolol HCl 20 MG TABS TAKE 1 TABLET(20 MG) BY MOUTH DAILY FOR BLOOD PRESSURE 90 tablet 1  . XARELTO 20 MG TABS tablet TAKE 1 TABLET BY MOUTH DAILY WITH SUPPER 90 tablet 0   No current facility-administered medications for this visit.    Allergies  Allergen Reactions  . Codeine Itching  . Ace Inhibitors Swelling     Swelling of legs    Social History   Socioeconomic History  . Marital status: Divorced    Spouse name: Not on file  . Number of children: Not on file  . Years of education: Not on file  . Highest education level: Not on file  Occupational History  . Not on file  Tobacco Use  . Smoking status: Never Smoker  . Smokeless tobacco: Never Used  Substance and Sexual Activity  . Alcohol use: No    Alcohol/week: 0.0 standard drinks  . Drug use: No  . Sexual activity: Yes    Comment: married  Other Topics Concern  . Not on file  Social History Narrative  . Not on file   Social Determinants of Health   Financial Resource Strain: Not on file  Food Insecurity: Not on file  Transportation Needs: Not on file  Physical Activity: Not on file  Stress: Not on file  Social Connections: Not on file  Intimate Partner Violence: Not on file    Review of Systems  Constitutional: Positive for chills, fever and malaise/fatigue.  HENT: Negative.  Negative for congestion and sore throat.   Respiratory: Negative.  Negative for cough, shortness of breath and wheezing.   Cardiovascular: Negative.  Negative for chest pain and palpitations.  Gastrointestinal: Negative.  Negative for abdominal pain, blood in stool, diarrhea, nausea and vomiting.  Genitourinary: Negative.  Negative for dysuria and hematuria.  Musculoskeletal: Positive for myalgias.  Skin: Negative.  Negative for rash.  Neurological: Positive for dizziness and headaches.  All other systems reviewed and are negative.   Objective  Alert and oriented x3 in no apparent respiratory distress. Vitals as reported by the patient: Today's Vitals   12/31/20 1120  Temp: 100 F (37.8 C)  TempSrc: Oral  Weight: 239 lb (108.4 kg)  Height: 6' 1.5" (1.867 m)    There are no diagnoses linked to this encounter.  Fines was seen today for fatigue.  Diagnoses and all orders for this visit:  Suspected COVID-19 virus infection -     Novel  Coronavirus, NAA ([facility])  Viral illness -     Novel Coronavirus, NAA ([facility])    Clinically stable.  No red flag signs or symptoms. Covid advice given.  ED precautions given. Advised to contact the office if no better or worse during the next several days.  I discussed the assessment and treatment plan with the patient. The patient was provided an opportunity to ask questions and all were answered. The patient agreed with the plan and demonstrated an understanding of the instructions.   The patient was advised to call back or seek an in-person evaluation if the symptoms worsen or if the condition fails to improve as anticipated.  I provided 20 minutes of non-face-to-face time during this encounter.  Horald Pollen, MD  Primary Care at [facility]

## 2021-01-02 LAB — SARS-COV-2, NAA 2 DAY TAT

## 2021-01-02 LAB — NOVEL CORONAVIRUS, NAA: SARS-CoV-2, NAA: DETECTED — AB

## 2021-01-04 ENCOUNTER — Telehealth: Payer: Self-pay | Admitting: Physician Assistant

## 2021-01-04 NOTE — Telephone Encounter (Signed)
Called to discuss with patient about Covid symptoms and the use of a monoclonal antibody infusion for those with mild to moderate Covid symptoms and at a high risk of hospitalization.   He is unvaccinated, but unsure when symptoms started "sometime last week." Per Epic notes, he is likely on Day 7 or 8 of symptoms today. Symptoms have largely resolved and he reports feeling much better.   Pt is qualified for the monoclonal antibody infusion, but due to medication shortages we cannot offer the pt the infusion at this time. This decision was based on clinical judgement as well as the the NIH Covid 19 treatment guidelines for treatment prioritization and equitable access. Symptoms tier reviewed as well as criteria for ending isolation. Preventative practices reviewed. Patient verbalized understanding.  Tami Lin Draven Laine, [place]  01/04/2021 8:59 AM

## 2021-01-20 ENCOUNTER — Encounter: Payer: Self-pay | Admitting: Family Medicine

## 2021-01-20 ENCOUNTER — Other Ambulatory Visit: Payer: Self-pay

## 2021-01-20 ENCOUNTER — Telehealth (INDEPENDENT_AMBULATORY_CARE_PROVIDER_SITE_OTHER): Payer: BLUE CROSS/BLUE SHIELD | Admitting: Family Medicine

## 2021-01-20 DIAGNOSIS — M109 Gout, unspecified: Secondary | ICD-10-CM | POA: Diagnosis not present

## 2021-01-20 MED ORDER — PREDNISONE 20 MG PO TABS: ORAL_TABLET | ORAL | 0 refills | Status: DC

## 2021-01-20 MED ORDER — COLCHICINE 0.6 MG PO CAPS: ORAL_CAPSULE | ORAL | 5 refills | Status: DC

## 2021-01-20 MED ORDER — ALLOPURINOL 300 MG PO TABS: 150.0000 mg | ORAL_TABLET | Freq: Every day | ORAL | 6 refills | Status: DC

## 2021-01-20 NOTE — Patient Instructions (Addendum)
Gout  Gout is painful swelling of your joints. Gout is a type of arthritis. It is caused by having too much uric acid in your body. Uric acid is a chemical that is made when your body breaks down substances called purines. If your body has too much uric acid, sharp crystals can form and build up in your joints. This causes pain and swelling. Gout attacks can happen quickly and be very painful (acute gout). Over time, the attacks can affect more joints and happen more often (chronic gout). What are the causes?  Too much uric acid in your blood. This can happen because: ? Your kidneys do not remove enough uric acid from your blood. ? Your body makes too much uric acid. ? You eat too many foods that are high in purines. These foods include organ meats, some seafood, and beer.  Trauma or stress. What increases the risk?  Having a family history of gout.  Being male and middle-aged.  Being male and having gone through menopause.  Being very overweight (obese).  Drinking alcohol, especially beer.  Not having enough water in the body (being dehydrated).  Losing weight too quickly.  Having an organ transplant.  Having lead poisoning.  Taking certain medicines.  Having kidney disease.  Having a skin condition called psoriasis. What are the signs or symptoms? An attack of acute gout usually happens in just one joint. The most common place is the big toe. Attacks often start at night. Other joints that may be affected include joints of the feet, ankle, knee, fingers, wrist, or elbow. Symptoms of an attack may include:  Very bad pain.  Warmth.  Swelling.  Stiffness.  Shiny, red, or purple skin.  Tenderness. The affected joint may be very painful to touch.  Chills and fever. Chronic gout may cause symptoms more often. More joints may be involved. You may also have white or yellow lumps (tophi) on your hands or feet or in other areas near your joints.   How is this  treated?  Treatment for this condition has two phases: treating an acute attack and preventing future attacks.  Acute gout treatment may include: ? NSAIDs. ? Steroids. These are taken by mouth or injected into a joint. ? Colchicine. This medicine relieves pain and swelling. It can be given by mouth or through an IV tube.  Preventive treatment may include: ? Taking small doses of NSAIDs or colchicine daily. ? Using a medicine that reduces uric acid levels in your blood. ? Making changes to your diet. You may need to see a food expert (dietitian) about what to eat and drink to prevent gout. Follow these instructions at home: During a gout attack  If told, put ice on the painful area: ? Put ice in a plastic bag. ? Place a towel between your skin and the bag. ? Leave the ice on for 20 minutes, 2-3 times a day.  Raise (elevate) the painful joint above the level of your heart as often as you can.  Rest the joint as much as possible. If the joint is in your leg, you may be given crutches.  Follow instructions from your doctor about what you cannot eat or drink.   Avoiding future gout attacks  Eat a low-purine diet. Avoid foods and drinks such as: ? Liver. ? Kidney. ? Anchovies. ? Asparagus. ? Herring. ? Mushrooms. ? Mussels. ? Beer.  Stay at a healthy weight. If you want to lose weight, talk with your doctor.  Do not lose weight too fast.  Start or continue an exercise plan as told by your doctor. Eating and drinking  Drink enough fluids to keep your pee (urine) pale yellow.  If you drink alcohol: ? Limit how much you use to:  0-1 drink a day for women.  0-2 drinks a day for men. ? Be aware of how much alcohol is in your drink. In the [place] one drink equals one 12 oz bottle of beer (355 mL), one 5 oz glass of wine (148 mL), or one 1 oz glass of hard liquor (44 mL). General instructions  Take over-the-counter and prescription medicines only as told by your doctor.  Do  not drive or use heavy machinery while taking prescription pain medicine.  Return to your normal activities as told by your doctor. Ask your doctor what activities are safe for you.  Keep all follow-up visits as told by your doctor. This is important. Contact a doctor if:  You have another gout attack.  You still have symptoms of a gout attack after 10 days of treatment.  You have problems (side effects) because of your medicines.  You have chills or a fever.  You have burning pain when you pee (urinate).  You have pain in your lower back or belly. Get help right away if:  You have very bad pain.  Your pain cannot be controlled.  You cannot pee. Summary  Gout is painful swelling of the joints.  The most common site of pain is the big toe, but it can affect other joints.  Medicines and avoiding some foods can help to prevent and treat gout attacks. This information is not intended to replace advice given to you by your health care provider. Make sure you discuss any questions you have with your health care provider. Document Revised: 07/05/2018 Document Reviewed: 07/05/2018 Elsevier Patient Education  2021 [employer].   If you have lab work done today you will be contacted with your lab results within the next 2 weeks.  If you have not heard from [place] then please contact us. The fastest way to get your results is to register for My Chart.   IF you received an x-ray today, you will receive an invoice from [facility]. Please contact [facility] at [phone] with questions or concerns regarding your invoice.   IF you received labwork today, you will receive an invoice from [facility]. Please contact [facility] at [phone] with questions or concerns regarding your invoice.   Our billing staff will not be able to assist you with questions regarding bills from these companies.  You will be contacted with the lab results as soon as they are available. The  fastest way to get your results is to activate your My Chart account. Instructions are located on the last page of this paperwork. If you have not heard from [place] regarding the results in 2 weeks, please contact this office.

## 2021-01-20 NOTE — Progress Notes (Signed)
Virtual Visit Note  I connected with patient on 01/20/21 at 1310 by telephone due to unable to work Epic video visit and verified that I am speaking with the correct person using two identifiers. Isaiah [name] is currently located at home and no family members are currently with them during visit. The provider, Laurita Quint Yuniel Blaney, FNP is located in their office at time of visit.  I discussed the limitations, risks, security and privacy concerns of performing an evaluation and management service by telephone and the availability of in person appointments. I also discussed with the patient that there may be a patient responsible charge related to this service. The patient expressed understanding and agreed to proceed.   I provided 20 minutes of non-face-to-face time during this encounter.  Chief Complaint  Patient presents with  . gout flare up     Left great toe/Started about 2 days ago , is requesting refills prednisone and colchicine      HPI ? Left side great toe pain This has happened in the past Still takes allopurinol half a pill daily This was decreased from 300 to 150 mg by nephrology Last episode was in December Has been eating rich foods Uses colchicine and prednisone for flares and this works well  Allergies  Allergen Reactions  . Codeine Itching  . Ace Inhibitors Swelling    Swelling of legs    Prior to Admission medications   Medication Sig Start Date End Date Taking? Authorizing Provider  allopurinol (ZYLOPRIM) 300 MG tablet Take 1 tablet (300 mg total) by mouth daily. 05/29/20  Yes Posey Boyer, MD  amLODipine (NORVASC) 10 MG tablet Take 1 tablet (10 mg total) by mouth daily. 05/29/20  Yes Posey Boyer, MD  Nebivolol HCl 20 MG TABS TAKE 1 TABLET(20 MG) BY MOUTH DAILY FOR BLOOD PRESSURE 12/05/20  Yes Sagardia, [place], MD  XARELTO 20 MG TABS tablet TAKE 1 TABLET BY MOUTH DAILY WITH SUPPER 12/21/20  Yes Sagardia, Ines Bloomer, MD  Colchicine 0.6 MG CAPS TAKE 2  CAPSULE BY MOUTH ONCE THEN 1 CAPSULE IN AN HOUR AFTER THAT 1 CAPSULE TWICE DAILY Patient not taking: Reported on 01/20/2021 12/05/20   Posey Boyer, MD    Past Medical History:  Diagnosis Date  . Atrial fibrillation, permanent ([place]) 01/24/2013   chronic,previuosly refuses coumadin,now on xarelto  . CKD (chronic kidney disease) stage 3, GFR 30-59 ml/min (H[facility]) 01/24/2013   Baseline creatinine roughly 1.7-1.8.  Marland Kitchen Erectile dysfunction   . Extremity ischemia, critical bil lower ext. 01/24/2013   Status post bilateral common femoral, profunda femoral and superficial femoral arterial embolectomy  . Gout   . H/O right and left cardiac catheterization November 2009   30-40% RCA disease, cardiac output Fick 6.3, thermodilution 5.3.  Normal artery pressures.  Marland Kitchen Heart murmur   . History of Non-ischemic cardiomyopathy 2009 - 2014   EF previously as low as 35-40% in 2009, up to 40 and 45% by 2012.  Repeat echo January 2014: Echo EF 55-60% mild LVH, mild to moderate left atrial enlargement, mild right atrial enlargement.  . History of TIA (transient ischemic attack) 2005   Carotid Dopplers January 2014 negative for significant stenosis.  . Hypertension   . Hypertensive heart disease   . OSA on CPAP    use DME- Choice titration study was on 02/21/13  . Pinched nerve in neck   . Terminal aortic occlusion (HCC) 01/24/2013   Status post embolectomy    Past Surgical  History:  Procedure Laterality Date  . CARDIAC CATHETERIZATION  Nov 2009   right and left  heart cath ,cardiac output 6.3 by FICK AND 5.34 by thermal  diluation.norm RV pressures and nonobstructive cor disease. no shunt.some intramyocardial bridgingof the LAD  . CAROTID DOPPLERS  Nov 2009   done for TIA which were normal  . DOPPLER ECHOCARDIOGRAPHY  02/2011; 12/2012   a) EF40-45%; LA mod to severe dilated ;; b) EF 55-60%, mild LVH, Mild-Mod LA dilation  . EMBOLECTOMY  01/23/2013   Procedure: EMBOLECTOMY;  Surgeon: Serafina Mitchell, MD;   Location: [facility] OR;  Service: Vascular;  Laterality: Bilateral;  Bilateral femoral Embolectomy, Bilateral Iliac Embolectomy.  Marland Kitchen KNEE SURGERY      Social History   Tobacco Use  . Smoking status: Never Smoker  . Smokeless tobacco: Never Used  Substance Use Topics  . Alcohol use: No    Alcohol/week: 0.0 standard drinks    Family History  Problem Relation Age of Onset  . Hypertension Mother   . Stroke Mother   . Hypertension Father   . Stroke Father   . Hypertension Sister   . Hypertension Brother   . Hypertension Sister     Review of Systems  Respiratory: Negative for cough and shortness of breath.   Cardiovascular: Negative for chest pain and palpitations.  Gastrointestinal: Negative for abdominal pain, blood in stool, constipation, diarrhea, heartburn, nausea and vomiting.  Musculoskeletal: Negative for falls and myalgias.       Left great toe pain  Neurological: Negative for dizziness, tingling and headaches.    Objective  Constitutional:      General: Not in acute distress.    Appearance: Normal appearance. Not ill-appearing.   Pulmonary:     Effort: Pulmonary effort is normal. No respiratory distress.  Neurological:     Mental Status: Alert and oriented to person, place, and time.  Psychiatric:        Mood and Affect: Mood normal.        Behavior: Behavior normal.     ASSESSMENT and PLAN  Problem List Items Addressed This Visit      Musculoskeletal and Integument   Acute gouty arthritis   Relevant Medications   allopurinol (ZYLOPRIM) 300 MG tablet   Colchicine 0.6 MG CAPS   predniSONE (DELTASONE) 20 MG tablet      Plan . Take medications as prescribed for flare . Discussed checking uric acid at next appointment to determine appropriate allopurinol dose . Continue to watch diet . RTC/ED precautions provided   Return if symptoms worsen or fail to improve, for next scheduled visit.    The above assessment and management plan was discussed with the  patient. The patient verbalized understanding of and has agreed to the management plan. Patient is aware to call the clinic if symptoms persist or worsen. Patient is aware when to return to the clinic for a follow-up visit. Patient educated on when it is appropriate to go to the emergency department.     Huston Foley Anajulia Leyendecker, FNP-BC Primary Care at [facility] [place], [place] [address] Ph.  [phone] Fax [phone]

## 2021-03-05 ENCOUNTER — Other Ambulatory Visit: Payer: Self-pay

## 2021-03-05 ENCOUNTER — Encounter: Payer: Self-pay | Admitting: Emergency Medicine

## 2021-03-05 ENCOUNTER — Ambulatory Visit: Payer: BLUE CROSS/BLUE SHIELD | Admitting: Emergency Medicine

## 2021-03-05 VITALS — BP 129/91 | HR 77 | Temp 98.1°F | Resp 16 | Ht 73.5 in | Wt 234.0 lb

## 2021-03-05 DIAGNOSIS — I482 Chronic atrial fibrillation, unspecified: Secondary | ICD-10-CM

## 2021-03-05 DIAGNOSIS — N1832 Chronic kidney disease, stage 3b: Secondary | ICD-10-CM

## 2021-03-05 DIAGNOSIS — Z8739 Personal history of other diseases of the musculoskeletal system and connective tissue: Secondary | ICD-10-CM

## 2021-03-05 DIAGNOSIS — E785 Hyperlipidemia, unspecified: Secondary | ICD-10-CM | POA: Diagnosis not present

## 2021-03-05 DIAGNOSIS — I10 Essential (primary) hypertension: Secondary | ICD-10-CM

## 2021-03-05 NOTE — Patient Instructions (Addendum)
   If you have lab work done today you will be contacted with your lab results within the next 2 weeks.  If you have not heard from us then please contact us. The fastest way to get your results is to register for My Chart.   IF you received an x-ray today, you will receive an invoice from [facility]. Please contact [facility] at [phone] with questions or concerns regarding your invoice.   IF you received labwork today, you will receive an invoice from [facility]. Please contact [facility] at [phone] with questions or concerns regarding your invoice.   Our billing staff will not be able to assist you with questions regarding bills from these companies.  You will be contacted with the lab results as soon as they are available. The fastest way to get your results is to activate your My Chart account. Instructions are located on the last page of this paperwork. If you have not heard from us regarding the results in 2 weeks, please contact this office.      Health Maintenance, Male Adopting a healthy lifestyle and getting preventive care are important in promoting health and wellness. Ask your health care provider about:  The right schedule for you to have regular tests and exams.  Things you can do on your own to prevent diseases and keep yourself healthy. What should I know about diet, weight, and exercise? Eat a healthy diet  Eat a diet that includes plenty of vegetables, fruits, low-fat dairy products, and lean protein.  Do not eat a lot of foods that are high in solid fats, added sugars, or sodium.   Maintain a healthy weight Body mass index (BMI) is a measurement that can be used to identify possible weight problems. It estimates body fat based on height and weight. Your health care provider can help determine your BMI and help you achieve or maintain a healthy weight. Get regular exercise Get regular exercise. This is one of the most important things you  can do for your health. Most adults should:  Exercise for at least 150 minutes each week. The exercise should increase your heart rate and make you sweat (moderate-intensity exercise).  Do strengthening exercises at least twice a week. This is in addition to the moderate-intensity exercise.  Spend less time sitting. Even light physical activity can be beneficial. Watch cholesterol and blood lipids Have your blood tested for lipids and cholesterol at 59 years of age, then have this test every 5 years. You may need to have your cholesterol levels checked more often if:  Your lipid or cholesterol levels are high.  You are older than 59 years of age.  You are at high risk for heart disease. What should I know about cancer screening? Many types of cancers can be detected early and may often be prevented. Depending on your health history and family history, you may need to have cancer screening at various ages. This may include screening for:  Colorectal cancer.  Prostate cancer.  Skin cancer.  Lung cancer. What should I know about heart disease, diabetes, and high blood pressure? Blood pressure and heart disease  High blood pressure causes heart disease and increases the risk of stroke. This is more likely to develop in people who have high blood pressure readings, are of African descent, or are overweight.  Talk with your health care provider about your target blood pressure readings.  Have your blood pressure checked: ? Every 3-5 years if you are   18-39 years of age. ? Every year if you are 40 years old or older.  If you are between the ages of 65 and 75 and are a current or former smoker, ask your health care provider if you should have a one-time screening for abdominal aortic aneurysm (AAA). Diabetes Have regular diabetes screenings. This checks your fasting blood sugar level. Have the screening done:  Once every three years after age 45 if you are at a normal weight and have  a low risk for diabetes.  More often and at a younger age if you are overweight or have a high risk for diabetes. What should I know about preventing infection? Hepatitis B If you have a higher risk for hepatitis B, you should be screened for this virus. Talk with your health care provider to find out if you are at risk for hepatitis B infection. Hepatitis C Blood testing is recommended for:  Everyone born from 1945 through 1965.  Anyone with known risk factors for hepatitis C. Sexually transmitted infections (STIs)  You should be screened each year for STIs, including gonorrhea and chlamydia, if: ? You are sexually active and are younger than 59 years of age. ? You are older than 59 years of age and your health care provider tells you that you are at risk for this type of infection. ? Your sexual activity has changed since you were last screened, and you are at increased risk for chlamydia or gonorrhea. Ask your health care provider if you are at risk.  Ask your health care provider about whether you are at high risk for HIV. Your health care provider may recommend a prescription medicine to help prevent HIV infection. If you choose to take medicine to prevent HIV, you should first get tested for HIV. You should then be tested every 3 months for as long as you are taking the medicine. Follow these instructions at home: Lifestyle  Do not use any products that contain nicotine or tobacco, such as cigarettes, e-cigarettes, and chewing tobacco. If you need help quitting, ask your health care provider.  Do not use street drugs.  Do not share needles.  Ask your health care provider for help if you need support or information about quitting drugs. Alcohol use  Do not drink alcohol if your health care provider tells you not to drink.  If you drink alcohol: ? Limit how much you have to 0-2 drinks a day. ? Be aware of how much alcohol is in your drink. In the [place] one drink equals one 12  oz bottle of beer (355 mL), one 5 oz glass of wine (148 mL), or one 1 oz glass of hard liquor (44 mL). General instructions  Schedule regular health, dental, and eye exams.  Stay current with your vaccines.  Tell your health care provider if: ? You often feel depressed. ? You have ever been abused or do not feel safe at home. Summary  Adopting a healthy lifestyle and getting preventive care are important in promoting health and wellness.  Follow your health care provider's instructions about healthy diet, exercising, and getting tested or screened for diseases.  Follow your health care provider's instructions on monitoring your cholesterol and blood pressure. This information is not intended to replace advice given to you by your health care provider. Make sure you discuss any questions you have with your health care provider. Document Revised: 12/06/2018 Document Reviewed: 12/06/2018 Elsevier Patient Education  2021 Elsevier Inc.  

## 2021-03-05 NOTE — Progress Notes (Signed)
Isaiah [name] 60 y.o.   Chief Complaint  Patient presents with  . Gout    Per patient follow up     HISTORY OF PRESENT ILLNESS: [name] with history of hypertension, gout, chronic kidney disease, chronic A. fib on multiple medications here for follow-up. Doing well.  Has no complaints or medical concerns today. #1 hypertension: On amlodipine 10 mg daily #2 chronic A. fib on long-term anticoagulation treatment: On Bystolic 20 mg daily and Xarelto 20 mg daily #3 chronic kidney disease stage IIIb #4 history of gout: On allopurinol 150 mg in the morning and colchicine at bedtime.  HPI   Prior to Admission medications   Medication Sig Start Date End Date Taking? Authorizing Provider  allopurinol (ZYLOPRIM) 300 MG tablet Take 0.5 tablets (150 mg total) by mouth daily. 01/20/21  Yes Just, Laurita Quint, FNP  amLODipine (NORVASC) 10 MG tablet Take 1 tablet (10 mg total) by mouth daily. 05/29/20  Yes Posey Boyer, MD  Colchicine 0.6 MG CAPS TAKE 2 CAPSULE BY MOUTH ONCE THEN 1 CAPSULE IN AN HOUR AFTER THAT 1 CAPSULE TWICE DAILY 01/20/21  Yes Just, Laurita Quint, FNP  Nebivolol HCl 20 MG TABS TAKE 1 TABLET(20 MG) BY MOUTH DAILY FOR BLOOD PRESSURE 12/05/20  Yes Benoit Meech, Ines Bloomer, MD  XARELTO 20 MG TABS tablet TAKE 1 TABLET BY MOUTH DAILY WITH SUPPER 12/21/20  Yes Horald Pollen, MD    Allergies  Allergen Reactions  . Codeine Itching  . Ace Inhibitors Swelling    Swelling of legs    Patient Active Problem List   Diagnosis Date Noted  . Stage 3b chronic kidney disease ([place]) 12/05/2020  . Hypertensive heart disease   . Dyslipidemia 02/02/2015  . Obesity (BMI 30-39.9) 09/10/2013  . Acute gouty arthritis 03/09/2013  . Terminal aortic occlusion (H[facility]) 01/24/2013  . Atrial fibrillation, permanent ([place]) 01/24/2013  . Essential hypertension 01/24/2013  . Erectile dysfunction 01/24/2013  . CAD (coronary artery disease), non obstructive by cardiac cath 2009 01/24/2013     Past Medical History:  Diagnosis Date  . Atrial fibrillation, permanent ([place]) 01/24/2013   chronic,previuosly refuses coumadin,now on xarelto  . CKD (chronic kidney disease) stage 3, GFR 30-59 ml/min (HCC) 01/24/2013   Baseline creatinine roughly 1.7-1.8.  Marland Kitchen Erectile dysfunction   . Extremity ischemia, critical bil lower ext. 01/24/2013   Status post bilateral common femoral, profunda femoral and superficial femoral arterial embolectomy  . Gout   . H/O right and left cardiac catheterization November 2009   30-40% RCA disease, cardiac output Fick 6.3, thermodilution 5.3.  Normal artery pressures.  Marland Kitchen Heart murmur   . History of Non-ischemic cardiomyopathy 2009 - 2014   EF previously as low as 35-40% in 2009, up to 40 and 45% by 2012.  Repeat echo January 2014: Echo EF 55-60% mild LVH, mild to moderate left atrial enlargement, mild right atrial enlargement.  . History of TIA (transient ischemic attack) 2005   Carotid Dopplers January 2014 negative for significant stenosis.  . Hypertension   . Hypertensive heart disease   . OSA on CPAP    use DME- Choice titration study was on 02/21/13  . Pinched nerve in neck   . Terminal aortic occlusion ([place]) 01/24/2013   Status post embolectomy    Past Surgical History:  Procedure Laterality Date  . CARDIAC CATHETERIZATION  Nov 2009   right and left  heart cath ,cardiac output 6.3 by FICK AND 5.34 by thermal  diluation.norm  RV pressures and nonobstructive cor disease. no shunt.some intramyocardial bridgingof the LAD  . CAROTID DOPPLERS  Nov 2009   done for TIA which were normal  . DOPPLER ECHOCARDIOGRAPHY  02/2011; 12/2012   a) EF40-45%; LA mod to severe dilated ;; b) EF 55-60%, mild LVH, Mild-Mod LA dilation  . EMBOLECTOMY  01/23/2013   Procedure: EMBOLECTOMY;  Surgeon: Serafina Mitchell, MD;  Location: [facility] OR;  Service: Vascular;  Laterality: Bilateral;  Bilateral femoral Embolectomy, Bilateral Iliac Embolectomy.  Marland Kitchen KNEE SURGERY      Social  History   Socioeconomic History  . Marital status: Divorced    Spouse name: Not on file  . Number of children: Not on file  . Years of education: Not on file  . Highest education level: Not on file  Occupational History  . Not on file  Tobacco Use  . Smoking status: Never Smoker  . Smokeless tobacco: Never Used  Substance and Sexual Activity  . Alcohol use: No    Alcohol/week: 0.0 standard drinks  . Drug use: No  . Sexual activity: Yes    Comment: married  Other Topics Concern  . Not on file  Social History Narrative  . Not on file   Social Determinants of Health   Financial Resource Strain: Not on file  Food Insecurity: Not on file  Transportation Needs: Not on file  Physical Activity: Not on file  Stress: Not on file  Social Connections: Not on file  Intimate Partner Violence: Not on file    Family History  Problem Relation Age of Onset  . Hypertension Mother   . Stroke Mother   . Hypertension Father   . Stroke Father   . Hypertension Sister   . Hypertension Brother   . Hypertension Sister      Review of Systems  Constitutional: Negative.  Negative for chills and fever.  HENT: Negative.  Negative for congestion and sore throat.   Respiratory: Negative.  Negative for cough and shortness of breath.   Cardiovascular: Negative.  Negative for chest pain and palpitations.  Gastrointestinal: Negative.  Negative for abdominal pain, diarrhea, melena, nausea and vomiting.  Genitourinary: Negative.  Negative for hematuria.  Skin: Negative.  Negative for rash.  Neurological: Negative.  Negative for dizziness and headaches.  All other systems reviewed and are negative.   Today's Vitals   03/05/21 1609  BP: (!) 129/91  Pulse: 77  Resp: 16  Temp: 98.1 F (36.7 C)  TempSrc: Temporal  SpO2: 99%  Weight: 234 lb (106.1 kg)  Height: 6' 1.5" (1.867 m)   Body mass index is 30.45 kg/m. Wt Readings from Last 3 Encounters:  03/05/21 234 lb (106.1 kg)  12/31/20 239  lb (108.4 kg)  12/05/20 241 lb (109.3 kg)    Physical Exam Vitals reviewed.  Constitutional:      Appearance: Normal appearance.  HENT:     Head: Normocephalic.  Eyes:     Extraocular Movements: Extraocular movements intact.     Pupils: Pupils are equal, round, and reactive to light.  Cardiovascular:     Rate and Rhythm: Normal rate. Rhythm irregular.     Pulses: Normal pulses.     Heart sounds: Normal heart sounds.  Pulmonary:     Effort: Pulmonary effort is normal.     Breath sounds: Normal breath sounds.  Musculoskeletal:        General: Normal range of motion.     Cervical back: Normal range of motion and neck supple.  Skin:    General: Skin is warm and dry.     Capillary Refill: Capillary refill takes less than 2 seconds.  Neurological:     General: No focal deficit present.     Mental Status: He is alert and oriented to person, place, and time.  Psychiatric:        Mood and Affect: Mood normal.        Behavior: Behavior normal.    The 10-year ASCVD risk score Mikey Bussing DC Jr., et al., 2013) is: 12.1%   Values used to calculate the score:     Age: 22 years     Sex: Male     Is Non-Hispanic African American: Yes     Diabetic: No     Tobacco smoker: No     Systolic Blood Pressure: [id number] mmHg     Is BP treated: Yes     HDL Cholesterol: 47 mg/dL     Total Cholesterol: 163 mg/dL Lab Results  Component Value Date   CHOL 163 01/16/2019   HDL 47 01/16/2019   LDLCALC 102 (H) 01/16/2019   TRIG 68 01/16/2019   CHOLHDL 3.5 01/16/2019     ASSESSMENT & PLAN: Clinically stable.  No medical concerns identified during this visit. Continue present medications.  No changes. Follow-up in 6 months. Srihith was seen today for gout.  Diagnoses and all orders for this visit:  Essential hypertension  Stage 3b chronic kidney disease ([place])  Dyslipidemia  Chronic atrial fibrillation ([place])  History of gout    Patient Instructions       If you have lab work done  today you will be contacted with your lab results within the next 2 weeks.  If you have not heard from [place] then please contact us. The fastest way to get your results is to register for My Chart.   IF you received an x-ray today, you will receive an invoice from [facility]. Please contact [facility] at [phone] with questions or concerns regarding your invoice.   IF you received labwork today, you will receive an invoice from [facility]. Please contact [facility] at [phone] with questions or concerns regarding your invoice.   Our billing staff will not be able to assist you with questions regarding bills from these companies.  You will be contacted with the lab results as soon as they are available. The fastest way to get your results is to activate your My Chart account. Instructions are located on the last page of this paperwork. If you have not heard from [place] regarding the results in 2 weeks, please contact this office.     Health Maintenance, Male Adopting a healthy lifestyle and getting preventive care are important in promoting health and wellness. Ask your health care provider about:  The right schedule for you to have regular tests and exams.  Things you can do on your own to prevent diseases and keep yourself healthy. What should I know about diet, weight, and exercise? Eat a healthy diet  Eat a diet that includes plenty of vegetables, fruits, low-fat dairy products, and lean protein.  Do not eat a lot of foods that are high in solid fats, added sugars, or sodium.   Maintain a healthy weight Body mass index (BMI) is a measurement that can be used to identify possible weight problems. It estimates body fat based on height and weight. Your health care provider can help determine your BMI and help you achieve or maintain a healthy weight. Get regular  exercise Get regular exercise. This is one of the most important things you can do for your health. Most adults  should:  Exercise for at least 150 minutes each week. The exercise should increase your heart rate and make you sweat (moderate-intensity exercise).  Do strengthening exercises at least twice a week. This is in addition to the moderate-intensity exercise.  Spend less time sitting. Even light physical activity can be beneficial. Watch cholesterol and blood lipids Have your blood tested for lipids and cholesterol at 59 years of age, then have this test every 5 years. You may need to have your cholesterol levels checked more often if:  Your lipid or cholesterol levels are high.  You are older than 59 years of age.  You are at high risk for heart disease. What should I know about cancer screening? Many types of cancers can be detected early and may often be prevented. Depending on your health history and family history, you may need to have cancer screening at various ages. This may include screening for:  Colorectal cancer.  Prostate cancer.  Skin cancer.  Lung cancer. What should I know about heart disease, diabetes, and high blood pressure? Blood pressure and heart disease  High blood pressure causes heart disease and increases the risk of stroke. This is more likely to develop in people who have high blood pressure readings, are of African descent, or are overweight.  Talk with your health care provider about your target blood pressure readings.  Have your blood pressure checked: ? Every 3-5 years if you are 47-55 years of age. ? Every year if you are 43 years old or older.  If you are between the ages of 60 and 4 and are a current or former smoker, ask your health care provider if you should have a one-time screening for abdominal aortic aneurysm (AAA). Diabetes Have regular diabetes screenings. This checks your fasting blood sugar level. Have the screening done:  Once every three years after age 27 if you are at a normal weight and have a low risk for diabetes.  More  often and at a younger age if you are overweight or have a high risk for diabetes. What should I know about preventing infection? Hepatitis B If you have a higher risk for hepatitis B, you should be screened for this virus. Talk with your health care provider to find out if you are at risk for hepatitis B infection. Hepatitis C Blood testing is recommended for:  Everyone born from [birth year] through 1965.  Anyone with known risk factors for hepatitis C. Sexually transmitted infections (STIs)  You should be screened each year for STIs, including gonorrhea and chlamydia, if: ? You are sexually active and are younger than 59 years of age. ? You are older than 59 years of age and your health care provider tells you that you are at risk for this type of infection. ? Your sexual activity has changed since you were last screened, and you are at increased risk for chlamydia or gonorrhea. Ask your health care provider if you are at risk.  Ask your health care provider about whether you are at high risk for HIV. Your health care provider may recommend a prescription medicine to help prevent HIV infection. If you choose to take medicine to prevent HIV, you should first get tested for HIV. You should then be tested every 3 months for as long as you are taking the medicine. Follow these instructions at home: Lifestyle  Do  not use any products that contain nicotine or tobacco, such as cigarettes, e-cigarettes, and chewing tobacco. If you need help quitting, ask your health care provider.  Do not use street drugs.  Do not share needles.  Ask your health care provider for help if you need support or information about quitting drugs. Alcohol use  Do not drink alcohol if your health care provider tells you not to drink.  If you drink alcohol: ? Limit how much you have to 0-2 drinks a day. ? Be aware of how much alcohol is in your drink. In the [place] one drink equals one 12 oz bottle of beer (355 mL), one 5  oz glass of wine (148 mL), or one 1 oz glass of hard liquor (44 mL). General instructions  Schedule regular health, dental, and eye exams.  Stay current with your vaccines.  Tell your health care provider if: ? You often feel depressed. ? You have ever been abused or do not feel safe at home. Summary  Adopting a healthy lifestyle and getting preventive care are important in promoting health and wellness.  Follow your health care provider's instructions about healthy diet, exercising, and getting tested or screened for diseases.  Follow your health care provider's instructions on monitoring your cholesterol and blood pressure. This information is not intended to replace advice given to you by your health care provider. Make sure you discuss any questions you have with your health care provider. Document Revised: 12/06/2018 Document Reviewed: 12/06/2018 Elsevier Patient Education  2021 Elsevier Inc.       Agustina Caroli, MD [facility]

## 2021-03-27 ENCOUNTER — Other Ambulatory Visit: Payer: Self-pay | Admitting: Emergency Medicine

## 2021-06-05 ENCOUNTER — Other Ambulatory Visit: Payer: Self-pay | Admitting: Emergency Medicine

## 2021-06-05 DIAGNOSIS — I10 Essential (primary) hypertension: Secondary | ICD-10-CM

## 2021-07-08 ENCOUNTER — Other Ambulatory Visit: Payer: Self-pay | Admitting: Emergency Medicine

## 2021-07-25 ENCOUNTER — Encounter (HOSPITAL_COMMUNITY): Payer: Self-pay

## 2021-07-25 ENCOUNTER — Ambulatory Visit (HOSPITAL_COMMUNITY)
Admission: EM | Admit: 2021-07-25 | Discharge: 2021-07-25 | Disposition: A | Discharge status: Home / Self Care | Payer: BLUE CROSS/BLUE SHIELD | Attending: Family Medicine | Admitting: Family Medicine

## 2021-07-25 ENCOUNTER — Other Ambulatory Visit: Payer: Self-pay

## 2021-07-25 DIAGNOSIS — Z8739 Personal history of other diseases of the musculoskeletal system and connective tissue: Secondary | ICD-10-CM | POA: Diagnosis not present

## 2021-07-25 DIAGNOSIS — R600 Localized edema: Secondary | ICD-10-CM | POA: Diagnosis not present

## 2021-07-25 DIAGNOSIS — M79672 Pain in left foot: Secondary | ICD-10-CM | POA: Diagnosis not present

## 2021-07-25 MED ORDER — DEXAMETHASONE SODIUM PHOSPHATE 10 MG/ML IJ SOLN
10.0000 mg | Freq: Once | INTRAMUSCULAR | Status: AC
  Administered 2021-07-25: 10 mg via INTRAMUSCULAR

## 2021-07-25 MED ORDER — DEXAMETHASONE SODIUM PHOSPHATE 10 MG/ML IJ SOLN
INTRAMUSCULAR | Status: AC
  Filled 2021-07-25: qty 1

## 2021-07-25 NOTE — ED Provider Notes (Signed)
[place]    CSN: [id number] Arrival date & time: 07/25/21  1012      History   Chief Complaint Chief Complaint  Patient presents with   Gout    HPI ELIAS LOTZE is a 60 y.o. male.   Patient presenting today with 5-day history of left great toe and lateral ankle pain, swelling of the foot.  He states the pain is sharp, severe and constant and feels like prior gout flares.  He started taking colchicine yesterday and notes that has not yet been helpful.  Denies injury to the area, fever, chills, numbness, tingling, pain up into the leg, dizziness, chest pain, shortness of breath, palpitations.  Does take Xarelto for history of PAD, atrial fibrillation.   Past Medical History:  Diagnosis Date   Atrial fibrillation, permanent ([place]) 01/24/2013   chronic,previuosly refuses coumadin,now on xarelto   CKD (chronic kidney disease) stage 3, GFR 30-59 ml/min (HCC) 01/24/2013   Baseline creatinine roughly 1.7-1.8.   Erectile dysfunction    Extremity ischemia, critical bil lower ext. 01/24/2013   Status post bilateral common femoral, profunda femoral and superficial femoral arterial embolectomy   Gout    H/O right and left cardiac catheterization November 2009   30-40% RCA disease, cardiac output Fick 6.3, thermodilution 5.3.  Normal artery pressures.   Heart murmur    History of Non-ischemic cardiomyopathy 2009 - 2014   EF previously as low as 35-40% in 2009, up to 40 and 45% by 2012.  Repeat echo January 2014: Echo EF 55-60% mild LVH, mild to moderate left atrial enlargement, mild right atrial enlargement.   History of TIA (transient ischemic attack) 2005   Carotid Dopplers January 2014 negative for significant stenosis.   Hypertension    Hypertensive heart disease    OSA on CPAP    use DME- Choice titration study was on 02/21/13   Pinched nerve in neck    Terminal aortic occlusion ([place]) 01/24/2013   Status post embolectomy    Patient Active Problem List   Diagnosis  Date Noted   Stage 3b chronic kidney disease ([place]) 12/05/2020   Hypertensive heart disease    Dyslipidemia 02/02/2015   Obesity (BMI 30-39.9) 09/10/2013   Acute gouty arthritis 03/09/2013   Terminal aortic occlusion ([place]) 01/24/2013   Atrial fibrillation, permanent ([place]) 01/24/2013   Essential hypertension 01/24/2013   Erectile dysfunction 01/24/2013   CAD (coronary artery disease), non obstructive by cardiac cath 2009 01/24/2013    Past Surgical History:  Procedure Laterality Date   CARDIAC CATHETERIZATION  Nov 2009   right and left  heart cath ,cardiac output 6.3 by FICK AND 5.34 by thermal  diluation.norm RV pressures and nonobstructive cor disease. no shunt.some intramyocardial bridgingof the LAD   CAROTID DOPPLERS  Nov 2009   done for TIA which were normal   DOPPLER ECHOCARDIOGRAPHY  02/2011; 12/2012   a) EF40-45%; LA mod to severe dilated ;; b) EF 55-60%, mild LVH, Mild-Mod LA dilation   EMBOLECTOMY  01/23/2013   Procedure: EMBOLECTOMY;  Surgeon: Serafina Mitchell, MD;  Location: [facility] OR;  Service: Vascular;  Laterality: Bilateral;  Bilateral femoral Embolectomy, Bilateral Iliac Embolectomy.   KNEE SURGERY         Home Medications    Prior to Admission medications   Medication Sig Start Date End Date Taking? Authorizing Provider  allopurinol (ZYLOPRIM) 300 MG tablet Take 0.5 tablets (150 mg total) by mouth daily. 01/20/21   Just, Laurita Quint, FNP  amLODipine (NORVASC) 10  MG tablet Take 1 tablet (10 mg total) by mouth daily. 05/29/20   Posey Boyer, MD  Colchicine 0.6 MG CAPS TAKE 2 CAPSULE BY MOUTH ONCE THEN 1 CAPSULE IN AN HOUR AFTER THAT 1 CAPSULE TWICE DAILY 01/20/21   Just, Laurita Quint, FNP  Nebivolol HCl 20 MG TABS TAKE 1 TABLET(20 MG) BY MOUTH DAILY FOR BLOOD PRESSURE 06/05/21   Sagardia, Ines Bloomer, MD  XARELTO 20 MG TABS tablet TAKE 1 TABLET BY MOUTH DAILY WITH SUPPER 07/08/21   Horald Pollen, MD    Family History Family History  Problem Relation Age of Onset    Hypertension Mother    Stroke Mother    Hypertension Father    Stroke Father    Hypertension Sister    Hypertension Brother    Hypertension Sister     Social History Social History   Tobacco Use   Smoking status: Never   Smokeless tobacco: Never  Substance Use Topics   Alcohol use: No    Alcohol/week: 0.0 standard drinks   Drug use: No     Allergies   Codeine and Ace inhibitors   Review of Systems Review of Systems Per HPI  Physical Exam Triage Vital Signs ED Triage Vitals  Enc Vitals Group     BP 07/25/21 1125 (!) 173/105     Pulse Rate 07/25/21 1125 66     Resp 07/25/21 1125 18     Temp 07/25/21 1125 98.3 F (36.8 C)     Temp Source 07/25/21 1125 Oral     SpO2 07/25/21 1125 96 %     Weight --      Height --      Head Circumference --      Peak Flow --      Pain Score 07/25/21 1118 10     Pain Loc --      Pain Edu? --      Excl. in [place]? --    No data found.  Updated Vital Signs BP (!) 173/105 (BP Location: Right Arm)   Pulse 66   Temp 98.3 F (36.8 C) (Oral)   Resp 18   SpO2 96%   Visual Acuity Right Eye Distance:   Left Eye Distance:   Bilateral Distance:    Right Eye Near:   Left Eye Near:    Bilateral Near:     Physical Exam Vitals and nursing note reviewed.  Constitutional:      Appearance: Normal appearance.  HENT:     Head: Atraumatic.  Eyes:     Extraocular Movements: Extraocular movements intact.     Conjunctiva/sclera: Conjunctivae normal.  Cardiovascular:     Rate and Rhythm: Normal rate and regular rhythm.  Pulmonary:     Effort: Pulmonary effort is normal.     Breath sounds: Normal breath sounds.  Musculoskeletal:        General: Swelling and tenderness present. No signs of injury. Normal range of motion.     Cervical back: Normal range of motion and neck supple.     Comments: Diffuse edema of left foot and ankle, tender to palpation lateral ankle and great toe.  Range of motion intact but reduced due to pain  Skin:     General: Skin is warm and dry.     Findings: No erythema.  Neurological:     General: No focal deficit present.     Mental Status: He is oriented to person, place, and time.  Psychiatric:  Mood and Affect: Mood normal.        Thought Content: Thought content normal.        Judgment: Judgment normal.     UC Treatments / Results  Labs (all labs ordered are listed, but only abnormal results are displayed) Labs Reviewed - No data to display  EKG   Radiology No results found.  Procedures Procedures (including critical care time)  Medications Ordered in UC Medications  dexamethasone (DECADRON) injection 10 mg (has no administration in time range)    Initial Impression / Assessment and Plan / UC Course  I have reviewed the triage vital signs and the nursing notes.  Pertinent labs & imaging results that were available during my care of the patient were reviewed by me and considered in my medical decision making (see chart for details).     Imaging deferred today with shared decision making given no acute injury and consistency with past gout flares.  We will treat with IM Decadron in addition to the colchicine which she just refilled yesterday.  RICE protocol reviewed additionally and follow-up with PCP for recheck next week.  Return for acutely worsening symptoms at any time.  Final Clinical Impressions(s) / UC Diagnoses   Final diagnoses:  Left foot pain  Edema of left foot  History of gout   Discharge Instructions   None    ED Prescriptions   None    PDMP not reviewed this encounter.   Volney American, [place] 07/25/21 1213

## 2021-07-25 NOTE — ED Triage Notes (Signed)
Pt reports left foot pain, related to gout flared up x 5 days.

## 2021-07-28 ENCOUNTER — Ambulatory Visit: Payer: BLUE CROSS/BLUE SHIELD | Admitting: Internal Medicine

## 2021-07-28 ENCOUNTER — Encounter: Payer: Self-pay | Admitting: Internal Medicine

## 2021-07-28 ENCOUNTER — Other Ambulatory Visit: Payer: Self-pay

## 2021-07-28 VITALS — BP 144/86 | HR 78 | Ht 73.5 in | Wt 228.0 lb

## 2021-07-28 DIAGNOSIS — I119 Hypertensive heart disease without heart failure: Secondary | ICD-10-CM

## 2021-07-28 DIAGNOSIS — M109 Gout, unspecified: Secondary | ICD-10-CM

## 2021-07-28 DIAGNOSIS — I4821 Permanent atrial fibrillation: Secondary | ICD-10-CM | POA: Diagnosis not present

## 2021-07-28 DIAGNOSIS — I10 Essential (primary) hypertension: Secondary | ICD-10-CM | POA: Diagnosis not present

## 2021-07-28 DIAGNOSIS — Z7901 Long term (current) use of anticoagulants: Secondary | ICD-10-CM | POA: Insufficient documentation

## 2021-07-28 MED ORDER — METHYLPREDNISOLONE ACETATE 80 MG/ML IJ SUSP
80.0000 mg | Freq: Once | INTRAMUSCULAR | Status: AC
Start: 2021-07-28 — End: 2021-07-28
  Administered 2021-07-28: 80 mg via INTRAMUSCULAR

## 2021-07-28 MED ORDER — PREDNISONE 10 MG PO TABS
ORAL_TABLET | ORAL | 0 refills | Status: DC
Start: 2021-07-28 — End: 2021-08-26

## 2021-07-28 NOTE — Assessment & Plan Note (Signed)
Stable, to continue xarelto

## 2021-07-28 NOTE — Assessment & Plan Note (Signed)
Volume o/w stable, no evidence for decompensation, continue current med tx - BB

## 2021-07-28 NOTE — Assessment & Plan Note (Signed)
Uncontrolled at least moderate persistent, today for depomedrol im 80, then predpac asd, ok to hold further colchicine,  to f/u any worsening symptoms or concerns

## 2021-07-28 NOTE — Assessment & Plan Note (Signed)
Mild uncontrolled today, pt to continue to f/u bp at home and next visit

## 2021-07-28 NOTE — Assessment & Plan Note (Signed)
Stable rate, cont current BB

## 2021-07-28 NOTE — Patient Instructions (Signed)
You had the steroid shot today  Please take all new medication as prescribed - the prednisone  Please continue all other medications as before, and refills have been done if requested.  Please have the pharmacy call with any other refills you may need  Please keep your appointments with your specialists as you may have planned     

## 2021-07-28 NOTE — Progress Notes (Signed)
Patient ID: Isaiah [name], male   DOB: 1961/06/08, 60 y.o.   MRN: [id number]        Chief Complaint: follow up left foot gout episode, elevated BP       HPI:  Isaiah [name] is a 60 y.o. male here after acute onset 3 days ago of severe distal left foot pain, redness, swelling without trauma or fever, c/w with his prior episodes of gouty attacks to the knees, right foot and wrist.  Pt seen and tx in UC with steroid shot, and colchicine but pain only improved from 10/10 to 7 /10 and still with red, tender, swelling better but persistent.  No falls.  Pt denies chest pain, increased sob or doe, wheezing, orthopnea, PND, increased LE swelling, palpitations, dizziness or syncope.   Pt denies polydipsia, polyuria, or new focal neuro s/s.   Pt denies fever, wt loss, night sweats, loss of appetite, or other constitutional symptoms  BP has been < 140/90 at home but not checked recently.       Wt Readings from Last 3 Encounters:  07/28/21 228 lb (103.4 kg)  03/05/21 234 lb (106.1 kg)  12/31/20 239 lb (108.4 kg)   BP Readings from Last 3 Encounters:  07/28/21 (!) 144/86  07/25/21 (!) 173/105  03/05/21 (!) 129/91         Past Medical History:  Diagnosis Date   Atrial fibrillation, permanent ([place]) 01/24/2013   chronic,previuosly refuses coumadin,now on xarelto   CKD (chronic kidney disease) stage 3, GFR 30-59 ml/min (HCC) 01/24/2013   Baseline creatinine roughly 1.7-1.8.   Erectile dysfunction    Extremity ischemia, critical bil lower ext. 01/24/2013   Status post bilateral common femoral, profunda femoral and superficial femoral arterial embolectomy   Gout    H/O right and left cardiac catheterization November 2009   30-40% RCA disease, cardiac output Fick 6.3, thermodilution 5.3.  Normal artery pressures.   Heart murmur    History of Non-ischemic cardiomyopathy 2009 - 2014   EF previously as low as 35-40% in 2009, up to 40 and 45% by 2012.  Repeat echo January 2014: Echo EF 55-60% mild LVH, mild to  moderate left atrial enlargement, mild right atrial enlargement.   History of TIA (transient ischemic attack) 2005   Carotid Dopplers January 2014 negative for significant stenosis.   Hypertension    Hypertensive heart disease    OSA on CPAP    use DME- Choice titration study was on 02/21/13   Pinched nerve in neck    Terminal aortic occlusion ([place]) 01/24/2013   Status post embolectomy   Past Surgical History:  Procedure Laterality Date   CARDIAC CATHETERIZATION  Nov 2009   right and left  heart cath ,cardiac output 6.3 by FICK AND 5.34 by thermal  diluation.norm RV pressures and nonobstructive cor disease. no shunt.some intramyocardial bridgingof the LAD   CAROTID DOPPLERS  Nov 2009   done for TIA which were normal   DOPPLER ECHOCARDIOGRAPHY  02/2011; 12/2012   a) EF40-45%; LA mod to severe dilated ;; b) EF 55-60%, mild LVH, Mild-Mod LA dilation   EMBOLECTOMY  01/23/2013   Procedure: EMBOLECTOMY;  Surgeon: Serafina Mitchell, MD;  Location: [facility] OR;  Service: Vascular;  Laterality: Bilateral;  Bilateral femoral Embolectomy, Bilateral Iliac Embolectomy.   KNEE SURGERY      reports that he has never smoked. He has never used smokeless tobacco. He reports that he does not drink alcohol and does not use drugs. family history includes  Hypertension in his brother, father, mother, sister, and sister; Stroke in his father and mother. Allergies  Allergen Reactions   Codeine Itching   Ace Inhibitors Swelling    Swelling of legs   Current Outpatient Medications on File Prior to Visit  Medication Sig Dispense Refill   allopurinol (ZYLOPRIM) 300 MG tablet Take 0.5 tablets (150 mg total) by mouth daily. 30 tablet 6   amLODipine (NORVASC) 10 MG tablet Take 1 tablet (10 mg total) by mouth daily. 30 tablet 5   Colchicine 0.6 MG CAPS TAKE 2 CAPSULE BY MOUTH ONCE THEN 1 CAPSULE IN AN HOUR AFTER THAT 1 CAPSULE TWICE DAILY 30 capsule 5   Nebivolol HCl 20 MG TABS TAKE 1 TABLET(20 MG) BY MOUTH DAILY FOR BLOOD  PRESSURE 90 tablet 1   XARELTO 20 MG TABS tablet TAKE 1 TABLET BY MOUTH DAILY WITH SUPPER 90 tablet 0   No current facility-administered medications on file prior to visit.        ROS:  All others reviewed and negative.  Objective        PE:  BP (!) 144/86 (BP Location: Left Arm, Patient Position: Sitting, Cuff Size: Large)   Pulse 78   Ht 6' 1.5" (1.867 m)   Wt 228 lb (103.4 kg)   SpO2 98%   BMI 29.67 kg/m                 Constitutional: Pt appears in NAD               HENT: Head: NCAT.                Right Ear: External ear normal.                 Left Ear: External ear normal.                Eyes: . Pupils are equal, round, and reactive to light. Conjunctivae and EOM are normal               Nose: without d/c or deformity               Neck: Neck supple. Gross normal ROM               Cardiovascular: Normal rate and irregular rhythm.                 Pulmonary/Chest: Effort normal and breath sounds without rales or wheezing.                Abd:  Soft, NT, ND, + BS, no organomegaly               Neurological: Pt is alert. At baseline orientation, motor grossly intact               Skin: Skin is warm. No rashes, no other new lesions, LE edema - none except for left distal foot with 2-3+ red, tender, swelling worse at the first <TP                Psychiatric: Pt behavior is normal without agitation   Micro: none  Cardiac tracings I have personally interpreted today:  none  Pertinent Radiological findings (summarize): none   Lab Results  Component Value Date   WBC 6.6 05/23/2020   HGB 14.2 05/23/2020   HCT 44.1 (A) 05/23/2020   PLT 150 01/16/2019   GLUCOSE 86 12/05/2020   CHOL 163 01/16/2019   TRIG 68 01/16/2019  HDL 47 01/16/2019   LDLCALC 102 (H) 01/16/2019   ALT 26 12/05/2020   AST 33 12/05/2020   NA 142 12/05/2020   K 3.6 12/05/2020   CL 104 12/05/2020   CREATININE 2.64 (H) 12/05/2020   BUN 25 (H) 12/05/2020   CO2 22 12/05/2020   TSH 2.197 01/25/2013    HGBA1C 5.8 (H) 05/23/2020   Assessment/Plan:  Isaiah [name] is a 60 y.o. Black or African American [2] male with  has a past medical history of Atrial fibrillation, permanent ([place]) (01/24/2013), CKD (chronic kidney disease) stage 3, GFR 30-59 ml/min (HCC) (01/24/2013), Erectile dysfunction, Extremity ischemia, critical bil lower ext. (01/24/2013), Gout, H/O right and left cardiac catheterization (November 2009), Heart murmur, History of Non-ischemic cardiomyopathy (2009 - 2014), History of TIA (transient ischemic attack) (2005), Hypertension, Hypertensive heart disease, OSA on CPAP, Pinched nerve in neck, and Terminal aortic occlusion ([place]) (01/24/2013).  Acute gouty arthritis Uncontrolled at least moderate persistent, today for depomedrol im 80, then predpac asd, ok to hold further colchicine,  to f/u any worsening symptoms or concerns  Essential hypertension Mild uncontrolled today, pt to continue to f/u bp at home and next visit  Hypertensive heart disease Volume o/w stable, no evidence for decompensation, continue current med tx - BB  Atrial fibrillation, permanent (H[facility]) Stable rate, cont current BB  Chronic anticoagulation Stable, to continue xarelto  Followup: Return if symptoms worsen or fail to improve.  Cathlean Cower, MD 07/28/2021 11:04 PM [facility]

## 2021-08-04 MED ORDER — METHYLPREDNISOLONE ACETATE 80 MG/ML IJ SUSP
80.0000 mg | Freq: Once | INTRAMUSCULAR | Status: AC
  Administered 2021-07-28: 80 mg via INTRAMUSCULAR

## 2021-08-04 NOTE — Addendum Note (Signed)
Addended by: Shirlyn Goltz on: 08/04/2021 02:08 PM   Modules accepted: Orders

## 2021-08-26 ENCOUNTER — Ambulatory Visit: Payer: BLUE CROSS/BLUE SHIELD | Admitting: Emergency Medicine

## 2021-08-26 ENCOUNTER — Encounter: Payer: Self-pay | Admitting: Emergency Medicine

## 2021-08-26 ENCOUNTER — Other Ambulatory Visit: Payer: Self-pay

## 2021-08-26 VITALS — BP 132/82 | HR 80 | Temp 99.5°F | Ht 72.0 in | Wt 220.0 lb

## 2021-08-26 DIAGNOSIS — M109 Gout, unspecified: Secondary | ICD-10-CM | POA: Diagnosis not present

## 2021-08-26 MED ORDER — COLCHICINE 0.6 MG PO TABS: ORAL_TABLET | ORAL | 1 refills | Status: DC

## 2021-08-26 MED ORDER — METHYLPREDNISOLONE ACETATE 40 MG/ML IJ SUSP
40.0000 mg | Freq: Once | INTRAMUSCULAR | Status: AC
  Administered 2021-08-26: 40 mg via INTRAMUSCULAR

## 2021-08-26 MED ORDER — PREDNISONE 20 MG PO TABS: 40.0000 mg | ORAL_TABLET | Freq: Every day | ORAL | 0 refills | Status: AC

## 2021-08-26 MED ORDER — TRIAMCINOLONE ACETONIDE 40 MG/ML IJ SUSP: 40.0000 mg | Freq: Once | INTRAMUSCULAR | Status: DC

## 2021-08-26 NOTE — Patient Instructions (Signed)
Gout Gout is painful swelling of your joints. Gout is a type of arthritis. It is caused by having too much uric acid in your body. Uric acid is a chemical that is made when your body breaks down substances called purines. If your body has too much uric acid, sharp crystals can form and build up in your joints. This causes pain and swelling. Gout attacks can happen quickly and be very painful (acute gout). Over time, the attacks can affect more joints and happen more often (chronic gout). What are the causes? Too much uric acid in your blood. This can happen because: Your kidneys do not remove enough uric acid from your blood. Your body makes too much uric acid. You eat too many foods that are high in purines. These foods include organ meats, some seafood, and beer. Trauma or stress. What increases the risk? Having a family history of gout. Being male and middle-aged. Being male and having gone through menopause. Being very overweight (obese). Drinking alcohol, especially beer. Not having enough water in the body (being dehydrated). Losing weight too quickly. Having an organ transplant. Having lead poisoning. Taking certain medicines. Having kidney disease. Having a skin condition called psoriasis. What are the signs or symptoms? An attack of acute gout usually happens in just one joint. The most common place is the big toe. Attacks often start at night. Other joints that may be affected include joints of the feet, ankle, knee, fingers, wrist, or elbow. Symptoms of an attack may include: Very bad pain. Warmth. Swelling. Stiffness. Shiny, red, or purple skin. Tenderness. The affected joint may be very painful to touch. Chills and fever. Chronic gout may cause symptoms more often. More joints may be involved. You may also have white or yellow lumps (tophi) on your hands or feet or in other areas near your joints. How is this treated? Treatment for this condition has two phases:  treating an acute attack and preventing future attacks. Acute gout treatment may include: NSAIDs. Steroids. These are taken by mouth or injected into a joint. Colchicine. This medicine relieves pain and swelling. It can be given by mouth or through an IV tube. Preventive treatment may include: Taking small doses of NSAIDs or colchicine daily. Using a medicine that reduces uric acid levels in your blood. Making changes to your diet. You may need to see a food expert (dietitian) about what to eat and drink to prevent gout. Follow these instructions at home: During a gout attack  If told, put ice on the painful area: Put ice in a plastic bag. Place a towel between your skin and the bag. Leave the ice on for 20 minutes, 2-3 times a day. Raise (elevate) the painful joint above the level of your heart as often as you can. Rest the joint as much as possible. If the joint is in your leg, you may be given crutches. Follow instructions from your doctor about what you cannot eat or drink. Avoiding future gout attacks Eat a low-purine diet. Avoid foods and drinks such as: Liver. Kidney. Anchovies. Asparagus. Herring. Mushrooms. Mussels. Beer. Stay at a healthy weight. If you want to lose weight, talk with your doctor. Do not lose weight too fast. Start or continue an exercise plan as told by your doctor. Eating and drinking Drink enough fluids to keep your pee (urine) pale yellow. If you drink alcohol: Limit how much you use to: 0-1 drink a day for women. 0-2 drinks a day for men. Be aware of   how much alcohol is in your drink. In the [place] one drink equals one 12 oz bottle of beer (355 mL), one 5 oz glass of wine (148 mL), or one 1 oz glass of hard liquor (44 mL). General instructions Take over-the-counter and prescription medicines only as told by your doctor. Do not drive or use heavy machinery while taking prescription pain medicine. Return to your normal activities as told by your  doctor. Ask your doctor what activities are safe for you. Keep all follow-up visits as told by your doctor. This is important. Contact a doctor if: You have another gout attack. You still have symptoms of a gout attack after 10 days of treatment. You have problems (side effects) because of your medicines. You have chills or a fever. You have burning pain when you pee (urinate). You have pain in your lower back or belly. Get help right away if: You have very bad pain. Your pain cannot be controlled. You cannot pee. Summary Gout is painful swelling of the joints. The most common site of pain is the big toe, but it can affect other joints. Medicines and avoiding some foods can help to prevent and treat gout attacks. This information is not intended to replace advice given to you by your health care provider. Make sure you discuss any questions you have with your health care provider. Document Revised: 07/05/2018 Document Reviewed: 07/05/2018 Elsevier Patient Education  2022 [employer]  

## 2021-08-26 NOTE — Progress Notes (Signed)
Isaiah [name] 60 y.o.   Chief Complaint  Patient presents with   Gout    Left foot and knee, x 4 days    HISTORY OF PRESENT ILLNESS: [name] complaining of gout flareup.  Complaining of pain to left foot and knee for the past 4 days. Denies injury.  Denies any other significant associated symptomatology.  HPI   Prior to Admission medications   Medication Sig Start Date End Date Taking? Authorizing Provider  allopurinol (ZYLOPRIM) 300 MG tablet Take 0.5 tablets (150 mg total) by mouth daily. 01/20/21  Yes Just, Laurita Quint, FNP  amLODipine (NORVASC) 10 MG tablet Take 1 tablet (10 mg total) by mouth daily. 05/29/20  Yes Posey Boyer, MD  Colchicine 0.6 MG CAPS TAKE 2 CAPSULE BY MOUTH ONCE THEN 1 CAPSULE IN AN HOUR AFTER THAT 1 CAPSULE TWICE DAILY 01/20/21  Yes Just, Laurita Quint, FNP  Nebivolol HCl 20 MG TABS TAKE 1 TABLET(20 MG) BY MOUTH DAILY FOR BLOOD PRESSURE 06/05/21  Yes [place], Ines Bloomer, MD  predniSONE (DELTASONE) 10 MG tablet 3 tabs by mouth per day for 3 days,2tabs per day for 3 days,1tab per day for 3 days 07/28/21  Yes Biagio Borg, MD  XARELTO 20 MG TABS tablet TAKE 1 TABLET BY MOUTH DAILY WITH SUPPER 07/08/21  Yes Horald Pollen, MD    Allergies  Allergen Reactions   Codeine Itching   Ace Inhibitors Swelling    Swelling of legs    Patient Active Problem List   Diagnosis Date Noted   Chronic anticoagulation 07/28/2021   Stage 3b chronic kidney disease ([place]) 12/05/2020   Hypertensive heart disease    Dyslipidemia 02/02/2015   Obesity (BMI 30-39.9) 09/10/2013   Acute gouty arthritis 03/09/2013   Terminal aortic occlusion ([place]) 01/24/2013   Atrial fibrillation, permanent ([place]) 01/24/2013   Essential hypertension 01/24/2013   Erectile dysfunction 01/24/2013   CAD (coronary artery disease), non obstructive by cardiac cath 2009 01/24/2013    Past Medical History:  Diagnosis Date   Atrial fibrillation, permanent ([place]) 01/24/2013    chronic,previuosly refuses coumadin,now on xarelto   CKD (chronic kidney disease) stage 3, GFR 30-59 ml/min ([place]) 01/24/2013   Baseline creatinine roughly 1.7-1.8.   Erectile dysfunction    Extremity ischemia, critical bil lower ext. 01/24/2013   Status post bilateral common femoral, profunda femoral and superficial femoral arterial embolectomy   Gout    H/O right and left cardiac catheterization November 2009   30-40% RCA disease, cardiac output Fick 6.3, thermodilution 5.3.  Normal artery pressures.   Heart murmur    History of Non-ischemic cardiomyopathy 2009 - 2014   EF previously as low as 35-40% in 2009, up to 40 and 45% by 2012.  Repeat echo January 2014: Echo EF 55-60% mild LVH, mild to moderate left atrial enlargement, mild right atrial enlargement.   History of TIA (transient ischemic attack) 2005   Carotid Dopplers January 2014 negative for significant stenosis.   Hypertension    Hypertensive heart disease    OSA on CPAP    use DME- Choice titration study was on 02/21/13   Pinched nerve in neck    Terminal aortic occlusion ([place]) 01/24/2013   Status post embolectomy    Past Surgical History:  Procedure Laterality Date   CARDIAC CATHETERIZATION  Nov 2009   right and left  heart cath ,cardiac output 6.3 by FICK AND 5.34 by thermal  diluation.norm RV pressures and nonobstructive cor disease. no  shunt.some intramyocardial bridgingof the LAD   CAROTID DOPPLERS  Nov 2009   done for TIA which were normal   DOPPLER ECHOCARDIOGRAPHY  02/2011; 12/2012   a) EF40-45%; LA mod to severe dilated ;; b) EF 55-60%, mild LVH, Mild-Mod LA dilation   EMBOLECTOMY  01/23/2013   Procedure: EMBOLECTOMY;  Surgeon: Serafina Mitchell, MD;  Location: [facility] OR;  Service: Vascular;  Laterality: Bilateral;  Bilateral femoral Embolectomy, Bilateral Iliac Embolectomy.   KNEE SURGERY      Social History   Socioeconomic History   Marital status: Divorced    Spouse name: Not on file   Number of children: Not on  file   Years of education: Not on file   Highest education level: Not on file  Occupational History   Not on file  Tobacco Use   Smoking status: Never   Smokeless tobacco: Never  Substance and Sexual Activity   Alcohol use: No    Alcohol/week: 0.0 standard drinks   Drug use: No   Sexual activity: Yes    Comment: married  Other Topics Concern   Not on file  Social History Narrative   Not on file   Social Determinants of Health   Financial Resource Strain: Not on file  Food Insecurity: Not on file  Transportation Needs: Not on file  Physical Activity: Not on file  Stress: Not on file  Social Connections: Not on file  Intimate Partner Violence: Not on file    Family History  Problem Relation Age of Onset   Hypertension Mother    Stroke Mother    Hypertension Father    Stroke Father    Hypertension Sister    Hypertension Brother    Hypertension Sister      Review of Systems  Constitutional: Negative.  Negative for chills and fever.  HENT: Negative.  Negative for congestion and sore throat.   Respiratory: Negative.  Negative for cough and shortness of breath.   Cardiovascular:  Negative for chest pain and palpitations.  Gastrointestinal:  Negative for abdominal pain, nausea and vomiting.  Genitourinary:  Negative for dysuria and hematuria.  Musculoskeletal:  Positive for joint pain.  Neurological: Negative.  Negative for dizziness and headaches.   Today's Vitals   08/26/21 1437  BP: 132/82  Pulse: 80  Temp: 99.5 F (37.5 C)  TempSrc: Oral  SpO2: 97%  Weight: 220 lb (99.8 kg)  Height: 6' (1.829 m)   Body mass index is 29.84 kg/m.  Physical Exam Vitals reviewed.  Constitutional:      Appearance: Normal appearance.  HENT:     Head: Normocephalic.  Eyes:     Extraocular Movements: Extraocular movements intact.     Pupils: Pupils are equal, round, and reactive to light.  Cardiovascular:     Rate and Rhythm: Normal rate.  Pulmonary:     Effort:  Pulmonary effort is normal.  Musculoskeletal:     Cervical back: Normal range of motion.     Comments: Left ankle: Lateral aspect warm to touch with erythema swelling and tenderness Left knee: Mild anterior tenderness but no significant swelling  Skin:    General: Skin is warm and dry.  Neurological:     General: No focal deficit present.     Mental Status: He is alert and oriented to person, place, and time.  Psychiatric:        Mood and Affect: Mood normal.        Behavior: Behavior normal.     ASSESSMENT &  PLAN: Sylys was seen today for gout.  Diagnoses and all orders for this visit:  Acute gouty arthritis -     Discontinue: triamcinolone acetonide (KENALOG-40) injection 40 mg -     colchicine 0.6 MG tablet; Take 1.2 mg now and 0.6 mg one hour later. Take 0.6 mg daily after that x 5 days. -     predniSONE (DELTASONE) 20 MG tablet; Take 2 tablets (40 mg total) by mouth daily with breakfast for 5 days.  Patient Instructions  Gout Gout is painful swelling of your joints. Gout is a type of arthritis. It is caused by having too much uric acid in your body. Uric acid is a chemical that is made when your body breaks down substances called purines. If your body has too much uric acid, sharp crystals can form and build up in your joints. This causes pain and swelling. Gout attacks can happen quickly and be very painful (acute gout). Over time, the attacks can affect more joints and happen more often (chronic gout). What are the causes? Too much uric acid in your blood. This can happen because: Your kidneys do not remove enough uric acid from your blood. Your body makes too much uric acid. You eat too many foods that are high in purines. These foods include organ meats, some seafood, and beer. Trauma or stress. What increases the risk? Having a family history of gout. Being male and middle-aged. Being male and having gone through menopause. Being very overweight  (obese). Drinking alcohol, especially beer. Not having enough water in the body (being dehydrated). Losing weight too quickly. Having an organ transplant. Having lead poisoning. Taking certain medicines. Having kidney disease. Having a skin condition called psoriasis. What are the signs or symptoms? An attack of acute gout usually happens in just one joint. The most common place is the big toe. Attacks often start at night. Other joints that may be affected include joints of the feet, ankle, knee, fingers, wrist, or elbow. Symptoms of an attack may include: Very bad pain. Warmth. Swelling. Stiffness. Shiny, red, or purple skin. Tenderness. The affected joint may be very painful to touch. Chills and fever. Chronic gout may cause symptoms more often. More joints may be involved. You may also have white or yellow lumps (tophi) on your hands or feet or in other areas near your joints. How is this treated? Treatment for this condition has two phases: treating an acute attack and preventing future attacks. Acute gout treatment may include: NSAIDs. Steroids. These are taken by mouth or injected into a joint. Colchicine. This medicine relieves pain and swelling. It can be given by mouth or through an IV tube. Preventive treatment may include: Taking small doses of NSAIDs or colchicine daily. Using a medicine that reduces uric acid levels in your blood. Making changes to your diet. You may need to see a food expert (dietitian) about what to eat and drink to prevent gout. Follow these instructions at home: During a gout attack  If told, put ice on the painful area: Put ice in a plastic bag. Place a towel between your skin and the bag. Leave the ice on for 20 minutes, 2-3 times a day. Raise (elevate) the painful joint above the level of your heart as often as you can. Rest the joint as much as possible. If the joint is in your leg, you may be given crutches. Follow instructions from your  doctor about what you cannot eat or drink. Avoiding future gout attacks  Eat a low-purine diet. Avoid foods and drinks such as: Liver. Kidney. Anchovies. Asparagus. Herring. Mushrooms. Mussels. Beer. Stay at a healthy weight. If you want to lose weight, talk with your doctor. Do not lose weight too fast. Start or continue an exercise plan as told by your doctor. Eating and drinking Drink enough fluids to keep your pee (urine) pale yellow. If you drink alcohol: Limit how much you use to: 0-1 drink a day for women. 0-2 drinks a day for men. Be aware of how much alcohol is in your drink. In the [place] one drink equals one 12 oz bottle of beer (355 mL), one 5 oz glass of wine (148 mL), or one 1 oz glass of hard liquor (44 mL). General instructions Take over-the-counter and prescription medicines only as told by your doctor. Do not drive or use heavy machinery while taking prescription pain medicine. Return to your normal activities as told by your doctor. Ask your doctor what activities are safe for you. Keep all follow-up visits as told by your doctor. This is important. Contact a doctor if: You have another gout attack. You still have symptoms of a gout attack after 10 days of treatment. You have problems (side effects) because of your medicines. You have chills or a fever. You have burning pain when you pee (urinate). You have pain in your lower back or belly. Get help right away if: You have very bad pain. Your pain cannot be controlled. You cannot pee. Summary Gout is painful swelling of the joints. The most common site of pain is the big toe, but it can affect other joints. Medicines and avoiding some foods can help to prevent and treat gout attacks. This information is not intended to replace advice given to you by your health care provider. Make sure you discuss any questions you have with your health care provider. Document Revised: 07/05/2018 Document Reviewed:  07/05/2018 Elsevier Patient Education  2022 [place], MD [facility]

## 2021-08-26 NOTE — Addendum Note (Signed)
Addended by: Durwin Nora on: 08/26/2021 03:55 PM   Modules accepted: Orders

## 2021-10-05 ENCOUNTER — Other Ambulatory Visit: Payer: Self-pay | Admitting: Emergency Medicine

## 2021-10-10 DIAGNOSIS — R42 Dizziness and giddiness: Secondary | ICD-10-CM | POA: Diagnosis not present

## 2021-10-20 ENCOUNTER — Ambulatory Visit: Payer: BLUE CROSS/BLUE SHIELD | Admitting: Internal Medicine

## 2021-10-20 ENCOUNTER — Encounter: Payer: Self-pay | Admitting: Internal Medicine

## 2021-10-20 ENCOUNTER — Ambulatory Visit (INDEPENDENT_AMBULATORY_CARE_PROVIDER_SITE_OTHER): Payer: BLUE CROSS/BLUE SHIELD

## 2021-10-20 ENCOUNTER — Other Ambulatory Visit: Payer: Self-pay

## 2021-10-20 VITALS — BP 128/88 | HR 90 | Resp 18 | Ht 72.0 in | Wt 218.2 lb

## 2021-10-20 DIAGNOSIS — M545 Low back pain, unspecified: Secondary | ICD-10-CM

## 2021-10-20 MED ORDER — METHYLPREDNISOLONE ACETATE 40 MG/ML IJ SUSP
40.0000 mg | Freq: Once | INTRAMUSCULAR | Status: AC
  Administered 2021-10-20: 40 mg via INTRAMUSCULAR

## 2021-10-20 NOTE — Progress Notes (Signed)
   Subjective:   Patient ID: Isaiah [name], male    DOB: 01-14-1961, 60 y.o.   MRN: [id number]  Back Pain Pertinent negatives include no fever, numbness or weakness.  The patient is a 60 YO man coming in for back pain.  Review of Systems  Constitutional:  Positive for activity change. Negative for appetite change, fatigue, fever and unexpected weight change.  Respiratory: Negative.    Cardiovascular: Negative.   Musculoskeletal:  Positive for back pain and myalgias. Negative for arthralgias.  Skin: Negative.   Neurological:  Negative for syncope, weakness and numbness.   Objective:  Physical Exam Constitutional:      Appearance: He is well-developed.  HENT:     Head: Normocephalic and atraumatic.  Cardiovascular:     Rate and Rhythm: Normal rate and regular rhythm.  Pulmonary:     Effort: Pulmonary effort is normal. No respiratory distress.     Breath sounds: Normal breath sounds. No wheezing or rales.  Abdominal:     General: Bowel sounds are normal. There is no distension.     Palpations: Abdomen is soft.     Tenderness: There is no abdominal tenderness. There is no rebound.  Musculoskeletal:        General: Tenderness present.     Cervical back: Normal range of motion.     Comments: Lumbar midline paraspinal  Skin:    General: Skin is warm and dry.  Neurological:     Mental Status: He is alert and oriented to person, place, and time.     Coordination: Coordination normal.    Vitals:   10/20/21 1418  BP: 128/88  Pulse: 90  Resp: 18  SpO2: 96%  Weight: 218 lb 3.2 oz (99 kg)  Height: 6' (1.829 m)    This visit occurred during the SARS-CoV-2 public health emergency.  Safety protocols were in place, including screening questions prior to the visit, additional usage of staff PPE, and extensive cleaning of exam room while observing appropriate contact time as indicated for disinfecting solutions.   Assessment & Plan:  Depo-medrol 40 mg IM given at visit

## 2021-10-20 NOTE — Patient Instructions (Signed)
We can do the x-ray today on the back.

## 2021-10-20 NOTE — Assessment & Plan Note (Addendum)
Given depo-medrol 40 mg IM and ordered x-ray lumbar today to assess given symptoms have failed to resolve within 2 weeks. Cannot take nsaids due to xarelto. Advised to use tylenol otc.

## 2021-11-05 ENCOUNTER — Telehealth: Payer: Self-pay

## 2021-11-05 NOTE — Telephone Encounter (Signed)
Patient calling requesting new order on gout medication due to current flare up.  Please send to [facility] on [address]

## 2021-11-06 ENCOUNTER — Other Ambulatory Visit: Payer: Self-pay

## 2021-11-06 DIAGNOSIS — M109 Gout, unspecified: Secondary | ICD-10-CM

## 2021-11-06 MED ORDER — COLCHICINE 0.6 MG PO TABS: ORAL_TABLET | ORAL | 1 refills | Status: DC

## 2021-11-06 NOTE — Telephone Encounter (Signed)
1.Medication Requested: colchicine 0.6 MG tablet  2. Pharmacy (Name, Street, [place]): [facility] [phone] - [place], [address]  3. On Med List: yes   4. Last Visit with PCP: 08-26-2021  5. Next visit date with PCP: n/a   Patient is also requesting a rx for prednisone

## 2021-11-09 ENCOUNTER — Telehealth: Payer: Self-pay | Admitting: Emergency Medicine

## 2021-11-09 ENCOUNTER — Other Ambulatory Visit: Payer: Self-pay | Admitting: Emergency Medicine

## 2021-11-09 MED ORDER — PREDNISONE 20 MG PO TABS: 40.0000 mg | ORAL_TABLET | Freq: Every day | ORAL | 0 refills | Status: AC

## 2021-11-09 NOTE — Telephone Encounter (Signed)
Prescription for prednisone sent to pharmacy of record.  Thanks.

## 2021-11-09 NOTE — Telephone Encounter (Signed)
1.Medication Requested: Prednisone  2. Pharmacy (Name, Street, [place]): [facility] [phone] - [place], [address] AT [address] Phone:  [phone]  Fax:  [phone]     3. On Med List: Y  4. Last Visit with PCP: 10/20/2021  5. Next visit date with PCP: n/a   Agent: Please be advised that RX refills may take up to 3 business days. We ask that you follow-up with your pharmacy.

## 2021-11-10 ENCOUNTER — Ambulatory Visit: Payer: BLUE CROSS/BLUE SHIELD | Admitting: Internal Medicine

## 2021-12-15 ENCOUNTER — Other Ambulatory Visit: Payer: Self-pay | Admitting: Emergency Medicine

## 2021-12-15 DIAGNOSIS — I10 Essential (primary) hypertension: Secondary | ICD-10-CM

## 2022-01-05 IMAGING — DX DG LUMBAR SPINE COMPLETE 4+V
5 series · 5 of 5 positions shown · non-contrast
Comparison: Lumbar spine radiograph dated 03/16/2015.

CLINICAL DATA: Back pain.

EXAM:
LUMBAR SPINE - COMPLETE 4+ VIEW

[l-spine ap]
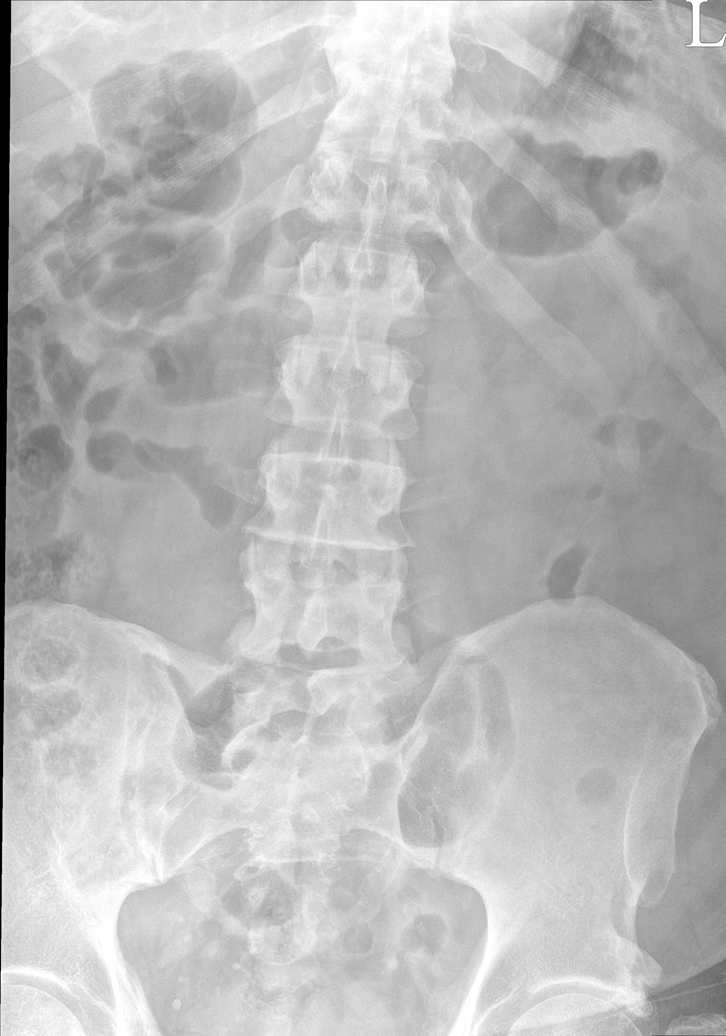

[l-spine obl (1 of 2)]
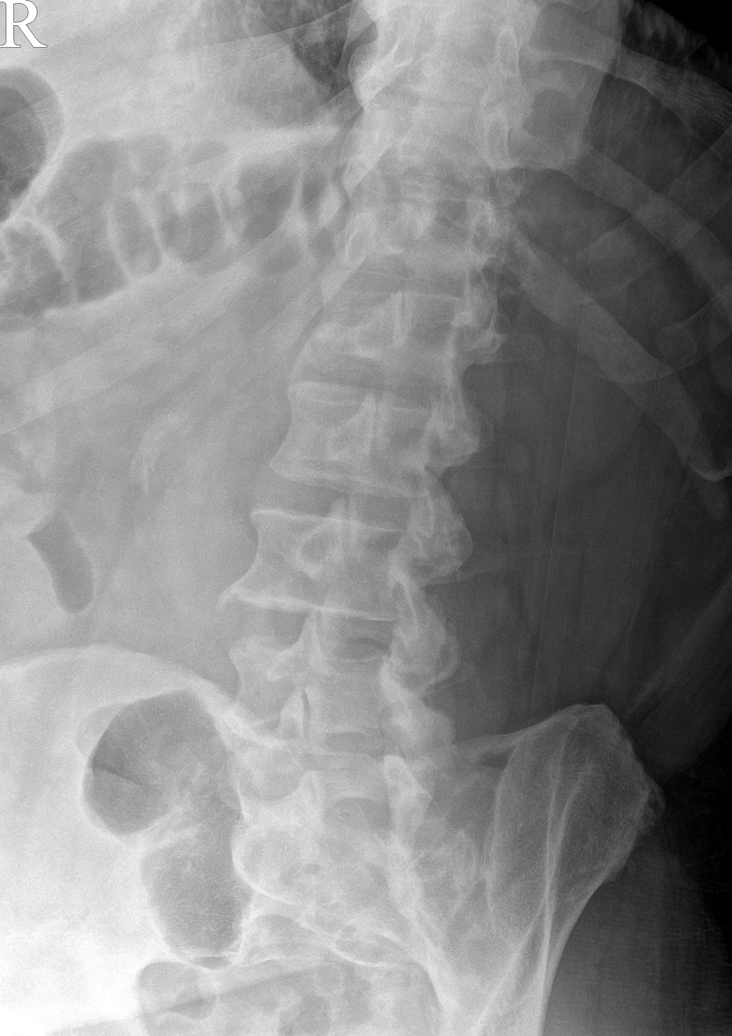

[l-spine obl (2 of 2)]
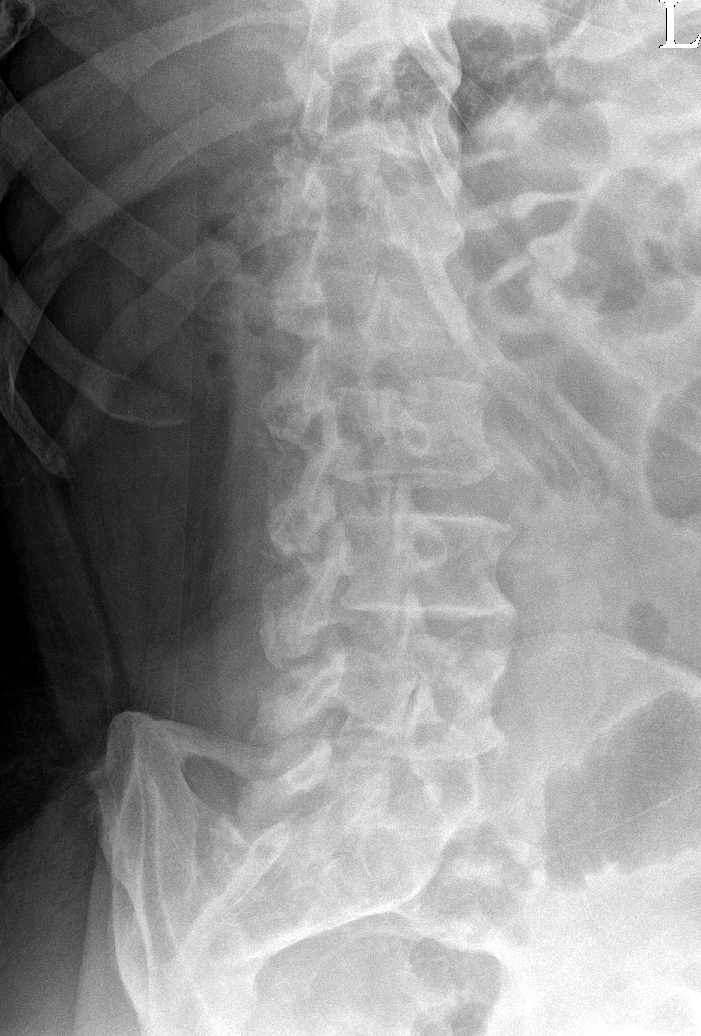

[l-spine lateral]
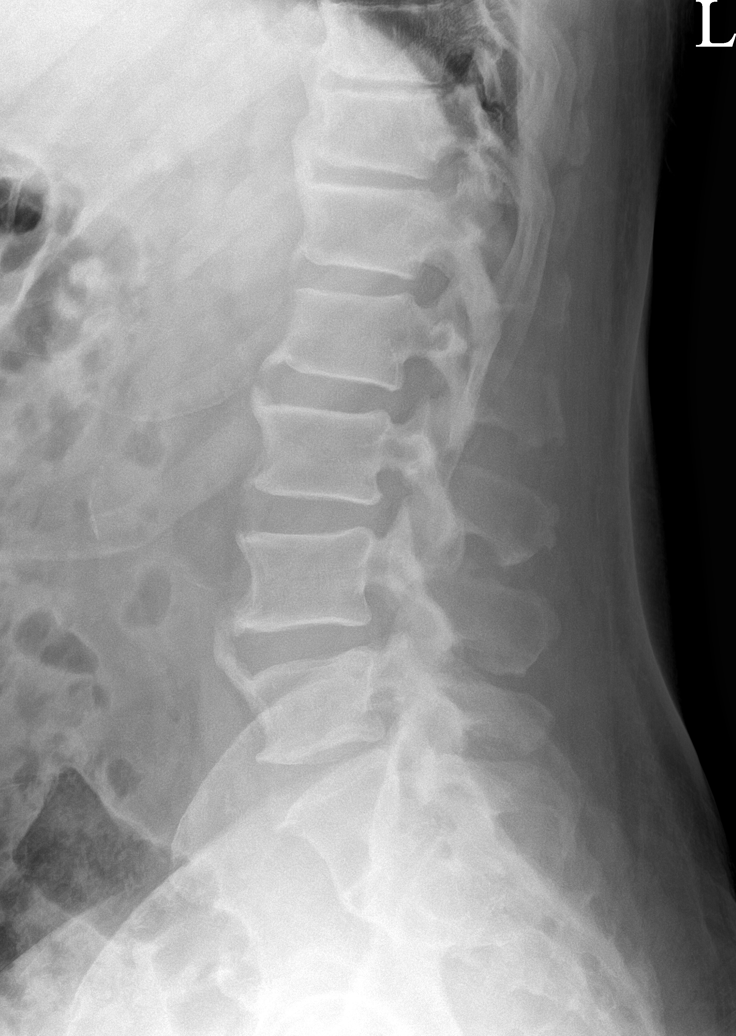

[l-spine spot]
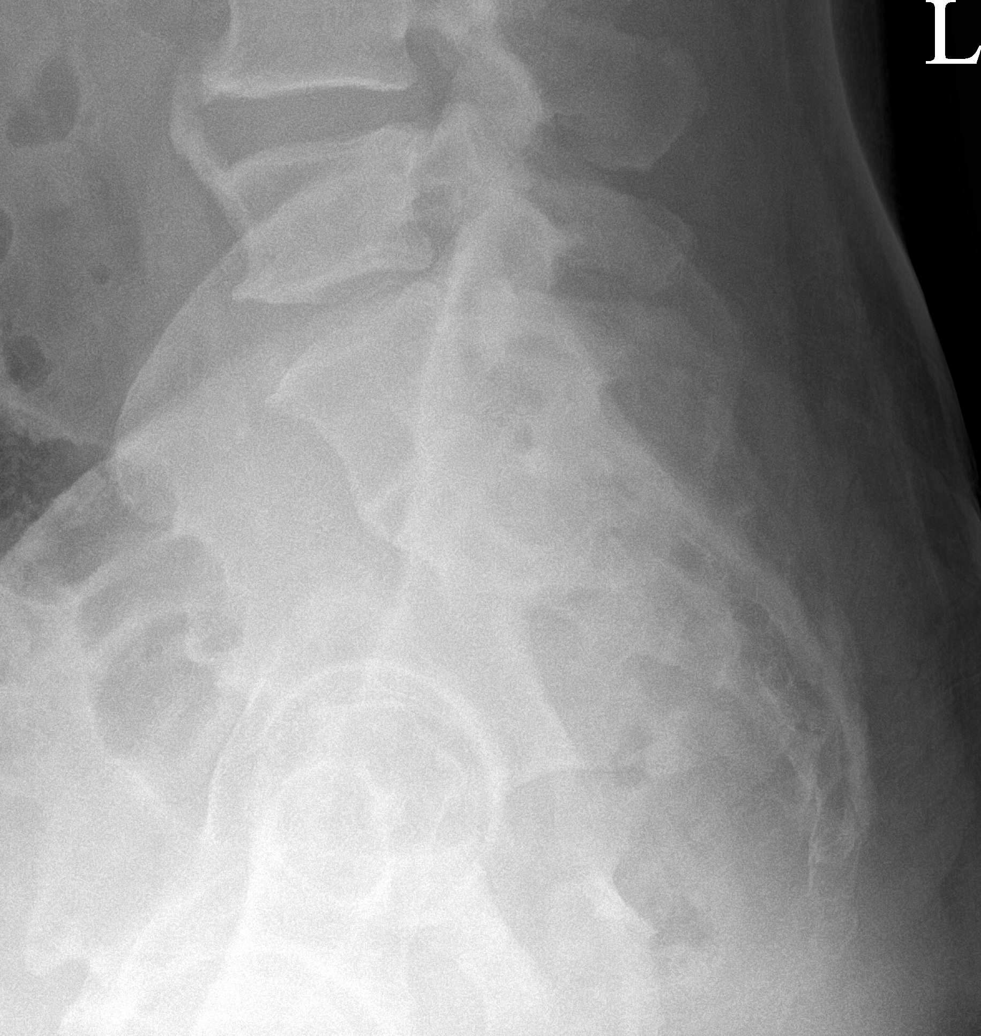

[5 of 5 positions shown; findings below may reference images not displayed]

FINDINGS: Five lumbar type vertebra. There is no acute fracture or subluxation
of the lumbar spine. Multilevel degenerative changes and anterior
osteophyte. The visualized posterior elements are intact. The soft
tissues are unremarkable.
IMPRESSION: No acute/traumatic lumbar spine pathology.

## 2022-01-20 ENCOUNTER — Ambulatory Visit: Payer: BLUE CROSS/BLUE SHIELD | Admitting: Emergency Medicine

## 2022-01-20 ENCOUNTER — Encounter: Payer: Self-pay | Admitting: Emergency Medicine

## 2022-01-20 ENCOUNTER — Other Ambulatory Visit: Payer: Self-pay

## 2022-01-20 VITALS — BP 122/78 | HR 60 | Temp 98.5°F | Ht 72.0 in | Wt 216.0 lb

## 2022-01-20 DIAGNOSIS — I4821 Permanent atrial fibrillation: Secondary | ICD-10-CM | POA: Diagnosis not present

## 2022-01-20 DIAGNOSIS — Z7901 Long term (current) use of anticoagulants: Secondary | ICD-10-CM | POA: Diagnosis not present

## 2022-01-20 DIAGNOSIS — I119 Hypertensive heart disease without heart failure: Secondary | ICD-10-CM

## 2022-01-20 DIAGNOSIS — M109 Gout, unspecified: Secondary | ICD-10-CM | POA: Diagnosis not present

## 2022-01-20 DIAGNOSIS — M940 Chondrocostal junction syndrome [Tietze]: Secondary | ICD-10-CM

## 2022-01-20 MED ORDER — COLCHICINE 0.6 MG PO TABS: ORAL_TABLET | ORAL | 1 refills | Status: DC

## 2022-01-20 MED ORDER — PREDNISONE 20 MG PO TABS: 20.0000 mg | ORAL_TABLET | Freq: Every day | ORAL | 1 refills | Status: DC

## 2022-01-20 NOTE — Progress Notes (Signed)
Isaiah [name] 61 y.o.   Chief Complaint  Patient presents with   Gout    Left foot big toe gout flare up, x 1 week. Med refill for colchicine and prednisone    HISTORY OF PRESENT ILLNESS: This is a 61 y.o. male complaining of gout flareup on left big toe for 1 week. Needs refill on colchicine prednisone.  Takes daily allopurinol 150 mg. Also has history of hypertension and chronic A. fib on beta-blocker treatment and long-term anticoagulation with Xarelto. Also started working out recently and noticed left rib cage anterior pain.  Tender to touch. No other complaints or medical concerns today.  HPI   Prior to Admission medications   Medication Sig Start Date End Date Taking? Authorizing Provider  allopurinol (ZYLOPRIM) 300 MG tablet Take 0.5 tablets (150 mg total) by mouth daily. 01/20/21  Yes Just, Laurita Quint, FNP  amLODipine (NORVASC) 10 MG tablet Take 1 tablet (10 mg total) by mouth daily. 05/29/20  Yes Posey Boyer, MD  colchicine 0.6 MG tablet Take 1.2 mg now and 0.6 mg one hour later. Take 0.6 mg daily after that x 5 days. 11/06/21  Yes [place], Ines Bloomer, MD  Nebivolol HCl 20 MG TABS TAKE 1 TABLET(20 MG) BY MOUTH DAILY FOR BLOOD PRESSURE 12/15/21  Yes Taysha Majewski, Ines Bloomer, MD  rivaroxaban (XARELTO) 20 MG TABS tablet TAKE 1 TABLET BY MOUTH DAILY WITH SUPPER 10/05/21  Yes Horald Pollen, MD    Allergies  Allergen Reactions   Codeine Itching   Ace Inhibitors Swelling    Swelling of legs    Patient Active Problem List   Diagnosis Date Noted   Acute bilateral low back pain without sciatica 10/20/2021   Chronic anticoagulation 07/28/2021   Stage 3b chronic kidney disease ([place]) 12/05/2020   Hypertensive heart disease    Dyslipidemia 02/02/2015   Obesity (BMI 30-39.9) 09/10/2013   Acute gouty arthritis 03/09/2013   Terminal aortic occlusion ([place]) 01/24/2013   Atrial fibrillation, permanent ([place]) 01/24/2013   Essential hypertension 01/24/2013   Erectile  dysfunction 01/24/2013   CAD (coronary artery disease), non obstructive by cardiac cath 2009 01/24/2013    Past Medical History:  Diagnosis Date   Atrial fibrillation, permanent ([place]) 01/24/2013   chronic,previuosly refuses coumadin,now on xarelto   CKD (chronic kidney disease) stage 3, GFR 30-59 ml/min ([place]) 01/24/2013   Baseline creatinine roughly 1.7-1.8.   Erectile dysfunction    Extremity ischemia, critical bil lower ext. 01/24/2013   Status post bilateral common femoral, profunda femoral and superficial femoral arterial embolectomy   Gout    H/O right and left cardiac catheterization November 2009   30-40% RCA disease, cardiac output Fick 6.3, thermodilution 5.3.  Normal artery pressures.   Heart murmur    History of Non-ischemic cardiomyopathy 2009 - 2014   EF previously as low as 35-40% in 2009, up to 40 and 45% by 2012.  Repeat echo January 2014: Echo EF 55-60% mild LVH, mild to moderate left atrial enlargement, mild right atrial enlargement.   History of TIA (transient ischemic attack) 2005   Carotid Dopplers January 2014 negative for significant stenosis.   Hypertension    Hypertensive heart disease    OSA on CPAP    use DME- Choice titration study was on 02/21/13   Pinched nerve in neck    Terminal aortic occlusion ([place]) 01/24/2013   Status post embolectomy    Past Surgical History:  Procedure Laterality Date   CARDIAC CATHETERIZATION  Nov 2009  right and left  heart cath ,cardiac output 6.3 by FICK AND 5.34 by thermal  diluation.norm RV pressures and nonobstructive cor disease. no shunt.some intramyocardial bridgingof the LAD   CAROTID DOPPLERS  Nov 2009   done for TIA which were normal   DOPPLER ECHOCARDIOGRAPHY  02/2011; 12/2012   a) EF40-45%; LA mod to severe dilated ;; b) EF 55-60%, mild LVH, Mild-Mod LA dilation   EMBOLECTOMY  01/23/2013   Procedure: EMBOLECTOMY;  Surgeon: Serafina Mitchell, MD;  Location: [facility] OR;  Service: Vascular;  Laterality: Bilateral;  Bilateral  femoral Embolectomy, Bilateral Iliac Embolectomy.   KNEE SURGERY      Social History   Socioeconomic History   Marital status: Divorced    Spouse name: Not on file   Number of children: Not on file   Years of education: Not on file   Highest education level: Not on file  Occupational History   Not on file  Tobacco Use   Smoking status: Never   Smokeless tobacco: Never  Substance and Sexual Activity   Alcohol use: No    Alcohol/week: 0.0 standard drinks   Drug use: No   Sexual activity: Yes    Comment: married  Other Topics Concern   Not on file  Social History Narrative   Not on file   Social Determinants of Health   Financial Resource Strain: Not on file  Food Insecurity: Not on file  Transportation Needs: Not on file  Physical Activity: Not on file  Stress: Not on file  Social Connections: Not on file  Intimate Partner Violence: Not on file    Family History  Problem Relation Age of Onset   Hypertension Mother    Stroke Mother    Hypertension Father    Stroke Father    Hypertension Sister    Hypertension Brother    Hypertension Sister      Review of Systems  Constitutional: Negative.  Negative for chills and fever.  HENT: Negative.  Negative for congestion and sore throat.   Respiratory: Negative.  Negative for cough and shortness of breath.   Cardiovascular: Negative.  Negative for chest pain and palpitations.  Gastrointestinal:  Negative for abdominal pain, nausea and vomiting.  Genitourinary: Negative.  Negative for dysuria and hematuria.  Skin: Negative.  Negative for rash.  Neurological:  Negative for dizziness and headaches.  All other systems reviewed and are negative.  Today's Vitals   01/20/22 0914  BP: 122/78  Pulse: 60  Temp: 98.5 F (36.9 C)  TempSrc: Oral  SpO2: 98%  Weight: 216 lb (98 kg)  Height: 6' (1.829 m)   Body mass index is 29.29 kg/m.  Physical Exam Constitutional:      Appearance: Normal appearance.  HENT:      Head: Normocephalic.  Eyes:     Extraocular Movements: Extraocular movements intact.     Pupils: Pupils are equal, round, and reactive to light.  Cardiovascular:     Rate and Rhythm: Normal rate. Rhythm irregular.     Heart sounds: Normal heart sounds.  Pulmonary:     Effort: Pulmonary effort is normal.     Breath sounds: Normal breath sounds.  Chest:     Chest wall: Tenderness (Left upper anterior chest close to sternum) present.  Musculoskeletal:     Comments: Left foot: Positive tenderness, swelling, and erythema to big toe metatarsophalangeal joint  Skin:    General: Skin is warm and dry.     Capillary Refill: Capillary refill takes less  than 2 seconds.  Neurological:     General: No focal deficit present.     Mental Status: He is alert and oriented to person, place, and time.  Psychiatric:        Mood and Affect: Mood normal.        Behavior: Behavior normal.     ASSESSMENT & PLAN: Problem List Items Addressed This Visit       Cardiovascular and Mediastinum   Atrial fibrillation, permanent (HCC) (Chronic)    Stable.  Well-controlled rate.  On long-term anticoagulation. Continue nebivolol and Xarelto      Hypertensive heart disease    Well-controlled hypertension.  No signs of congestive heart failure. BP Readings from Last 3 Encounters:  01/20/22 122/78  10/20/21 128/88  08/26/21 132/82   Continue nebivolol 20 mg daily and amlodipine 10 mg daily.        Musculoskeletal and Integument   Acute gouty arthritis - Primary    No acute flareup.  Responds well to colchicine and low-dose prednisone.      Relevant Medications   colchicine 0.6 MG tablet   predniSONE (DELTASONE) 20 MG tablet   Costochondritis, acute    Clinical findings compatible with mild case of costochondritis.  Prednisone and colchicine will help with the inflammation.        Other   Current use of long term anticoagulation    No clinical bleeding.  Continue Xarelto.      Patient  Instructions  Gout Gout is painful swelling of your joints. Gout is a type of arthritis. It is caused by having too much uric acid in your body. Uric acid is a chemical that is made when your body breaks down substances called purines. If your body has too much uric acid, sharp crystals can form and build up in your joints. This causes pain and swelling. Gout attacks can happen quickly and be very painful (acute gout). Over time, the attacks can affect more joints and happen more often (chronic gout). What are the causes? Too much uric acid in your blood. This can happen because: Your kidneys do not remove enough uric acid from your blood. Your body makes too much uric acid. You eat too many foods that are high in purines. These foods include organ meats, some seafood, and beer. Trauma or stress. What increases the risk? Having a family history of gout. Being male and middle-aged. Being male and having gone through menopause. Being very overweight (obese). Drinking alcohol, especially beer. Not having enough water in the body (being dehydrated). Losing weight too quickly. Having an organ transplant. Having lead poisoning. Taking certain medicines. Having kidney disease. Having a skin condition called psoriasis. What are the signs or symptoms? An attack of acute gout usually happens in just one joint. The most common place is the big toe. Attacks often start at night. Other joints that may be affected include joints of the feet, ankle, knee, fingers, wrist, or elbow. Symptoms of an attack may include: Very bad pain. Warmth. Swelling. Stiffness. Shiny, red, or purple skin. Tenderness. The affected joint may be very painful to touch. Chills and fever. Chronic gout may cause symptoms more often. More joints may be involved. You may also have white or yellow lumps (tophi) on your hands or feet or in other areas near your joints. How is this treated? Treatment for this condition has two  phases: treating an acute attack and preventing future attacks. Acute gout treatment may include: NSAIDs. Steroids. These are taken  by mouth or injected into a joint. Colchicine. This medicine relieves pain and swelling. It can be given by mouth or through an IV tube. Preventive treatment may include: Taking small doses of NSAIDs or colchicine daily. Using a medicine that reduces uric acid levels in your blood. Making changes to your diet. You may need to see a food expert (dietitian) about what to eat and drink to prevent gout. Follow these instructions at home: During a gout attack  If told, put ice on the painful area: Put ice in a plastic bag. Place a towel between your skin and the bag. Leave the ice on for 20 minutes, 2-3 times a day. Raise (elevate) the painful joint above the level of your heart as often as you can. Rest the joint as much as possible. If the joint is in your leg, you may be given crutches. Follow instructions from your doctor about what you cannot eat or drink. Avoiding future gout attacks Eat a low-purine diet. Avoid foods and drinks such as: Liver. Kidney. Anchovies. Asparagus. Herring. Mushrooms. Mussels. Beer. Stay at a healthy weight. If you want to lose weight, talk with your doctor. Do not lose weight too fast. Start or continue an exercise plan as told by your doctor. Eating and drinking Drink enough fluids to keep your pee (urine) pale yellow. If you drink alcohol: Limit how much you use to: 0-1 drink a day for women. 0-2 drinks a day for men. Be aware of how much alcohol is in your drink. In the [place] one drink equals one 12 oz bottle of beer (355 mL), one 5 oz glass of wine (148 mL), or one 1 oz glass of hard liquor (44 mL). General instructions Take over-the-counter and prescription medicines only as told by your doctor. Do not drive or use heavy machinery while taking prescription pain medicine. Return to your normal activities as told  by your doctor. Ask your doctor what activities are safe for you. Keep all follow-up visits as told by your doctor. This is important. Contact a doctor if: You have another gout attack. You still have symptoms of a gout attack after 10 days of treatment. You have problems (side effects) because of your medicines. You have chills or a fever. You have burning pain when you pee (urinate). You have pain in your lower back or belly. Get help right away if: You have very bad pain. Your pain cannot be controlled. You cannot pee. Summary Gout is painful swelling of the joints. The most common site of pain is the big toe, but it can affect other joints. Medicines and avoiding some foods can help to prevent and treat gout attacks. This information is not intended to replace advice given to you by your health care provider. Make sure you discuss any questions you have with your health care provider. Document Revised: 06/23/2018 Document Reviewed: 07/05/2018 Elsevier Patient Education  2022 [place], MD [facility]

## 2022-01-20 NOTE — Assessment & Plan Note (Signed)
No acute flareup.  Responds well to colchicine and low-dose prednisone.

## 2022-01-20 NOTE — Assessment & Plan Note (Addendum)
Well-controlled hypertension.  No signs of congestive heart failure. BP Readings from Last 3 Encounters:  01/20/22 122/78  10/20/21 128/88  08/26/21 132/82   Continue nebivolol 20 mg daily and amlodipine 10 mg daily.

## 2022-01-20 NOTE — Assessment & Plan Note (Signed)
Clinical findings compatible with mild case of costochondritis.  Prednisone and colchicine will help with the inflammation.

## 2022-01-20 NOTE — Assessment & Plan Note (Signed)
No clinical bleeding.  Continue Xarelto.

## 2022-01-20 NOTE — Assessment & Plan Note (Signed)
Stable.  Well-controlled rate.  On long-term anticoagulation. Continue nebivolol and Xarelto

## 2022-01-20 NOTE — Patient Instructions (Signed)
Gout ?Gout is painful swelling of your joints. Gout is a type of arthritis. It is caused by having too much uric acid in your body. Uric acid is a chemical that is made when your body breaks down substances called purines. If your body has too much uric acid, sharp crystals can form and build up in your joints. This causes pain and swelling. ?Gout attacks can happen quickly and be very painful (acute gout). Over time, the attacks can affect more joints and happen more often (chronic gout). ?What are the causes? ?Too much uric acid in your blood. This can happen because: ?Your kidneys do not remove enough uric acid from your blood. ?Your body makes too much uric acid. ?You eat too many foods that are high in purines. These foods include organ meats, some seafood, and beer. ?Trauma or stress. ?What increases the risk? ?Having a family history of gout. ?Being male and middle-aged. ?Being male and having gone through menopause. ?Being very overweight (obese). ?Drinking alcohol, especially beer. ?Not having enough water in the body (being dehydrated). ?Losing weight too quickly. ?Having an organ transplant. ?Having lead poisoning. ?Taking certain medicines. ?Having kidney disease. ?Having a skin condition called psoriasis. ?What are the signs or symptoms? ?An attack of acute gout usually happens in just one joint. The most common place is the big toe. Attacks often start at night. Other joints that may be affected include joints of the feet, ankle, knee, fingers, wrist, or elbow. Symptoms of an attack may include: ?Very bad pain. ?Warmth. ?Swelling. ?Stiffness. ?Shiny, red, or purple skin. ?Tenderness. The affected joint may be very painful to touch. ?Chills and fever. ?Chronic gout may cause symptoms more often. More joints may be involved. You may also have white or yellow lumps (tophi) on your hands or feet or in other areas near your joints. ?How is this treated? ?Treatment for this condition has two phases:  treating an acute attack and preventing future attacks. ?Acute gout treatment may include: ?NSAIDs. ?Steroids. These are taken by mouth or injected into a joint. ?Colchicine. This medicine relieves pain and swelling. It can be given by mouth or through an IV tube. ?Preventive treatment may include: ?Taking small doses of NSAIDs or colchicine daily. ?Using a medicine that reduces uric acid levels in your blood. ?Making changes to your diet. You may need to see a food expert (dietitian) about what to eat and drink to prevent gout. ?Follow these instructions at home: ?During a gout attack ? ?If told, put ice on the painful area: ?Put ice in a plastic bag. ?Place a towel between your skin and the bag. ?Leave the ice on for 20 minutes, 2-3 times a day. ?Raise (elevate) the painful joint above the level of your heart as often as you can. ?Rest the joint as much as possible. If the joint is in your leg, you may be given crutches. ?Follow instructions from your doctor about what you cannot eat or drink. ?Avoiding future gout attacks ?Eat a low-purine diet. Avoid foods and drinks such as: ?Liver. ?Kidney. ?Anchovies. ?Asparagus. ?Herring. ?Mushrooms. ?Mussels. ?Beer. ?Stay at a healthy weight. If you want to lose weight, talk with your doctor. Do not lose weight too fast. ?Start or continue an exercise plan as told by your doctor. ?Eating and drinking ?Drink enough fluids to keep your pee (urine) pale yellow. ?If you drink alcohol: ?Limit how much you use to: ?0-1 drink a day for women. ?0-2 drinks a day for men. ?Be aware of   how much alcohol is in your drink. In the [place] one drink equals one 12 oz bottle of beer (355 mL), one 5 oz glass of wine (148 mL), or one 1? oz glass of hard liquor (44 mL). ?General instructions ?Take over-the-counter and prescription medicines only as told by your doctor. ?Do not drive or use heavy machinery while taking prescription pain medicine. ?Return to your normal activities as told by your  doctor. Ask your doctor what activities are safe for you. ?Keep all follow-up visits as told by your doctor. This is important. ?Contact a doctor if: ?You have another gout attack. ?You still have symptoms of a gout attack after 10 days of treatment. ?You have problems (side effects) because of your medicines. ?You have chills or a fever. ?You have burning pain when you pee (urinate). ?You have pain in your lower back or belly. ?Get help right away if: ?You have very bad pain. ?Your pain cannot be controlled. ?You cannot pee. ?Summary ?Gout is painful swelling of the joints. ?The most common site of pain is the big toe, but it can affect other joints. ?Medicines and avoiding some foods can help to prevent and treat gout attacks. ?This information is not intended to replace advice given to you by your health care provider. Make sure you discuss any questions you have with your health care provider. ?Document Revised: 06/23/2018 Document Reviewed: 07/05/2018 ?Elsevier Patient Education ? 2022 Elsevier Inc[employer] ? ?

## 2022-04-05 ENCOUNTER — Other Ambulatory Visit: Payer: Self-pay | Admitting: Emergency Medicine

## 2022-04-05 DIAGNOSIS — M109 Gout, unspecified: Secondary | ICD-10-CM

## 2022-04-06 NOTE — Progress Notes (Signed)
? ? ?  Subjective:  ? ? Patient ID: Isaiah [name], male    DOB: 1961/03/11, 61 y.o.   MRN: [id number] ? ?This visit occurred during the SARS-CoV-2 public health emergency.  Safety protocols were in place, including screening questions prior to the visit, additional usage of staff PPE, and extensive cleaning of exam room while observing appropriate contact time as indicated for disinfecting solutions. ? ? ? ?HPI ?Donevan is here for  ?Chief Complaint  ?Patient presents with  ? Gout  ?  Left knee  ? ? ? ?Gout in left knee - started last Thursday, 6 days ago.  It is his left knee.  He gets a flare every 2-3 months.  It has been in several different joints over the years.  He thinks it is related to what he is eating. Symptoms typical of usual gout flare - pain, swelling, warmth and redness.  He states it hurts to walk.  ? ?Started the prednisone and cholchine yesterday and he feels his symptoms are already a little better.    ? ?No fever/chills. No calf pain.   No other joint pain.  ? ?He has not been taking the allopurinol.  ? ? ?Medications and allergies reviewed with patient and updated if appropriate. ? ?Current Outpatient Medications on File Prior to Visit  ?Medication Sig Dispense Refill  ? amLODipine (NORVASC) 10 MG tablet Take 1 tablet (10 mg total) by mouth daily. 30 tablet 5  ? colchicine 0.6 MG tablet TAKE 2 TABLETS BY MOUTH NOW, THEN 1 TABLET ONE HOUR LATER, TAKE 1 DAILY AFTER THAT FOR 5 DAYS 20 tablet 1  ? Nebivolol HCl 20 MG TABS TAKE 1 TABLET(20 MG) BY MOUTH DAILY FOR BLOOD PRESSURE 90 tablet 1  ? predniSONE (DELTASONE) 20 MG tablet TAKE 1 TABLET(20 MG) BY MOUTH DAILY WITH BREAKFAST FOR 5 DAYS 5 tablet 1  ? rivaroxaban (XARELTO) 20 MG TABS tablet TAKE 1 TABLET BY MOUTH DAILY WITH SUPPER 90 tablet 3  ? ?No current facility-administered medications on file prior to visit.  ? ? ?Review of Systems ? ?   ?Objective:  ? ?Vitals:  ? 04/07/22 0807  ?BP: 140/74  ?Pulse: 70  ?Temp: 98 ?F (36.7 ?C)  ?SpO2: 99%  ? ?BP  Readings from Last 3 Encounters:  ?04/07/22 140/74  ?01/20/22 122/78  ?10/20/21 128/88  ? ?Wt Readings from Last 3 Encounters:  ?04/07/22 218 lb 12.8 oz (99.2 kg)  ?01/20/22 216 lb (98 kg)  ?10/20/21 218 lb 3.2 oz (99 kg)  ? ?Body mass index is 29.67 kg/m?. ? ?  ?Physical Exam ?Constitutional:   ?   General: He is not in acute distress. ?   Appearance: Normal appearance.  ?HENT:  ?   Head: Normocephalic and atraumatic.  ?Musculoskeletal:  ?   Comments: Left knee with mild swelling/effusion in lower part of knee, warmth, tenderness and mild redness.  No calf tenderness or swelling  ?Skin: ?   General: Skin is warm and dry.  ?Neurological:  ?   Mental Status: He is alert.  ?   Sensory: No sensory deficit.  ? ?   ? ? ? ? ? ?Assessment & Plan:  ? ? ?See Problem List for Assessment and Plan of chronic medical problems.  ? ? ? ? ?

## 2022-04-07 ENCOUNTER — Ambulatory Visit: Payer: BLUE CROSS/BLUE SHIELD | Admitting: Internal Medicine

## 2022-04-07 ENCOUNTER — Encounter: Payer: Self-pay | Admitting: Internal Medicine

## 2022-04-07 DIAGNOSIS — M109 Gout, unspecified: Secondary | ICD-10-CM

## 2022-04-07 MED ORDER — ALLOPURINOL 300 MG PO TABS: 150.0000 mg | ORAL_TABLET | Freq: Every day | ORAL | 1 refills | Status: DC

## 2022-04-07 NOTE — Assessment & Plan Note (Addendum)
Acute gout flare - started flare 6 days ago ?I prescribed the colchicine and prednisone yesterday and he started it yesterday - symptoms are getting better - will complete the course ?Having frequent flares - likely related to CKD, maybe what he is eating ?Not taking allopurinol ?Restart allopurinol 150 mg daily once current flare has resolved - stressed he needs to take this on a regular basis for prevention ?Uric acid level, a1c, cmp today ?He will call if symptoms do not resolve completely ?

## 2022-04-07 NOTE — Patient Instructions (Signed)
? ? ? ?Blood work was ordered.   ? ? ?Medications changes include :   start the allopurinol 150 mg daily after your gout flare resolves ? ? ?Your prescription(s) have been sent to your pharmacy.  ? ? ? ?Gout ?Gout is a condition that causes painful swelling of the joints. Gout is a type of inflammation of the joints (arthritis). This condition is caused by having too much uric acid in the body. Uric acid is a chemical that forms when the body breaks down substances called purines. Purines are important for building body proteins. ?When the body has too much uric acid, sharp crystals can form and build up inside the joints. This causes pain and swelling. Gout attacks can happen quickly and may be very painful (acute gout). Over time, the attacks can affect more joints and become more frequent (chronic gout). Gout can also cause uric acid to build up under the skin and inside the kidneys. ?What are the causes? ?This condition is caused by too much uric acid in your blood. This can happen because: ?Your kidneys do not remove enough uric acid from your blood. This is the most common cause. ?Your body makes too much uric acid. This can happen with some cancers and cancer treatments. It can also occur if your body is breaking down too many red blood cells (hemolytic anemia). ?You eat too many foods that are high in purines. These foods include organ meats and some seafood. Alcohol, especially beer, is also high in purines. ?A gout attack may be triggered by trauma or stress. ?What increases the risk? ?You are more likely to develop this condition if you: ?Have a family history of gout. ?Are male and middle-aged. ?Are male and have gone through menopause. ?Are obese. ?Frequently drink alcohol, especially beer. ?Are dehydrated. ?Lose weight too quickly. ?Have an organ transplant. ?Have lead poisoning. ?Take certain medicines, including aspirin, cyclosporine, diuretics, levodopa, and niacin. ?Have kidney disease. ?Have a  skin condition called psoriasis. ?What are the signs or symptoms? ?An attack of acute gout happens quickly. It usually occurs in just one joint. The most common place is the big toe. Attacks often start at night. Other joints that may be affected include joints of the feet, ankle, knee, fingers, wrist, or elbow. Symptoms of this condition may include: ?Severe pain. ?Warmth. ?Swelling. ?Stiffness. ?Tenderness. The affected joint may be very painful to touch. ?Shiny, red, or purple skin. ?Chills and fever. ?Chronic gout may cause symptoms more frequently. More joints may be involved. You may also have white or yellow lumps (tophi) on your hands or feet or in other areas near your joints. ?How is this diagnosed? ?This condition is diagnosed based on your symptoms, medical history, and physical exam. You may have tests, such as: ?Blood tests to measure uric acid levels. ?Removal of joint fluid with a thin needle (aspiration) to look for uric acid crystals. ?X-rays to look for joint damage. ?How is this treated? ?Treatment for this condition has two phases: treating an acute attack and preventing future attacks. Acute gout treatment may include medicines to reduce pain and swelling, including: ?NSAIDs. ?Steroids. These are strong anti-inflammatory medicines that can be taken by mouth (orally) or injected into a joint. ?Colchicine. This medicine relieves pain and swelling when it is taken soon after an attack. It can be given by mouth or through an IV. ?Preventive treatment may include: ?Daily use of smaller doses of NSAIDs or colchicine. ?Use of a medicine that reduces uric acid  levels in your blood. ?Changes to your diet. You may need to see a dietitian about what to eat and drink to prevent gout. ?Follow these instructions at home: ?During a gout attack ? ?If directed, put ice on the affected area: ?Put ice in a plastic bag. ?Place a towel between your skin and the bag. ?Leave the ice on for 20 minutes, 2-3 times a  day. ?Raise (elevate) the affected joint above the level of your heart as often as possible. ?Rest the joint as much as possible. If the affected joint is in your leg, you may be given crutches to use. ?Follow instructions from your health care provider about eating or drinking restrictions. ?Avoiding future gout attacks ?Follow a low-purine diet as told by your dietitian or health care provider. Avoid foods and drinks that are high in purines, including liver, kidney, anchovies, asparagus, herring, mushrooms, mussels, and beer. ?Maintain a healthy weight or lose weight if you are overweight. If you want to lose weight, talk with your health care provider. It is important that you do not lose weight too quickly. ?Start or maintain an exercise program as told by your health care provider. ?Eating and drinking ?Drink enough fluids to keep your urine pale yellow. ?If you drink alcohol: ?Limit how much you use to: ?0-1 drink a day for women. ?0-2 drinks a day for men. ?Be aware of how much alcohol is in your drink. In the [place] one drink equals one 12 oz bottle of beer (355 mL) one 5 oz glass of wine (148 mL), or one 1? oz glass of hard liquor (44 mL). ?General instructions ?Take over-the-counter and prescription medicines only as told by your health care provider. ?Do not drive or use heavy machinery while taking prescription pain medicine. ?Return to your normal activities as told by your health care provider. Ask your health care provider what activities are safe for you. ?Keep all follow-up visits as told by your health care provider. This is important. ?Contact a health care provider if you have: ?Another gout attack. ?Continuing symptoms of a gout attack after 10 days of treatment. ?Side effects from your medicines. ?Chills or a fever. ?Burning pain when you urinate. ?Pain in your lower back or belly. ?Get help right away if you: ?Have severe or uncontrolled pain. ?Cannot urinate. ?Summary ?Gout is painful  swelling of the joints caused by inflammation. ?The most common site of pain is the big toe, but it can affect other joints in the body. ?Medicines and dietary changes can help to prevent and treat gout attacks. ?This information is not intended to replace advice given to you by your health care provider. Make sure you discuss any questions you have with your health care provider. ?Document Revised: 06/23/2018 Document Reviewed: 07/05/2018 ?Elsevier Patient Education ? [place]. ? ?

## 2022-06-18 ENCOUNTER — Other Ambulatory Visit: Payer: Self-pay

## 2022-06-18 ENCOUNTER — Telehealth: Payer: Self-pay | Admitting: Emergency Medicine

## 2022-06-18 DIAGNOSIS — M109 Gout, unspecified: Secondary | ICD-10-CM

## 2022-06-18 MED ORDER — COLCHICINE 0.6 MG PO TABS: ORAL_TABLET | ORAL | 1 refills | Status: DC

## 2022-06-18 MED ORDER — PREDNISONE 20 MG PO TABS: ORAL_TABLET | ORAL | 1 refills | Status: DC

## 2022-06-22 ENCOUNTER — Other Ambulatory Visit: Payer: BLUE CROSS/BLUE SHIELD

## 2022-06-22 ENCOUNTER — Ambulatory Visit: Payer: BLUE CROSS/BLUE SHIELD | Admitting: Emergency Medicine

## 2022-06-22 ENCOUNTER — Encounter: Payer: Self-pay | Admitting: Emergency Medicine

## 2022-06-22 VITALS — BP 138/88 | HR 74 | Temp 98.9°F | Ht 73.5 in | Wt 215.2 lb

## 2022-06-22 DIAGNOSIS — I2510 Atherosclerotic heart disease of native coronary artery without angina pectoris: Secondary | ICD-10-CM

## 2022-06-22 DIAGNOSIS — I10 Essential (primary) hypertension: Secondary | ICD-10-CM

## 2022-06-22 DIAGNOSIS — Z1211 Encounter for screening for malignant neoplasm of colon: Secondary | ICD-10-CM

## 2022-06-22 DIAGNOSIS — I119 Hypertensive heart disease without heart failure: Secondary | ICD-10-CM | POA: Diagnosis not present

## 2022-06-22 DIAGNOSIS — I428 Other cardiomyopathies: Secondary | ICD-10-CM

## 2022-06-22 DIAGNOSIS — N1832 Chronic kidney disease, stage 3b: Secondary | ICD-10-CM

## 2022-06-22 DIAGNOSIS — Z7901 Long term (current) use of anticoagulants: Secondary | ICD-10-CM

## 2022-06-22 DIAGNOSIS — I4821 Permanent atrial fibrillation: Secondary | ICD-10-CM

## 2022-06-22 LAB — CBC WITH DIFFERENTIAL/PLATELET
Basophils Absolute: 0 10*3/uL (ref 0.0–0.1)
Basophils Relative: 0.5 % (ref 0.0–3.0)
Eosinophils Absolute: 0 10*3/uL (ref 0.0–0.7)
Eosinophils Relative: 0.1 % (ref 0.0–5.0)
HCT: 40.6 % (ref 39.0–52.0)
Hemoglobin: 12.8 g/dL — ABNORMAL LOW (ref 13.0–17.0)
Lymphocytes Relative: 14.3 % (ref 12.0–46.0)
Lymphs Abs: 0.8 10*3/uL (ref 0.7–4.0)
MCHC: 31.6 g/dL (ref 30.0–36.0)
MCV: 101.9 fl — ABNORMAL HIGH (ref 78.0–100.0)
Monocytes Absolute: 0.2 10*3/uL (ref 0.1–1.0)
Monocytes Relative: 3.3 % (ref 3.0–12.0)
Neutro Abs: 4.7 10*3/uL (ref 1.4–7.7)
Neutrophils Relative %: 81.8 % — ABNORMAL HIGH (ref 43.0–77.0)
Platelets: 201 10*3/uL (ref 150.0–400.0)
RBC: 3.98 Mil/uL — ABNORMAL LOW (ref 4.22–5.81)
RDW: 13.2 % (ref 11.5–15.5)
WBC: 5.8 10*3/uL (ref 4.0–10.5)

## 2022-06-22 LAB — COMPREHENSIVE METABOLIC PANEL
ALT: 28 U/L (ref 0–53)
AST: 26 U/L (ref 0–37)
Albumin: 3.9 g/dL (ref 3.5–5.2)
Alkaline Phosphatase: 147 U/L — ABNORMAL HIGH (ref 39–117)
BUN: 42 mg/dL — ABNORMAL HIGH (ref 6–23)
CO2: 25 mEq/L (ref 19–32)
Calcium: 10.6 mg/dL — ABNORMAL HIGH (ref 8.4–10.5)
Chloride: 103 mEq/L (ref 96–112)
Creatinine, Ser: 3.23 mg/dL — ABNORMAL HIGH (ref 0.40–1.50)
GFR: 20.1 mL/min — ABNORMAL LOW (ref >60.00)
Glucose, Bld: 115 mg/dL — ABNORMAL HIGH (ref 70–99)
Potassium: 3.8 mEq/L (ref 3.5–5.1)
Sodium: 135 mEq/L (ref 135–145)
Total Bilirubin: 0.5 mg/dL (ref 0.2–1.2)
Total Protein: 7.1 g/dL (ref 6.0–8.3)

## 2022-06-22 LAB — LIPID PANEL
Cholesterol: 181 mg/dL (ref 0–200)
HDL: 54.1 mg/dL (ref >39.00)
LDL Cholesterol: 108 mg/dL — ABNORMAL HIGH (ref 0–99)
NonHDL: 126.63
Total CHOL/HDL Ratio: 3
Triglycerides: 94 mg/dL (ref 0.0–149.0)
VLDL: 18.8 mg/dL (ref 0.0–40.0)

## 2022-06-22 MED ORDER — ROSUVASTATIN CALCIUM 10 MG PO TABS: 10.0000 mg | ORAL_TABLET | Freq: Every day | ORAL | 3 refills | Status: DC

## 2022-06-22 NOTE — Assessment & Plan Note (Signed)
Recommend to start rosuvastatin 10 mg daily. Blood work including lipid profile done today. Diet and nutrition discussed.

## 2022-06-22 NOTE — Assessment & Plan Note (Signed)
>>  ASSESSMENT AND PLAN FOR STAGE 3B CHRONIC KIDNEY DISEASE (HCC) WRITTEN ON 06/22/2022  9:46 AM BY SAGARDIA, MIGUEL JOSE, MD  Advised to stay well-hydrated and avoid NSAIDs.

## 2022-06-22 NOTE — Progress Notes (Signed)
Isaiah [name] 61 y.o.   Chief Complaint  Patient presents with   Follow-up    6 month no concerns     HISTORY OF PRESENT ILLNESS: This is a 61 y.o. male A1A here for 68-month follow-up of chronic medical problems. #1 persistent atrial fibrillation for many years.  On nebivolol 20 mg daily on Xarelto #2 nonischemic cardiomyopathy #3 history of hypertension #4 atherosclerotic cardiovascular disease #5 history of gout Doing well.  Exercises regularly.  No episodes of angina or dyspnea on exertion. Non-smoker. Has not seen cardiologist for follow-up in a long while.  HPI   Prior to Admission medications   Medication Sig Start Date End Date Taking? Authorizing Provider  allopurinol (ZYLOPRIM) 300 MG tablet Take 0.5 tablets (150 mg total) by mouth daily. 04/07/22  Yes Burns, Bobette Mo, MD  amLODipine (NORVASC) 10 MG tablet Take 1 tablet (10 mg total) by mouth daily. 05/29/20  Yes Peyton Najjar, MD  colchicine 0.6 MG tablet TAKE 2 TABLETS BY MOUTH NOW, THEN 1 TABLET ONE HOUR LATER, TAKE 1 DAILY AFTER THAT FOR 5 DAYS 06/18/22  Yes Alexandria Shiflett, Eilleen Kempf, MD  Nebivolol HCl 20 MG TABS TAKE 1 TABLET(20 MG) BY MOUTH DAILY FOR BLOOD PRESSURE 12/15/21  Yes Deshan Hemmelgarn, Eilleen Kempf, MD  predniSONE (DELTASONE) 20 MG tablet TAKE 1 TABLET(20 MG) BY MOUTH DAILY WITH BREAKFAST FOR 5 DAYS 06/18/22  Yes Kamani Magnussen, Eilleen Kempf, MD  rivaroxaban (XARELTO) 20 MG TABS tablet TAKE 1 TABLET BY MOUTH DAILY WITH SUPPER 10/05/21  Yes Georgina Quint, MD    Allergies  Allergen Reactions   Codeine Itching   Ace Inhibitors Swelling    Swelling of legs    Patient Active Problem List   Diagnosis Date Noted   Costochondritis, acute 01/20/2022   Current use of long term anticoagulation 07/28/2021   Stage 3b chronic kidney disease (HCC) 12/05/2020   Hypertensive heart disease    Dyslipidemia 02/02/2015   Obesity (BMI 30-39.9) 09/10/2013   Acute gouty arthritis 03/09/2013   Terminal aortic occlusion (HCC)  01/24/2013   Atrial fibrillation, permanent (HCC) 01/24/2013   Essential hypertension 01/24/2013   Erectile dysfunction 01/24/2013   CAD (coronary artery disease), non obstructive by cardiac cath 2009 01/24/2013    Past Medical History:  Diagnosis Date   Atrial fibrillation, permanent (HCC) 01/24/2013   chronic,previuosly refuses coumadin,now on xarelto   CKD (chronic kidney disease) stage 3, GFR 30-59 ml/min (HCC) 01/24/2013   Baseline creatinine roughly 1.7-1.8.   Erectile dysfunction    Extremity ischemia, critical bil lower ext. 01/24/2013   Status post bilateral common femoral, profunda femoral and superficial femoral arterial embolectomy   Gout    H/O right and left cardiac catheterization November 2009   30-40% RCA disease, cardiac output Fick 6.3, thermodilution 5.3.  Normal artery pressures.   Heart murmur    History of Non-ischemic cardiomyopathy 2009 - 2014   EF previously as low as 35-40% in 2009, up to 40 and 45% by 2012.  Repeat echo January 2014: Echo EF 55-60% mild LVH, mild to moderate left atrial enlargement, mild right atrial enlargement.   History of TIA (transient ischemic attack) 2005   Carotid Dopplers January 2014 negative for significant stenosis.   Hypertension    Hypertensive heart disease    OSA on CPAP    use DME- Choice titration study was on 02/21/13   Pinched nerve in neck    Terminal aortic occlusion (HCC) 01/24/2013   Status post embolectomy  Past Surgical History:  Procedure Laterality Date   CARDIAC CATHETERIZATION  Nov 2009   right and left  heart cath ,cardiac output 6.3 by FICK AND 5.34 by thermal  diluation.norm RV pressures and nonobstructive cor disease. no shunt.some intramyocardial bridgingof the LAD   CAROTID DOPPLERS  Nov 2009   done for TIA which were normal   DOPPLER ECHOCARDIOGRAPHY  02/2011; 12/2012   a) EF40-45%; LA mod to severe dilated ;; b) EF 55-60%, mild LVH, Mild-Mod LA dilation   EMBOLECTOMY  01/23/2013   Procedure:  EMBOLECTOMY;  Surgeon: Nada Libman, MD;  Location: [facility] OR;  Service: Vascular;  Laterality: Bilateral;  Bilateral femoral Embolectomy, Bilateral Iliac Embolectomy.   KNEE SURGERY      Social History   Socioeconomic History   Marital status: Divorced    Spouse name: Not on file   Number of children: Not on file   Years of education: Not on file   Highest education level: Not on file  Occupational History   Not on file  Tobacco Use   Smoking status: Never   Smokeless tobacco: Never  Substance and Sexual Activity   Alcohol use: No    Alcohol/week: 0.0 standard drinks of alcohol   Drug use: No   Sexual activity: Yes    Comment: married  Other Topics Concern   Not on file  Social History Narrative   Not on file   Social Determinants of Health   Financial Resource Strain: Not on file  Food Insecurity: Not on file  Transportation Needs: Not on file  Physical Activity: Not on file  Stress: Not on file  Social Connections: Not on file  Intimate Partner Violence: Not on file    Family History  Problem Relation Age of Onset   Hypertension Mother    Stroke Mother    Hypertension Father    Stroke Father    Hypertension Sister    Hypertension Brother    Hypertension Sister      Review of Systems  Constitutional: Negative.  Negative for chills and fever.  HENT: Negative.  Negative for congestion and sore throat.   Respiratory: Negative.  Negative for cough and shortness of breath.   Cardiovascular: Negative.  Negative for chest pain and palpitations.  Gastrointestinal: Negative.  Negative for abdominal pain, diarrhea, nausea and vomiting.  Genitourinary: Negative.   Skin: Negative.  Negative for rash.  Neurological: Negative.  Negative for dizziness and headaches.  All other systems reviewed and are negative.  Today's Vitals   06/22/22 0912 06/22/22 0921  BP: (!) 142/88 138/88  Pulse: 74   Temp: 98.9 F (37.2 C)   TempSrc: Oral   SpO2: 96%   Weight: 215 lb 4  oz (97.6 kg)   Height: 6' 1.5" (1.867 m)    Body mass index is 28.01 kg/m.   Physical Exam Vitals reviewed.  Constitutional:      Appearance: Normal appearance.  HENT:     Head: Normocephalic.     Mouth/Throat:     Mouth: Mucous membranes are moist.     Pharynx: Oropharynx is clear.  Eyes:     Extraocular Movements: Extraocular movements intact.     Conjunctiva/sclera: Conjunctivae normal.     Pupils: Pupils are equal, round, and reactive to light.  Cardiovascular:     Rate and Rhythm: Normal rate. Rhythm irregular.     Pulses: Normal pulses.     Heart sounds: Normal heart sounds.  Pulmonary:     Effort: Pulmonary  effort is normal.     Breath sounds: Normal breath sounds.  Abdominal:     Palpations: Abdomen is soft.     Tenderness: There is no abdominal tenderness.  Musculoskeletal:     Cervical back: No tenderness.     Right lower leg: No edema.     Left lower leg: No edema.  Lymphadenopathy:     Cervical: No cervical adenopathy.  Skin:    General: Skin is warm and dry.     Capillary Refill: Capillary refill takes less than 2 seconds.  Neurological:     General: No focal deficit present.     Mental Status: He is alert and oriented to person, place, and time.  Psychiatric:        Mood and Affect: Mood normal.        Behavior: Behavior normal.     ASSESSMENT & PLAN: A total of 47 minutes was spent with the patient and counseling/coordination of care regarding preparing for this visit, review of most recent office visit notes, review of multiple chronic medical problems and their management, review of all medications and changes made, cardiovascular risks associated with hypertension and atrial fibrillation, need for long-term anticoagulation with Xarelto, fall precautions, need to follow-up with cardiologist, prognosis, documentation and need for follow-up.  Problem List Items Addressed This Visit       Cardiovascular and Mediastinum   Atrial fibrillation,  permanent (HCC) - Primary (Chronic)    Well-controlled rate on Bystolic 20 mg daily.  Asymptomatic.  Not affecting quality of life. Able to exercise regularly. Continue long-term anticoagulation with Xarelto 20 mg daily. Needs follow-up with cardiology.  Referral placed today.      Relevant Medications   rosuvastatin (CRESTOR) 10 MG tablet   Other Relevant Orders   Ambulatory referral to Cardiology   Essential hypertension (Chronic)    Well-controlled hypertension with normal blood pressure readings at home. Continue Bystolic 20 mg daily. Dietary approaches to stop hypertension discussed.      Relevant Medications   rosuvastatin (CRESTOR) 10 MG tablet   Atherosclerotic cardiovascular disease    Recommend to start rosuvastatin 10 mg daily. Blood work including lipid profile done today. Diet and nutrition discussed.      Relevant Medications   rosuvastatin (CRESTOR) 10 MG tablet   Other Relevant Orders   Hemoglobin A1c   Lipid panel   Hypertensive heart disease   Relevant Medications   rosuvastatin (CRESTOR) 10 MG tablet   Other Relevant Orders   Ambulatory referral to Cardiology   CBC with Differential/Platelet   Comprehensive metabolic panel     Genitourinary   Stage 3b chronic kidney disease (HCC)    Advised to stay well-hydrated and avoid NSAIDs.        Other   Current use of long term anticoagulation    Continue Xarelto 20 mg daily.  Fall precautions given.      Other Visit Diagnoses     Nonischemic cardiomyopathy (HCC)       Relevant Medications   rosuvastatin (CRESTOR) 10 MG tablet   Other Relevant Orders   Ambulatory referral to Cardiology   Colon cancer screening       Relevant Orders   Cologuard        Patient Instructions  Atrial Fibrillation  Atrial fibrillation is a type of heartbeat that is irregular or fast. If you have this condition, your heart beats without any order. This makes it hard for your heart to pump blood in a normal  way. Atrial fibrillation may come and go, or it may become a long-lasting problem. If this condition is not treated, it can put you at higher risk for stroke, heart failure, and other heart problems. What are the causes? This condition may be caused by diseases that damage the heart. They include: High blood pressure. Heart failure. Heart valve disease. Heart surgery. Other causes include: Diabetes. Thyroid disease. Being overweight. Kidney disease. Sometimes the cause is not known. What increases the risk? You are more likely to develop this condition if: You are older. You smoke. You exercise often and very hard. You have a family history of this condition. You are a man. You use drugs. You drink a lot of alcohol. You have lung conditions, such as emphysema, pneumonia, or COPD. You have sleep apnea. What are the signs or symptoms? Common symptoms of this condition include: A feeling that your heart is beating very fast. Chest pain or discomfort. Feeling short of breath. Suddenly feeling light-headed or weak. Getting tired easily during activity. Fainting. Sweating. In some cases, there are no symptoms. How is this treated? Treatment for this condition depends on underlying conditions and how you feel when you have atrial fibrillation. They include: Medicines to: Prevent blood clots. Treat heart rate or heart rhythm problems. Using devices, such as a pacemaker, to correct heart rhythm problems. Doing surgery to remove the part of the heart that sends bad signals. Closing an area where clots can form in the heart (left atrial appendage). In some cases, your doctor will treat other underlying conditions. Follow these instructions at home: Medicines Take over-the-counter and prescription medicines only as told by your doctor. Do not take any new medicines without first talking to your doctor. If you are taking blood thinners: Talk with your doctor before you take any  medicines that have aspirin or NSAIDs, such as ibuprofen, in them. Take your medicine exactly as told by your doctor. Take it at the same time each day. Avoid activities that could hurt or bruise you. Follow instructions about how to prevent falls. Wear a bracelet that says you are taking blood thinners. Or, carry a card that lists what medicines you take. Lifestyle     Do not use any products that have nicotine or tobacco in them. These include cigarettes, e-cigarettes, and chewing tobacco. If you need help quitting, ask your doctor. Eat heart-healthy foods. Talk with your doctor about the right eating plan for you. Exercise regularly as told by your doctor. Do not drink alcohol. Lose weight if you are overweight. Do not use drugs, including cannabis. General instructions If you have a condition that causes breathing to stop for a short period of time (apnea), treat it as told by your doctor. Keep a healthy weight. Do not use diet pills unless your doctor says they are safe for you. Diet pills may make heart problems worse. Keep all follow-up visits as told by your doctor. This is important. Contact a doctor if: You notice a change in the speed, rhythm, or strength of your heartbeat. You are taking a blood-thinning medicine and you get more bruising. You get tired more easily when you move or exercise. You have a sudden change in weight. Get help right away if:  You have pain in your chest or your belly (abdomen). You have trouble breathing. You have side effects of blood thinners, such as blood in your vomit, poop (stool), or pee (urine), or bleeding that cannot stop. You have any signs of a  stroke. "BE FAST" is an easy way to remember the main warning signs: B - Balance. Signs are dizziness, sudden trouble walking, or loss of balance. E - Eyes. Signs are trouble seeing or a change in how you see. F - Face. Signs are sudden weakness or loss of feeling in the face, or the face or  eyelid drooping on one side. A - Arms. Signs are weakness or loss of feeling in an arm. This happens suddenly and usually on one side of the body. S - Speech. Signs are sudden trouble speaking, slurred speech, or trouble understanding what people say. T - Time. Time to call emergency services. Write down what time symptoms started. You have other signs of a stroke, such as: A sudden, very bad headache with no known cause. Feeling like you may vomit (nausea). Vomiting. A seizure. These symptoms may be an emergency. Do not wait to see if the symptoms will go away. Get medical help right away. Call your local emergency services (911 in the [place] Do not drive yourself to the hospital. Summary Atrial fibrillation is a type of heartbeat that is irregular or fast. You are at higher risk of this condition if you smoke, are older, have diabetes, or are overweight. Follow your doctor's instructions about medicines, diet, exercise, and follow-up visits. Get help right away if you have signs or symptoms of a stroke. Get help right away if you cannot catch your breath, or you have chest pain or discomfort. This information is not intended to replace advice given to you by your health care provider. Make sure you discuss any questions you have with your health care provider. Document Revised: 06/06/2019 Document Reviewed: 06/06/2019 Elsevier Patient Education  2023 [employer] Inc.    Edwina Barth, MD [facility]

## 2022-06-23 ENCOUNTER — Other Ambulatory Visit: Payer: Self-pay | Admitting: Emergency Medicine

## 2022-06-23 DIAGNOSIS — N184 Chronic kidney disease, stage 4 (severe): Secondary | ICD-10-CM

## 2022-06-23 LAB — HEMOGLOBIN A1C
Hgb A1c MFr Bld: 5.6 % of total Hgb (ref <5.7)
Mean Plasma Glucose: 114 mg/dL
eAG (mmol/L): 6.3 mmol/L

## 2022-06-30 ENCOUNTER — Other Ambulatory Visit: Payer: Self-pay | Admitting: Emergency Medicine

## 2022-06-30 DIAGNOSIS — I10 Essential (primary) hypertension: Secondary | ICD-10-CM

## 2022-06-30 NOTE — Telephone Encounter (Signed)
Pt is requesting refill on: Nebivolol HCl 20 MG TABS    LOV-06/22/22       Pharmacy:  [id number] - [place], [address] [place] AT [place] Phone:  [phone]  Fax:  [phone]

## 2022-06-30 NOTE — Telephone Encounter (Signed)
Prescription sent to pharmacy.

## 2022-07-02 ENCOUNTER — Ambulatory Visit: Payer: BLUE CROSS/BLUE SHIELD | Admitting: Cardiovascular Disease

## 2022-07-29 DIAGNOSIS — I4891 Unspecified atrial fibrillation: Secondary | ICD-10-CM | POA: Diagnosis not present

## 2022-07-29 DIAGNOSIS — I129 Hypertensive chronic kidney disease with stage 1 through stage 4 chronic kidney disease, or unspecified chronic kidney disease: Secondary | ICD-10-CM | POA: Diagnosis not present

## 2022-07-29 DIAGNOSIS — N184 Chronic kidney disease, stage 4 (severe): Secondary | ICD-10-CM | POA: Diagnosis not present

## 2022-07-30 ENCOUNTER — Ambulatory Visit: Payer: BLUE CROSS/BLUE SHIELD | Admitting: Cardiovascular Disease

## 2022-08-09 ENCOUNTER — Encounter: Payer: Self-pay | Admitting: Cardiovascular Disease

## 2022-08-09 ENCOUNTER — Other Ambulatory Visit (HOSPITAL_COMMUNITY): Payer: Self-pay | Admitting: Nephrology

## 2022-08-09 ENCOUNTER — Other Ambulatory Visit: Payer: Self-pay | Admitting: Nephrology

## 2022-08-09 DIAGNOSIS — E210 Primary hyperparathyroidism: Secondary | ICD-10-CM

## 2022-09-13 ENCOUNTER — Telehealth: Payer: Self-pay

## 2022-09-13 DIAGNOSIS — M109 Gout, unspecified: Secondary | ICD-10-CM

## 2022-09-13 MED ORDER — COLCHICINE 0.6 MG PO TABS: ORAL_TABLET | ORAL | 1 refills | Status: DC

## 2022-09-13 NOTE — Telephone Encounter (Signed)
New medication sent to requested pharmacy  

## 2022-09-13 NOTE — Telephone Encounter (Signed)
Pt is requesting a refill on: colchicine 0.6 MG tablet  Pharmacy:  [facility] [place], [place] AT [place]  [place] 06/22/22 ROV 12/22/22

## 2022-09-14 ENCOUNTER — Other Ambulatory Visit: Payer: Self-pay | Admitting: Emergency Medicine

## 2022-09-14 DIAGNOSIS — M109 Gout, unspecified: Secondary | ICD-10-CM

## 2022-09-14 MED ORDER — PREDNISONE 20 MG PO TABS: ORAL_TABLET | ORAL | 1 refills | Status: DC

## 2022-09-14 NOTE — Telephone Encounter (Signed)
Patient states he is having a gout flare up and needs the prednisone for his feet.

## 2022-09-14 NOTE — Telephone Encounter (Signed)
Patient states that he was supposed to be called in a predisone refill yesterday too.  Please advise.

## 2022-09-14 NOTE — Telephone Encounter (Signed)
New prescription for prednisone sent to pharmacy of record.  Thanks.

## 2022-09-14 NOTE — Telephone Encounter (Signed)
Called patient and left message for patient to call back to office for additional information needed for the reason for this refill request

## 2022-09-14 NOTE — Telephone Encounter (Signed)
What is the need for prednisone?

## 2022-09-15 NOTE — Telephone Encounter (Signed)
Called patient and informed him his rx was sent to his requested pharmacy

## 2022-09-20 ENCOUNTER — Other Ambulatory Visit: Payer: Self-pay | Admitting: Emergency Medicine

## 2022-09-21 ENCOUNTER — Telehealth: Payer: Self-pay

## 2022-09-21 NOTE — Telephone Encounter (Signed)
Called patient in reference to previous message. Samples of Xarelto will be at the front desk as it is too early to send in a refill. Patient will come by to get them

## 2022-09-21 NOTE — Telephone Encounter (Signed)
MEDICATION: rivaroxaban (XARELTO) 20 MG TABS tablet  PHARMACY: [facility] [phone] - [place], [place] - [address] AT [address]  Comments: Patient is calling in saying he is aware his next refill isnt available until the 10th of October but lost his bottle. Pharmacy told him we denied the request.   **Let patient know to contact pharmacy at the end of the day to make sure medication is ready. **  ** Please notify patient to allow 48-72 hours to process**  **Encourage patient to contact the pharmacy for refills or they can request refills through [facility]**

## 2022-10-03 ENCOUNTER — Other Ambulatory Visit: Payer: Self-pay | Admitting: Internal Medicine

## 2022-10-03 DIAGNOSIS — M109 Gout, unspecified: Secondary | ICD-10-CM

## 2022-10-05 ENCOUNTER — Other Ambulatory Visit: Payer: Self-pay | Admitting: Emergency Medicine

## 2022-12-01 DIAGNOSIS — N184 Chronic kidney disease, stage 4 (severe): Secondary | ICD-10-CM | POA: Diagnosis not present

## 2022-12-01 DIAGNOSIS — M109 Gout, unspecified: Secondary | ICD-10-CM | POA: Diagnosis not present

## 2022-12-01 DIAGNOSIS — I4891 Unspecified atrial fibrillation: Secondary | ICD-10-CM | POA: Diagnosis not present

## 2022-12-01 DIAGNOSIS — I129 Hypertensive chronic kidney disease with stage 1 through stage 4 chronic kidney disease, or unspecified chronic kidney disease: Secondary | ICD-10-CM | POA: Diagnosis not present

## 2022-12-11 ENCOUNTER — Other Ambulatory Visit: Payer: Self-pay | Admitting: Emergency Medicine

## 2022-12-11 DIAGNOSIS — M109 Gout, unspecified: Secondary | ICD-10-CM

## 2022-12-18 ENCOUNTER — Ambulatory Visit (HOSPITAL_COMMUNITY)
Admission: EM | Admit: 2022-12-18 | Discharge: 2022-12-18 | Disposition: A | Discharge status: Home / Self Care | Payer: BLUE CROSS/BLUE SHIELD | Attending: Physician Assistant | Admitting: Physician Assistant

## 2022-12-18 ENCOUNTER — Encounter (HOSPITAL_COMMUNITY): Payer: Self-pay | Admitting: Emergency Medicine

## 2022-12-18 DIAGNOSIS — M109 Gout, unspecified: Secondary | ICD-10-CM | POA: Diagnosis not present

## 2022-12-18 DIAGNOSIS — M25562 Pain in left knee: Secondary | ICD-10-CM

## 2022-12-18 MED ORDER — PREDNISONE 10 MG PO TABS: 10.0000 mg | ORAL_TABLET | Freq: Three times a day (TID) | ORAL | 2 refills | Status: DC

## 2022-12-18 NOTE — Discharge Instructions (Signed)
Advised take extra strength Tylenol which is 650 mg, 2 tablets every 8 hours for the next couple days to help relieve the knee pain. Advised take prednisone 10 mg, 1 3 times a day for 5 days only to help reduce the gouty inflammatory process. Advised to follow-up PCP or return to urgent care if symptoms fail to improve.

## 2022-12-18 NOTE — ED Provider Notes (Signed)
[facility]    CSN: [id number] Arrival date & time: 12/18/22  1050      History   Chief Complaint Chief Complaint  Patient presents with   Knee Pain    HPI Isaiah [name] is a 61 y.o. male.   61 year old male presents with left knee pain.  Patient indicates he has a history of having gout occasionally and he relates that usually affects his big toe and his elbow.  Patient indicates for the past week he has been having left knee pain, tenderness, swelling.  Patient indicates that it hurts when he walks causing him to limp.  Patient indicates he has not traumatized or struck the left knee.  He does indicate that he took cholestyramine for 5 days but this did not help reduce the gouty pain.  Patient indicates he usually takes prednisone along with a Kolls name and this helps to reduce the gout but he did not have any prednisone at the present time.  Patient indicates he is not able to tolerate nonsteroidals.  Patient has not taken any medication for pain relief.  Denies any fever or chills   Knee Pain   Past Medical History:  Diagnosis Date   Atrial fibrillation, permanent ([place]) 01/24/2013   chronic,previuosly refuses coumadin,now on xarelto   CKD (chronic kidney disease) stage 3, GFR 30-59 ml/min ([facility]) 01/24/2013   Baseline creatinine roughly 1.7-1.8.   Erectile dysfunction    Extremity ischemia, critical bil lower ext. 01/24/2013   Status post bilateral common femoral, profunda femoral and superficial femoral arterial embolectomy   Gout    H/O right and left cardiac catheterization November 2009   30-40% RCA disease, cardiac output Fick 6.3, thermodilution 5.3.  Normal artery pressures.   Heart murmur    History of Non-ischemic cardiomyopathy 2009 - 2014   EF previously as low as 35-40% in 2009, up to 40 and 45% by 2012.  Repeat echo January 2014: Echo EF 55-60% mild LVH, mild to moderate left atrial enlargement, mild right atrial enlargement.   History of TIA  (transient ischemic attack) 2005   Carotid Dopplers January 2014 negative for significant stenosis.   Hypertension    Hypertensive heart disease    OSA on CPAP    use DME- Choice titration study was on 02/21/13   Pinched nerve in neck    Terminal aortic occlusion ([place]) 01/24/2013   Status post embolectomy    Patient Active Problem List   Diagnosis Date Noted   Current use of long term anticoagulation 07/28/2021   Stage 3b chronic kidney disease ([place]) 12/05/2020   Hypertensive heart disease    Dyslipidemia 02/02/2015   Obesity (BMI 30-39.9) 09/10/2013   Acute gouty arthritis 03/09/2013   Terminal aortic occlusion ([place]) 01/24/2013   Atrial fibrillation, permanent ([place]) 01/24/2013   Essential hypertension 01/24/2013   Erectile dysfunction 01/24/2013   Atherosclerotic cardiovascular disease 01/24/2013    Past Surgical History:  Procedure Laterality Date   CARDIAC CATHETERIZATION  Nov 2009   right and left  heart cath ,cardiac output 6.3 by FICK AND 5.34 by thermal  diluation.norm RV pressures and nonobstructive cor disease. no shunt.some intramyocardial bridgingof the LAD   CAROTID DOPPLERS  Nov 2009   done for TIA which were normal   DOPPLER ECHOCARDIOGRAPHY  02/2011; 12/2012   a) EF40-45%; LA mod to severe dilated ;; b) EF 55-60%, mild LVH, Mild-Mod LA dilation   EMBOLECTOMY  01/23/2013   Procedure: EMBOLECTOMY;  Surgeon: Serafina Mitchell, MD;  Location: [facility] OR;  Service: Vascular;  Laterality: Bilateral;  Bilateral femoral Embolectomy, Bilateral Iliac Embolectomy.   KNEE SURGERY         Home Medications    Prior to Admission medications   Medication Sig Start Date End Date Taking? Authorizing Provider  predniSONE (DELTASONE) 10 MG tablet Take 1 tablet (10 mg total) by mouth 3 (three) times daily. 12/18/22  Yes Nyoka Lint, PA-C  allopurinol (ZYLOPRIM) 300 MG tablet TAKE 1/2 TABLET(150 MG) BY MOUTH DAILY 10/04/22   Horald Pollen, MD  amLODipine (NORVASC) 10 MG tablet  Take 1 tablet (10 mg total) by mouth daily. 05/29/20   Posey Boyer, MD  colchicine 0.6 MG tablet TAKE 2 TABLETS BY MOUTH NOW THEN 1 TABLET 1 HOUR LATER, THEN 1 DAILY AFTER THAT FOR 5 DAYS 12/13/22   Horald Pollen, MD  Nebivolol HCl 20 MG TABS TAKE 1 TABLET(20 MG) BY MOUTH DAILY FOR BLOOD PRESSURE 06/30/22   Horald Pollen, MD  rosuvastatin (CRESTOR) 10 MG tablet Take 1 tablet (10 mg total) by mouth daily. 06/22/22   Horald Pollen, MD  XARELTO 20 MG TABS tablet TAKE 1 TABLET BY MOUTH DAILY WITH SUPPER 10/05/22   Horald Pollen, MD    Family History Family History  Problem Relation Age of Onset   Hypertension Mother    Stroke Mother    Hypertension Father    Stroke Father    Hypertension Sister    Hypertension Brother    Hypertension Sister     Social History Social History   Tobacco Use   Smoking status: Never   Smokeless tobacco: Never  Substance Use Topics   Alcohol use: No    Alcohol/week: 0.0 standard drinks of alcohol   Drug use: No     Allergies   Codeine and Ace inhibitors   Review of Systems Review of Systems  Musculoskeletal:  Positive for joint swelling (left knee).     Physical Exam Triage Vital Signs ED Triage Vitals  Enc Vitals Group     BP 12/18/22 1219 (!) 147/101     Pulse Rate 12/18/22 1219 69     Resp 12/18/22 1219 17     Temp 12/18/22 1219 98 F (36.7 C)     Temp Source 12/18/22 1219 Oral     SpO2 12/18/22 1219 98 %     Weight --      Height --      Head Circumference --      Peak Flow --      Pain Score 12/18/22 1218 10     Pain Loc --      Pain Edu? --      Excl. in [place]? --    No data found.  Updated Vital Signs BP (!) 147/101 (BP Location: Right Arm)   Pulse 69   Temp 98 F (36.7 C) (Oral)   Resp 17   SpO2 98%   Visual Acuity Right Eye Distance:   Left Eye Distance:   Bilateral Distance:    Right Eye Near:   Left Eye Near:    Bilateral Near:     Physical Exam Constitutional:       Appearance: Normal appearance.  Musculoskeletal:       Legs:     Comments: Left knee: 1+ swelling with mild warmth left knee, range of motion is limited with pain on flexion and extension.  Stability is intact.  Neurological:     Mental Status: He is  alert.      UC Treatments / Results  Labs (all labs ordered are listed, but only abnormal results are displayed) Labs Reviewed - No data to display  EKG   Radiology No results found.  Procedures Procedures (including critical care time)  Medications Ordered in UC Medications - No data to display  Initial Impression / Assessment and Plan / UC Course  I have reviewed the triage vital signs and the nursing notes.  Pertinent labs & imaging results that were available during my care of the patient were reviewed by me and considered in my medical decision making (see chart for details).    Plan: 1.  The acute pain of the left knee will be treated with the following: A.  Advised patient to take extra strength Tylenol 650 mg, 2 tablets every 8 hours to help reduce pain. 2.  The acute gout of the left knee will be treated with the following: A.  Prednisone 10 mg every 8 hours for 5 days only to treat the acute flare of the gout. 3.  Patient advised to watch diet as different food products tend to cause gouty flares. 4.  Advised follow-up PCP or return to urgent care if symptoms fail to improve. Final Clinical Impressions(s) / UC Diagnoses   Final diagnoses:  Acute pain of left knee  Acute gout of left knee, unspecified cause     Discharge Instructions      Advised take extra strength Tylenol which is 650 mg, 2 tablets every 8 hours for the next couple days to help relieve the knee pain. Advised take prednisone 10 mg, 1 3 times a day for 5 days only to help reduce the gouty inflammatory process. Advised to follow-up PCP or return to urgent care if symptoms fail to improve.    ED Prescriptions     Medication Sig Dispense  Auth. Provider   predniSONE (DELTASONE) 10 MG tablet Take 1 tablet (10 mg total) by mouth 3 (three) times daily. 15 tablet Nyoka Lint, PA-C      PDMP not reviewed this encounter.   Nyoka Lint, PA-C 12/18/22 1232

## 2022-12-18 NOTE — ED Triage Notes (Signed)
Reports has gout in left knee since Monday. Took medication for gout but didn't help. Didn't have prednisone that normally takes with it.

## 2022-12-22 ENCOUNTER — Encounter: Payer: Self-pay | Admitting: Emergency Medicine

## 2022-12-22 ENCOUNTER — Ambulatory Visit: Payer: BLUE CROSS/BLUE SHIELD | Admitting: Emergency Medicine

## 2022-12-22 VITALS — BP 130/80 | HR 42 | Temp 98.2°F | Ht 73.5 in | Wt 214.0 lb

## 2022-12-22 DIAGNOSIS — M109 Gout, unspecified: Secondary | ICD-10-CM

## 2022-12-22 DIAGNOSIS — E785 Hyperlipidemia, unspecified: Secondary | ICD-10-CM

## 2022-12-22 DIAGNOSIS — I10 Essential (primary) hypertension: Secondary | ICD-10-CM

## 2022-12-22 DIAGNOSIS — N1832 Chronic kidney disease, stage 3b: Secondary | ICD-10-CM

## 2022-12-22 DIAGNOSIS — I4821 Permanent atrial fibrillation: Secondary | ICD-10-CM | POA: Diagnosis not present

## 2022-12-22 DIAGNOSIS — I2510 Atherosclerotic heart disease of native coronary artery without angina pectoris: Secondary | ICD-10-CM

## 2022-12-22 DIAGNOSIS — Z7901 Long term (current) use of anticoagulants: Secondary | ICD-10-CM

## 2022-12-22 NOTE — Assessment & Plan Note (Signed)
Stable.  Diet and nutrition discussed.  Continue rosuvastatin 10 mg daily.  

## 2022-12-22 NOTE — Assessment & Plan Note (Signed)
No clinical bleeding episodes.  Tolerating medication well. Continue Xarelto 20 mg daily.

## 2022-12-22 NOTE — Patient Instructions (Signed)
Hypertension, Adult High blood pressure (hypertension) is when the force of blood pumping through the arteries is too strong. The arteries are the blood vessels that carry blood from the heart throughout the body. Hypertension forces the heart to work harder to pump blood and may cause arteries to become narrow or stiff. Untreated or uncontrolled hypertension can lead to a heart attack, heart failure, a stroke, kidney disease, and other problems. A blood pressure reading consists of a higher number over a lower number. Ideally, your blood pressure should be below 120/80. The first ("top") number is called the systolic pressure. It is a measure of the pressure in your arteries as your heart beats. The second ("bottom") number is called the diastolic pressure. It is a measure of the pressure in your arteries as the heart relaxes. What are the causes? The exact cause of this condition is not known. There are some conditions that result in high blood pressure. What increases the risk? Certain factors may make you more likely to develop high blood pressure. Some of these risk factors are under your control, including: Smoking. Not getting enough exercise or physical activity. Being overweight. Having too much fat, sugar, calories, or salt (sodium) in your diet. Drinking too much alcohol. Other risk factors include: Having a personal history of heart disease, diabetes, high cholesterol, or kidney disease. Stress. Having a family history of high blood pressure and high cholesterol. Having obstructive sleep apnea. Age. The risk increases with age. What are the signs or symptoms? High blood pressure may not cause symptoms. Very high blood pressure (hypertensive crisis) may cause: Headache. Fast or irregular heartbeats (palpitations). Shortness of breath. Nosebleed. Nausea and vomiting. Vision changes. Severe chest pain, dizziness, and seizures. How is this diagnosed? This condition is diagnosed by  measuring your blood pressure while you are seated, with your arm resting on a flat surface, your legs uncrossed, and your feet flat on the floor. The cuff of the blood pressure monitor will be placed directly against the skin of your upper arm at the level of your heart. Blood pressure should be measured at least twice using the same arm. Certain conditions can cause a difference in blood pressure between your right and left arms. If you have a high blood pressure reading during one visit or you have normal blood pressure with other risk factors, you may be asked to: Return on a different day to have your blood pressure checked again. Monitor your blood pressure at home for 1 week or longer. If you are diagnosed with hypertension, you may have other blood or imaging tests to help your health care provider understand your overall risk for other conditions. How is this treated? This condition is treated by making healthy lifestyle changes, such as eating healthy foods, exercising more, and reducing your alcohol intake. You may be referred for counseling on a healthy diet and physical activity. Your health care provider may prescribe medicine if lifestyle changes are not enough to get your blood pressure under control and if: Your systolic blood pressure is above 130. Your diastolic blood pressure is above 80. Your personal target blood pressure may vary depending on your medical conditions, your age, and other factors. Follow these instructions at home: Eating and drinking  Eat a diet that is high in fiber and potassium, and low in sodium, added sugar, and fat. An example of this eating plan is called the DASH diet. DASH stands for Dietary Approaches to Stop Hypertension. To eat this way: Eat   plenty of fresh fruits and vegetables. Try to fill one half of your plate at each meal with fruits and vegetables. Eat whole grains, such as whole-wheat pasta, brown rice, or whole-grain bread. Fill about one  fourth of your plate with whole grains. Eat or drink low-fat dairy products, such as skim milk or low-fat yogurt. Avoid fatty cuts of meat, processed or cured meats, and poultry with skin. Fill about one fourth of your plate with lean proteins, such as fish, chicken without skin, beans, eggs, or tofu. Avoid pre-made and processed foods. These tend to be higher in sodium, added sugar, and fat. Reduce your daily sodium intake. Many people with hypertension should eat less than 1,500 mg of sodium a day. Do not drink alcohol if: Your health care provider tells you not to drink. You are pregnant, may be pregnant, or are planning to become pregnant. If you drink alcohol: Limit how much you have to: 0-1 drink a day for women. 0-2 drinks a day for men. Know how much alcohol is in your drink. In the [place] one drink equals one 12 oz bottle of beer (355 mL), one 5 oz glass of wine (148 mL), or one 1 oz glass of hard liquor (44 mL). Lifestyle  Work with your health care provider to maintain a healthy body weight or to lose weight. Ask what an ideal weight is for you. Get at least 30 minutes of exercise that causes your heart to beat faster (aerobic exercise) most days of the week. Activities may include walking, swimming, or biking. Include exercise to strengthen your muscles (resistance exercise), such as Pilates or lifting weights, as part of your weekly exercise routine. Try to do these types of exercises for 30 minutes at least 3 days a week. Do not use any products that contain nicotine or tobacco. These products include cigarettes, chewing tobacco, and vaping devices, such as e-cigarettes. If you need help quitting, ask your health care provider. Monitor your blood pressure at home as told by your health care provider. Keep all follow-up visits. This is important. Medicines Take over-the-counter and prescription medicines only as told by your health care provider. Follow directions carefully. Blood  pressure medicines must be taken as prescribed. Do not skip doses of blood pressure medicine. Doing this puts you at risk for problems and can make the medicine less effective. Ask your health care provider about side effects or reactions to medicines that you should watch for. Contact a health care provider if you: Think you are having a reaction to a medicine you are taking. Have headaches that keep coming back (recurring). Feel dizzy. Have swelling in your ankles. Have trouble with your vision. Get help right away if you: Develop a severe headache or confusion. Have unusual weakness or numbness. Feel faint. Have severe pain in your chest or abdomen. Vomit repeatedly. Have trouble breathing. These symptoms may be an emergency. Get help right away. Call 911. Do not wait to see if the symptoms will go away. Do not drive yourself to the hospital. Summary Hypertension is when the force of blood pumping through your arteries is too strong. If this condition is not controlled, it may put you at risk for serious complications. Your personal target blood pressure may vary depending on your medical conditions, your age, and other factors. For most people, a normal blood pressure is less than 120/80. Hypertension is treated with lifestyle changes, medicines, or a combination of both. Lifestyle changes include losing weight, eating a healthy,   low-sodium diet, exercising more, and limiting alcohol. This information is not intended to replace advice given to you by your health care provider. Make sure you discuss any questions you have with your health care provider. Document Revised: 10/20/2021 Document Reviewed: 10/20/2021 [employer] Patient Education  2023 [employer]  

## 2022-12-22 NOTE — Progress Notes (Signed)
Isaiah [name] 61 y.o.   Chief Complaint  Patient presents with   Follow-up    29mnth f/u appt, no concerns     HISTORY OF PRESENT ILLNESS: This is a 61 y.o. male here for 50-month follow-up of chronic medical conditions. Overall doing well. Has no complaints or medical concerns today.  HPI   Prior to Admission medications   Medication Sig Start Date End Date Taking? Authorizing Provider  allopurinol (ZYLOPRIM) 300 MG tablet TAKE 1/2 TABLET(150 MG) BY MOUTH DAILY 10/04/22  Yes Rosabelle Jupin, Ines Bloomer, MD  colchicine 0.6 MG tablet TAKE 2 TABLETS BY MOUTH NOW THEN 1 TABLET 1 HOUR LATER, THEN 1 DAILY AFTER THAT FOR 5 DAYS 12/13/22  Yes Annita Ratliff, Ines Bloomer, MD  Nebivolol HCl 20 MG TABS TAKE 1 TABLET(20 MG) BY MOUTH DAILY FOR BLOOD PRESSURE 06/30/22  Yes Wynn Kernes, Ines Bloomer, MD  predniSONE (DELTASONE) 10 MG tablet Take 1 tablet (10 mg total) by mouth 3 (three) times daily. 12/18/22  Yes Nyoka Lint, PA-C  rosuvastatin (CRESTOR) 10 MG tablet Take 1 tablet (10 mg total) by mouth daily. 06/22/22  Yes Shandora Koogler, Ines Bloomer, MD  XARELTO 20 MG TABS tablet TAKE 1 TABLET BY MOUTH DAILY WITH SUPPER 10/05/22  Yes Noelie Renfrow, Ines Bloomer, MD  amLODipine (NORVASC) 10 MG tablet Take 1 tablet (10 mg total) by mouth daily. Patient not taking: Reported on 12/22/2022 05/29/20   Posey Boyer, MD    Allergies  Allergen Reactions   Codeine Itching   Ace Inhibitors Swelling    Swelling of legs    Patient Active Problem List   Diagnosis Date Noted   Current use of long term anticoagulation 07/28/2021   Stage 3b chronic kidney disease ([place]) 12/05/2020   Hypertensive heart disease    Dyslipidemia 02/02/2015   Obesity (BMI 30-39.9) 09/10/2013   Acute gouty arthritis 03/09/2013   Terminal aortic occlusion ([place]) 01/24/2013   Atrial fibrillation, permanent ([place]) 01/24/2013   Essential hypertension 01/24/2013   Erectile dysfunction 01/24/2013   Atherosclerotic cardiovascular disease 01/24/2013    Past  Medical History:  Diagnosis Date   Atrial fibrillation, permanent ([place]) 01/24/2013   chronic,previuosly refuses coumadin,now on xarelto   CKD (chronic kidney disease) stage 3, GFR 30-59 ml/min ([place]) 01/24/2013   Baseline creatinine roughly 1.7-1.8.   Erectile dysfunction    Extremity ischemia, critical bil lower ext. 01/24/2013   Status post bilateral common femoral, profunda femoral and superficial femoral arterial embolectomy   Gout    H/O right and left cardiac catheterization November 2009   30-40% RCA disease, cardiac output Fick 6.3, thermodilution 5.3.  Normal artery pressures.   Heart murmur    History of Non-ischemic cardiomyopathy 2009 - 2014   EF previously as low as 35-40% in 2009, up to 40 and 45% by 2012.  Repeat echo January 2014: Echo EF 55-60% mild LVH, mild to moderate left atrial enlargement, mild right atrial enlargement.   History of TIA (transient ischemic attack) 2005   Carotid Dopplers January 2014 negative for significant stenosis.   Hypertension    Hypertensive heart disease    OSA on CPAP    use DME- Choice titration study was on 02/21/13   Pinched nerve in neck    Terminal aortic occlusion ([place]) 01/24/2013   Status post embolectomy    Past Surgical History:  Procedure Laterality Date   CARDIAC CATHETERIZATION  Nov 2009   right and left  heart cath ,cardiac output 6.3 by FICK AND 5.34 by thermal  diluation.norm  RV pressures and nonobstructive cor disease. no shunt.some intramyocardial bridgingof the LAD   CAROTID DOPPLERS  Nov 2009   done for TIA which were normal   DOPPLER ECHOCARDIOGRAPHY  02/2011; 12/2012   a) EF40-45%; LA mod to severe dilated ;; b) EF 55-60%, mild LVH, Mild-Mod LA dilation   EMBOLECTOMY  01/23/2013   Procedure: EMBOLECTOMY;  Surgeon: Serafina Mitchell, MD;  Location: [facility] OR;  Service: Vascular;  Laterality: Bilateral;  Bilateral femoral Embolectomy, Bilateral Iliac Embolectomy.   KNEE SURGERY      Social History   Socioeconomic History    Marital status: Divorced    Spouse name: Not on file   Number of children: Not on file   Years of education: Not on file   Highest education level: Not on file  Occupational History   Not on file  Tobacco Use   Smoking status: Never   Smokeless tobacco: Never  Substance and Sexual Activity   Alcohol use: No    Alcohol/week: 0.0 standard drinks of alcohol   Drug use: No   Sexual activity: Yes    Comment: married  Other Topics Concern   Not on file  Social History Narrative   Not on file   Social Determinants of Health   Financial Resource Strain: Not on file  Food Insecurity: Not on file  Transportation Needs: Not on file  Physical Activity: Not on file  Stress: Not on file  Social Connections: Not on file  Intimate Partner Violence: Not on file    Family History  Problem Relation Age of Onset   Hypertension Mother    Stroke Mother    Hypertension Father    Stroke Father    Hypertension Sister    Hypertension Brother    Hypertension Sister      Review of Systems  Constitutional: Negative.  Negative for chills and fever.  HENT: Negative.  Negative for congestion, nosebleeds and sore throat.   Respiratory: Negative.  Negative for cough, hemoptysis and shortness of breath.   Cardiovascular: Negative.  Negative for chest pain and palpitations.  Gastrointestinal:  Negative for abdominal pain, blood in stool, melena, nausea and vomiting.  Genitourinary:  Negative for hematuria.  Skin: Negative.  Negative for rash.  Neurological:  Negative for dizziness and headaches.  All other systems reviewed and are negative.  Today's Vitals   12/22/22 0953 12/22/22 1210  BP: (!) 140/88 130/80  Pulse: (!) 42   Temp: 98.2 F (36.8 C)   TempSrc: Oral   SpO2: 91%   Weight: 214 lb (97.1 kg)   Height: 6' 1.5" (1.867 m)    Body mass index is 27.85 kg/m.   Physical Exam Vitals reviewed.  Constitutional:      Appearance: Normal appearance.  HENT:     Head:  Normocephalic.     Mouth/Throat:     Mouth: Mucous membranes are moist.     Pharynx: Oropharynx is clear.  Eyes:     Extraocular Movements: Extraocular movements intact.     Pupils: Pupils are equal, round, and reactive to light.  Cardiovascular:     Rate and Rhythm: Normal rate and regular rhythm.     Pulses: Normal pulses.     Heart sounds: Normal heart sounds.  Pulmonary:     Effort: Pulmonary effort is normal.     Breath sounds: Normal breath sounds.  Musculoskeletal:     Cervical back: No tenderness.  Lymphadenopathy:     Cervical: No cervical adenopathy.  Skin:  General: Skin is warm and dry.  Neurological:     General: No focal deficit present.     Mental Status: He is alert and oriented to person, place, and time.  Psychiatric:        Mood and Affect: Mood normal.        Behavior: Behavior normal.      ASSESSMENT & PLAN: A total of 47 minutes was spent with the patient and counseling/coordination of care regarding preparing for this visit, review of most recent office visit notes, review of multiple chronic medical conditions and their management, review of all medications, cardiovascular risks associated with hypertension, education on nutrition, prognosis, documentation, need for follow-up.  Problem List Items Addressed This Visit       Cardiovascular and Mediastinum   Atrial fibrillation, permanent (HCC) (Chronic)    Chronic A-fib.  Asymptomatic. On long-term anticoagulation with Xarelto 20 mg daily. Rate controlled with Bystolic 20 mg daily No complications      Essential hypertension - Primary (Chronic)    Well-controlled hypertension with normal blood pressure readings at home. Continue amlodipine 10 mg daily along with Bystolic 20 mg daily Tolerating bradycardia well.      Atherosclerotic cardiovascular disease    Stable.  Diet and nutrition discussed. Continue rosuvastatin 10 mg daily.        Musculoskeletal and Integument   Acute gouty  arthritis    Recent gout flareup left knee.  Went to urgent care.  Doing well.  Continues allopurinol 300 mg daily.        Genitourinary   Stage 3b chronic kidney disease [facility])    Sees nephrologist on a regular basis. Not taking ACE/ARB inhibitors        Other   Dyslipidemia    Stable.  Diet and nutrition discussed. Continue rosuvastatin 10 mg daily. The 10-year ASCVD risk score (Arnett DK, et al., 2019) is: 13.1%   Values used to calculate the score:     Age: 85 years     Sex: Male     Is Non-Hispanic African American: Yes     Diabetic: No     Tobacco smoker: No     Systolic Blood Pressure: 128 mmHg     Is BP treated: Yes     HDL Cholesterol: 54.1 mg/dL     Total Cholesterol: 181 mg/dL       Current use of long term anticoagulation    No clinical bleeding episodes.  Tolerating medication well. Continue Xarelto 20 mg daily.      Patient Instructions  Hypertension, Adult High blood pressure (hypertension) is when the force of blood pumping through the arteries is too strong. The arteries are the blood vessels that carry blood from the heart throughout the body. Hypertension forces the heart to work harder to pump blood and may cause arteries to become narrow or stiff. Untreated or uncontrolled hypertension can lead to a heart attack, heart failure, a stroke, kidney disease, and other problems. A blood pressure reading consists of a higher number over a lower number. Ideally, your blood pressure should be below 120/80. The first ("top") number is called the systolic pressure. It is a measure of the pressure in your arteries as your heart beats. The second ("bottom") number is called the diastolic pressure. It is a measure of the pressure in your arteries as the heart relaxes. What are the causes? The exact cause of this condition is not known. There are some conditions that result in high blood pressure.  What increases the risk? Certain factors may make you more likely to  develop high blood pressure. Some of these risk factors are under your control, including: Smoking. Not getting enough exercise or physical activity. Being overweight. Having too much fat, sugar, calories, or salt (sodium) in your diet. Drinking too much alcohol. Other risk factors include: Having a personal history of heart disease, diabetes, high cholesterol, or kidney disease. Stress. Having a family history of high blood pressure and high cholesterol. Having obstructive sleep apnea. Age. The risk increases with age. What are the signs or symptoms? High blood pressure may not cause symptoms. Very high blood pressure (hypertensive crisis) may cause: Headache. Fast or irregular heartbeats (palpitations). Shortness of breath. Nosebleed. Nausea and vomiting. Vision changes. Severe chest pain, dizziness, and seizures. How is this diagnosed? This condition is diagnosed by measuring your blood pressure while you are seated, with your arm resting on a flat surface, your legs uncrossed, and your feet flat on the floor. The cuff of the blood pressure monitor will be placed directly against the skin of your upper arm at the level of your heart. Blood pressure should be measured at least twice using the same arm. Certain conditions can cause a difference in blood pressure between your right and left arms. If you have a high blood pressure reading during one visit or you have normal blood pressure with other risk factors, you may be asked to: Return on a different day to have your blood pressure checked again. Monitor your blood pressure at home for 1 week or longer. If you are diagnosed with hypertension, you may have other blood or imaging tests to help your health care provider understand your overall risk for other conditions. How is this treated? This condition is treated by making healthy lifestyle changes, such as eating healthy foods, exercising more, and reducing your alcohol intake. You  may be referred for counseling on a healthy diet and physical activity. Your health care provider may prescribe medicine if lifestyle changes are not enough to get your blood pressure under control and if: Your systolic blood pressure is above 130. Your diastolic blood pressure is above 80. Your personal target blood pressure may vary depending on your medical conditions, your age, and other factors. Follow these instructions at home: Eating and drinking  Eat a diet that is high in fiber and potassium, and low in sodium, added sugar, and fat. An example of this eating plan is called the DASH diet. DASH stands for Dietary Approaches to Stop Hypertension. To eat this way: Eat plenty of fresh fruits and vegetables. Try to fill one half of your plate at each meal with fruits and vegetables. Eat whole grains, such as whole-wheat pasta, brown rice, or whole-grain bread. Fill about one fourth of your plate with whole grains. Eat or drink low-fat dairy products, such as skim milk or low-fat yogurt. Avoid fatty cuts of meat, processed or cured meats, and poultry with skin. Fill about one fourth of your plate with lean proteins, such as fish, chicken without skin, beans, eggs, or tofu. Avoid pre-made and processed foods. These tend to be higher in sodium, added sugar, and fat. Reduce your daily sodium intake. Many people with hypertension should eat less than 1,500 mg of sodium a day. Do not drink alcohol if: Your health care provider tells you not to drink. You are pregnant, may be pregnant, or are planning to become pregnant. If you drink alcohol: Limit how much you have to: 0-1  drink a day for women. 0-2 drinks a day for men. Know how much alcohol is in your drink. In the [place] one drink equals one 12 oz bottle of beer (355 mL), one 5 oz glass of wine (148 mL), or one 1 oz glass of hard liquor (44 mL). Lifestyle  Work with your health care provider to maintain a healthy body weight or to lose  weight. Ask what an ideal weight is for you. Get at least 30 minutes of exercise that causes your heart to beat faster (aerobic exercise) most days of the week. Activities may include walking, swimming, or biking. Include exercise to strengthen your muscles (resistance exercise), such as Pilates or lifting weights, as part of your weekly exercise routine. Try to do these types of exercises for 30 minutes at least 3 days a week. Do not use any products that contain nicotine or tobacco. These products include cigarettes, chewing tobacco, and vaping devices, such as e-cigarettes. If you need help quitting, ask your health care provider. Monitor your blood pressure at home as told by your health care provider. Keep all follow-up visits. This is important. Medicines Take over-the-counter and prescription medicines only as told by your health care provider. Follow directions carefully. Blood pressure medicines must be taken as prescribed. Do not skip doses of blood pressure medicine. Doing this puts you at risk for problems and can make the medicine less effective. Ask your health care provider about side effects or reactions to medicines that you should watch for. Contact a health care provider if you: Think you are having a reaction to a medicine you are taking. Have headaches that keep coming back (recurring). Feel dizzy. Have swelling in your ankles. Have trouble with your vision. Get help right away if you: Develop a severe headache or confusion. Have unusual weakness or numbness. Feel faint. Have severe pain in your chest or abdomen. Vomit repeatedly. Have trouble breathing. These symptoms may be an emergency. Get help right away. Call 911. Do not wait to see if the symptoms will go away. Do not drive yourself to the hospital. Summary Hypertension is when the force of blood pumping through your arteries is too strong. If this condition is not controlled, it may put you at risk for serious  complications. Your personal target blood pressure may vary depending on your medical conditions, your age, and other factors. For most people, a normal blood pressure is less than 120/80. Hypertension is treated with lifestyle changes, medicines, or a combination of both. Lifestyle changes include losing weight, eating a healthy, low-sodium diet, exercising more, and limiting alcohol. This information is not intended to replace advice given to you by your health care provider. Make sure you discuss any questions you have with your health care provider. Document Revised: 10/20/2021 Document Reviewed: 10/20/2021 Elsevier Patient Education  [place], MD [facility]

## 2022-12-22 NOTE — Assessment & Plan Note (Signed)
Chronic A-fib.  Asymptomatic. On long-term anticoagulation with Xarelto 20 mg daily. Rate controlled with Bystolic 20 mg daily No complications

## 2022-12-22 NOTE — Assessment & Plan Note (Signed)
>>  ASSESSMENT AND PLAN FOR STAGE 3B CHRONIC KIDNEY DISEASE (HCC) WRITTEN ON 12/22/2022 12:13 PM BY SAGARDIA, EMIL SCHANZ, MD  Sees nephrologist on a regular basis. Not taking ACE/ARB inhibitors

## 2022-12-22 NOTE — Assessment & Plan Note (Signed)
Sees nephrologist on a regular basis. Not taking ACE/ARB inhibitors

## 2022-12-22 NOTE — Assessment & Plan Note (Signed)
Well-controlled hypertension with normal blood pressure readings at home. Continue amlodipine 10 mg daily along with Bystolic 20 mg daily Tolerating bradycardia well.

## 2022-12-22 NOTE — Assessment & Plan Note (Signed)
Recent gout flareup left knee.  Went to urgent care.  Doing well.  Continues allopurinol 300 mg daily.

## 2022-12-22 NOTE — Assessment & Plan Note (Signed)
Stable.  Diet and nutrition discussed. Continue rosuvastatin 10 mg daily. The 10-year ASCVD risk score (Arnett DK, et al., 2019) is: 13.1%   Values used to calculate the score:     Age: 61 years     Sex: Male     Is Non-Hispanic African American: Yes     Diabetic: No     Tobacco smoker: No     Systolic Blood Pressure: 340 mmHg     Is BP treated: Yes     HDL Cholesterol: 54.1 mg/dL     Total Cholesterol: 181 mg/dL

## 2022-12-26 ENCOUNTER — Other Ambulatory Visit: Payer: Self-pay | Admitting: Emergency Medicine

## 2022-12-26 DIAGNOSIS — I10 Essential (primary) hypertension: Secondary | ICD-10-CM

## 2023-03-14 ENCOUNTER — Other Ambulatory Visit: Payer: Self-pay | Admitting: Emergency Medicine

## 2023-03-14 DIAGNOSIS — M109 Gout, unspecified: Secondary | ICD-10-CM

## 2023-04-07 ENCOUNTER — Other Ambulatory Visit: Payer: Self-pay | Admitting: Emergency Medicine

## 2023-04-07 DIAGNOSIS — M109 Gout, unspecified: Secondary | ICD-10-CM

## 2023-04-12 ENCOUNTER — Telehealth: Payer: Self-pay | Admitting: Emergency Medicine

## 2023-04-12 NOTE — Telephone Encounter (Signed)
Patient called and said he will be without insurance until November. His Xarelto needs to be refilled soon, but he cannot afford the out-of-pocket cost. He would like to know if there is any financial assistance or something else Dr. Alvy Bimler would recommend. He would like a callback at [phone].

## 2023-04-13 NOTE — Telephone Encounter (Signed)
Called patient and he will come to the job tomorrow to pick up a patient assistance form as well as samples. These are going to be at the front desk

## 2023-05-31 ENCOUNTER — Other Ambulatory Visit: Payer: Self-pay | Admitting: Emergency Medicine

## 2023-05-31 ENCOUNTER — Other Ambulatory Visit: Payer: Self-pay | Admitting: *Deleted

## 2023-05-31 ENCOUNTER — Ambulatory Visit (INDEPENDENT_AMBULATORY_CARE_PROVIDER_SITE_OTHER): Payer: 59 | Admitting: Emergency Medicine

## 2023-05-31 ENCOUNTER — Other Ambulatory Visit: Payer: 59

## 2023-05-31 ENCOUNTER — Encounter: Payer: Self-pay | Admitting: Emergency Medicine

## 2023-05-31 ENCOUNTER — Telehealth: Payer: Self-pay | Admitting: Emergency Medicine

## 2023-05-31 VITALS — BP 128/76 | HR 60 | Temp 98.1°F | Ht 73.5 in | Wt 212.0 lb

## 2023-05-31 DIAGNOSIS — I428 Other cardiomyopathies: Secondary | ICD-10-CM | POA: Diagnosis not present

## 2023-05-31 DIAGNOSIS — G5793 Unspecified mononeuropathy of bilateral lower limbs: Secondary | ICD-10-CM

## 2023-05-31 DIAGNOSIS — N1832 Chronic kidney disease, stage 3b: Secondary | ICD-10-CM | POA: Diagnosis not present

## 2023-05-31 DIAGNOSIS — I2510 Atherosclerotic heart disease of native coronary artery without angina pectoris: Secondary | ICD-10-CM

## 2023-05-31 DIAGNOSIS — E210 Primary hyperparathyroidism: Secondary | ICD-10-CM

## 2023-05-31 DIAGNOSIS — Z7901 Long term (current) use of anticoagulants: Secondary | ICD-10-CM

## 2023-05-31 DIAGNOSIS — E785 Hyperlipidemia, unspecified: Secondary | ICD-10-CM | POA: Diagnosis not present

## 2023-05-31 DIAGNOSIS — I10 Essential (primary) hypertension: Secondary | ICD-10-CM

## 2023-05-31 DIAGNOSIS — N183 Chronic kidney disease, stage 3 unspecified: Secondary | ICD-10-CM

## 2023-05-31 DIAGNOSIS — Z8739 Personal history of other diseases of the musculoskeletal system and connective tissue: Secondary | ICD-10-CM

## 2023-05-31 DIAGNOSIS — I4821 Permanent atrial fibrillation: Secondary | ICD-10-CM

## 2023-05-31 DIAGNOSIS — M109 Gout, unspecified: Secondary | ICD-10-CM

## 2023-05-31 DIAGNOSIS — Z1159 Encounter for screening for other viral diseases: Secondary | ICD-10-CM

## 2023-05-31 LAB — COMPREHENSIVE METABOLIC PANEL
ALT: 21 U/L (ref 0–53)
AST: 19 U/L (ref 0–37)
Albumin: 3.9 g/dL (ref 3.5–5.2)
Alkaline Phosphatase: 157 U/L — ABNORMAL HIGH (ref 39–117)
BUN: 32 mg/dL — ABNORMAL HIGH (ref 6–23)
CO2: 26 mEq/L (ref 19–32)
Calcium: 9.7 mg/dL (ref 8.4–10.5)
Chloride: 106 mEq/L (ref 96–112)
Creatinine, Ser: 3.58 mg/dL — ABNORMAL HIGH (ref 0.40–1.50)
GFR: 17.65 mL/min — ABNORMAL LOW (ref >60.00)
Glucose, Bld: 82 mg/dL (ref 70–99)
Potassium: 4.1 mEq/L (ref 3.5–5.1)
Sodium: 138 mEq/L (ref 135–145)
Total Bilirubin: 0.7 mg/dL (ref 0.2–1.2)
Total Protein: 7.1 g/dL (ref 6.0–8.3)

## 2023-05-31 LAB — CBC WITH DIFFERENTIAL/PLATELET
Basophils Absolute: 0 10*3/uL (ref 0.0–0.1)
Basophils Relative: 0.8 % (ref 0.0–3.0)
Eosinophils Absolute: 0.2 10*3/uL (ref 0.0–0.7)
Eosinophils Relative: 3.5 % (ref 0.0–5.0)
HCT: 41.4 % (ref 39.0–52.0)
Hemoglobin: 13.2 g/dL (ref 13.0–17.0)
Lymphocytes Relative: 26 % (ref 12.0–46.0)
Lymphs Abs: 1.5 10*3/uL (ref 0.7–4.0)
MCHC: 31.9 g/dL (ref 30.0–36.0)
MCV: 101.7 fl — ABNORMAL HIGH (ref 78.0–100.0)
Monocytes Absolute: 0.7 10*3/uL (ref 0.1–1.0)
Monocytes Relative: 11.8 % (ref 3.0–12.0)
Neutro Abs: 3.3 10*3/uL (ref 1.4–7.7)
Neutrophils Relative %: 57.9 % (ref 43.0–77.0)
Platelets: 148 10*3/uL — ABNORMAL LOW (ref 150.0–400.0)
RBC: 4.07 Mil/uL — ABNORMAL LOW (ref 4.22–5.81)
RDW: 13.2 % (ref 11.5–15.5)
WBC: 5.7 10*3/uL (ref 4.0–10.5)

## 2023-05-31 LAB — LIPID PANEL
Cholesterol: 165 mg/dL (ref 0–200)
HDL: 43.7 mg/dL (ref >39.00)
LDL Cholesterol: 104 mg/dL — ABNORMAL HIGH (ref 0–99)
NonHDL: 121.18
Total CHOL/HDL Ratio: 4
Triglycerides: 85 mg/dL (ref 0.0–149.0)
VLDL: 17 mg/dL (ref 0.0–40.0)

## 2023-05-31 LAB — URIC ACID: Uric Acid, Serum: 10 mg/dL — ABNORMAL HIGH (ref 4.0–7.8)

## 2023-05-31 MED ORDER — RIVAROXABAN 20 MG PO TABS: 20.0000 mg | ORAL_TABLET | Freq: Every day | ORAL | 3 refills | Status: DC

## 2023-05-31 MED ORDER — ROSUVASTATIN CALCIUM 10 MG PO TABS
10.0000 mg | ORAL_TABLET | Freq: Every day | ORAL | 3 refills | Status: DC
Start: 2023-05-31 — End: 2023-05-31

## 2023-05-31 MED ORDER — NEBIVOLOL HCL 20 MG PO TABS
ORAL_TABLET | ORAL | 3 refills | Status: DC
Start: 2023-05-31 — End: 2023-06-20

## 2023-05-31 MED ORDER — ALLOPURINOL 300 MG PO TABS
ORAL_TABLET | ORAL | 1 refills | Status: DC
Start: 2023-05-31 — End: 2024-09-03

## 2023-05-31 MED ORDER — COLCHICINE 0.6 MG PO TABS
ORAL_TABLET | ORAL | 1 refills | Status: DC
Start: 2023-05-31 — End: 2023-08-04

## 2023-05-31 MED ORDER — GABAPENTIN 300 MG PO CAPS
300.0000 mg | ORAL_CAPSULE | Freq: Every day | ORAL | 3 refills | Status: AC
Start: 2023-05-31 — End: ?

## 2023-05-31 MED ORDER — NEBIVOLOL HCL 20 MG PO TABS
ORAL_TABLET | ORAL | 1 refills | Status: DC
Start: 2023-05-31 — End: 2023-05-31

## 2023-05-31 MED ORDER — ROSUVASTATIN CALCIUM 10 MG PO TABS
10.0000 mg | ORAL_TABLET | Freq: Every day | ORAL | 3 refills | Status: AC
Start: 2023-05-31 — End: ?

## 2023-05-31 NOTE — Assessment & Plan Note (Signed)
Diet and nutrition discussed. Continue rosuvastatin 10 mg daily.  

## 2023-05-31 NOTE — Assessment & Plan Note (Signed)
Chronic atrial fibrillation.  Asymptomatic Well-controlled rate with nebivolol 20 mg daily Continues daily Xarelto 20 mg Fall precautions given

## 2023-05-31 NOTE — Progress Notes (Signed)
Isaiah [name] 62 y.o.   Chief Complaint  Patient presents with   Gout    Poss gout flare up, patient unsure, the bottom of his feet are painful x 2 weeks     HISTORY OF PRESENT ILLNESS: This is a 62 y.o. male complaining of burning pain to bottom of feet and numbness to his toes for the past several weeks Past medical history includes peripheral artery disease with surgical procedure done about 10 years ago Also has history of chronic atrial fibrillation on long-term anticoagulation with Xarelto History of hypertension and dyslipidemia  HPI   Prior to Admission medications   Medication Sig Start Date End Date Taking? Authorizing Provider  allopurinol (ZYLOPRIM) 300 MG tablet TAKE 1/2 TABLET(150 MG) BY MOUTH DAILY 05/31/23  Yes Cedrik Heindl, Eilleen Kempf, MD  colchicine 0.6 MG tablet TAKE 2 TABLETS BY MOUTH NOW THEN TAKE 1 TABLET 1 HOUR LATER THEN 1 DAILY AFTER THAT FOR 5 DAYS 05/31/23  Yes Syncere Eble, Eilleen Kempf, MD  Nebivolol HCl 20 MG TABS Take 1 tablet by mouth daily for blood pressure 05/31/23  Yes Tyreque Finken, Eilleen Kempf, MD  predniSONE (DELTASONE) 10 MG tablet Take 1 tablet (10 mg total) by mouth 3 (three) times daily. 12/18/22  Yes Ellsworth Lennox, PA-C  rivaroxaban (XARELTO) 20 MG TABS tablet Take 1 tablet (20 mg total) by mouth daily with supper. 05/31/23  Yes Mahogani Holohan, Eilleen Kempf, MD  rosuvastatin (CRESTOR) 10 MG tablet Take 1 tablet (10 mg total) by mouth daily. 05/31/23  Yes Aurore Redinger, Eilleen Kempf, MD  amLODipine (NORVASC) 10 MG tablet Take 1 tablet (10 mg total) by mouth daily. Patient not taking: Reported on 12/22/2022 05/29/20   Peyton Najjar, MD    Allergies  Allergen Reactions   Codeine Itching   Ace Inhibitors Swelling    Swelling of legs    Patient Active Problem List   Diagnosis Date Noted   Current use of long term anticoagulation 07/28/2021   Stage 3b chronic kidney disease (HCC) 12/05/2020   Hypertensive heart disease    Dyslipidemia 02/02/2015   Obesity (BMI 30-39.9)  09/10/2013   Acute gouty arthritis 03/09/2013   Terminal aortic occlusion (HCC) 01/24/2013   Atrial fibrillation, permanent (HCC) 01/24/2013   Essential hypertension 01/24/2013   Erectile dysfunction 01/24/2013   Atherosclerotic cardiovascular disease 01/24/2013    Past Medical History:  Diagnosis Date   Atrial fibrillation, permanent (HCC) 01/24/2013   chronic,previuosly refuses coumadin,now on xarelto   CKD (chronic kidney disease) stage 3, GFR 30-59 ml/min (HCC) 01/24/2013   Baseline creatinine roughly 1.7-1.8.   Erectile dysfunction    Extremity ischemia, critical bil lower ext. 01/24/2013   Status post bilateral common femoral, profunda femoral and superficial femoral arterial embolectomy   Gout    H/O right and left cardiac catheterization November 2009   30-40% RCA disease, cardiac output Fick 6.3, thermodilution 5.3.  Normal artery pressures.   Heart murmur    History of Non-ischemic cardiomyopathy 2009 - 2014   EF previously as low as 35-40% in 2009, up to 40 and 45% by 2012.  Repeat echo January 2014: Echo EF 55-60% mild LVH, mild to moderate left atrial enlargement, mild right atrial enlargement.   History of TIA (transient ischemic attack) 2005   Carotid Dopplers January 2014 negative for significant stenosis.   Hypertension    Hypertensive heart disease    OSA on CPAP    use DME- Choice titration study was on 02/21/13   Pinched nerve in neck  Terminal aortic occlusion (HCC) 01/24/2013   Status post embolectomy    Past Surgical History:  Procedure Laterality Date   CARDIAC CATHETERIZATION  Nov 2009   right and left  heart cath ,cardiac output 6.3 by FICK AND 5.34 by thermal  diluation.norm RV pressures and nonobstructive cor disease. no shunt.some intramyocardial bridgingof the LAD   CAROTID DOPPLERS  Nov 2009   done for TIA which were normal   DOPPLER ECHOCARDIOGRAPHY  02/2011; 12/2012   a) EF40-45%; LA mod to severe dilated ;; b) EF 55-60%, mild LVH, Mild-Mod LA  dilation   EMBOLECTOMY  01/23/2013   Procedure: EMBOLECTOMY;  Surgeon: Nada Libman, MD;  Location: [facility] OR;  Service: Vascular;  Laterality: Bilateral;  Bilateral femoral Embolectomy, Bilateral Iliac Embolectomy.   KNEE SURGERY      Social History   Socioeconomic History   Marital status: Divorced    Spouse name: Not on file   Number of children: Not on file   Years of education: Not on file   Highest education level: Not on file  Occupational History   Not on file  Tobacco Use   Smoking status: Never   Smokeless tobacco: Never  Substance and Sexual Activity   Alcohol use: No    Alcohol/week: 0.0 standard drinks of alcohol   Drug use: No   Sexual activity: Yes    Comment: married  Other Topics Concern   Not on file  Social History Narrative   Not on file   Social Determinants of Health   Financial Resource Strain: Not on file  Food Insecurity: Not on file  Transportation Needs: Not on file  Physical Activity: Not on file  Stress: Not on file  Social Connections: Not on file  Intimate Partner Violence: Not on file    Family History  Problem Relation Age of Onset   Hypertension Mother    Stroke Mother    Hypertension Father    Stroke Father    Hypertension Sister    Hypertension Brother    Hypertension Sister      Review of Systems  Constitutional: Negative.  Negative for chills and fever.  HENT: Negative.  Negative for congestion and sore throat.   Respiratory: Negative.  Negative for cough and shortness of breath.   Cardiovascular:  Negative for chest pain and palpitations.  Gastrointestinal:  Negative for abdominal pain, diarrhea, nausea and vomiting.  Genitourinary: Negative.  Negative for dysuria and hematuria.  Skin: Negative.  Negative for rash.  Neurological:  Negative for dizziness and headaches.  All other systems reviewed and are negative.   Vitals:   05/31/23 1019  BP: 128/76  Pulse: 60  Temp: 98.1 F (36.7 C)  SpO2: 98%    Physical  Exam Vitals reviewed.  Constitutional:      Appearance: Normal appearance.  HENT:     Head: Normocephalic.  Eyes:     Extraocular Movements: Extraocular movements intact.     Pupils: Pupils are equal, round, and reactive to light.  Cardiovascular:     Rate and Rhythm: Normal rate. Rhythm irregular.     Pulses: Normal pulses.     Heart sounds: Normal heart sounds.  Pulmonary:     Effort: Pulmonary effort is normal.     Breath sounds: Normal breath sounds.  Musculoskeletal:     Cervical back: No tenderness.     Comments: Feet: Warm to touch.  No erythema or ecchymosis.  Normal skin.  Good distal peripheral pulses.  Good capillary refill.  Decreased sensation to tip of toes bilaterally  Lymphadenopathy:     Cervical: No cervical adenopathy.  Skin:    General: Skin is warm and dry.  Neurological:     General: No focal deficit present.     Mental Status: He is alert and oriented to person, place, and time.  Psychiatric:        Mood and Affect: Mood normal.        Behavior: Behavior normal.      ASSESSMENT & PLAN: A total of 47 minutes was spent with the patient and counseling/coordination of care regarding preparing for this visit, review of most recent office visit notes, review of multiple chronic medical conditions under management, review of all medications, review of most recent blood work results, education on nutrition, differential diagnosis of feet neuropathy, prognosis, documentation and need for follow-up.  Problem List Items Addressed This Visit       Cardiovascular and Mediastinum   Atrial fibrillation, permanent (HCC) (Chronic)    Chronic atrial fibrillation.  Asymptomatic Well-controlled rate with nebivolol 20 mg daily Continues daily Xarelto 20 mg Fall precautions given      Relevant Medications   Nebivolol HCl 20 MG TABS   rivaroxaban (XARELTO) 20 MG TABS tablet   rosuvastatin (CRESTOR) 10 MG tablet   Other Relevant Orders   CBC with  Differential/Platelet   Essential hypertension (Chronic)    BP Readings from Last 3 Encounters:  05/31/23 128/76  12/22/22 130/80  12/18/22 (!) 147/101  Well-controlled hypertension continue nebivolol 20 mg daily Not taking amlodipine at present time Cardiovascular risks associated with hypertension discussed. Dietary approaches to stop hypertension discussed       Relevant Medications   Nebivolol HCl 20 MG TABS   rivaroxaban (XARELTO) 20 MG TABS tablet   rosuvastatin (CRESTOR) 10 MG tablet   Other Relevant Orders   CBC with Differential/Platelet   Atherosclerotic cardiovascular disease    Diet and nutrition discussed Continue rosuvastatin 10 mg daily      Relevant Medications   Nebivolol HCl 20 MG TABS   rivaroxaban (XARELTO) 20 MG TABS tablet   rosuvastatin (CRESTOR) 10 MG tablet   Nonischemic cardiomyopathy (HCC)    No signs of congestive heart failure Stable.  No anginal episodes.      Relevant Medications   Nebivolol HCl 20 MG TABS   rivaroxaban (XARELTO) 20 MG TABS tablet   rosuvastatin (CRESTOR) 10 MG tablet     Nervous and Auditory   Neuropathy of both feet - Primary    May benefit from trial of gabapentin 300 mg at bedtime Has history of peripheral arterial circulation.  Benign examination today. Recommend podiatry evaluation Referral placed today      Relevant Medications   gabapentin (NEURONTIN) 300 MG capsule   Other Relevant Orders   Ambulatory referral to Podiatry   Hemoglobin A1c     Genitourinary   Stage 3b chronic kidney disease (HCC)    Advised to stay well-hydrated and avoid NSAIDs Blood work done today      Relevant Orders   Comprehensive metabolic panel   CBC with Differential/Platelet     Other   Dyslipidemia    Diet and nutrition discussed Benefits of exercise discussed Continue rosuvastatin 10 mg daily      Relevant Medications   rosuvastatin (CRESTOR) 10 MG tablet   Other Relevant Orders   CBC with Differential/Platelet    Lipid panel   Current use of long term anticoagulation    No clinical bleeding  episodes Continue Xarelto 20 mg daily Fall precautions discussed      History of gout    No recent gout flareups Continues allopurinol 300 mg daily Uric acid level done today along with CMP      Relevant Orders   Uric acid   Other Visit Diagnoses     Need for hepatitis C screening test       Relevant Orders   Hepatitis C antibody      Patient Instructions  Peripheral Neuropathy Peripheral neuropathy is a type of nerve damage. It affects nerves that carry signals between the spinal cord and the arms, legs, and the rest of the body (peripheral nerves). It does not affect nerves in the spinal cord or brain. In peripheral neuropathy, one nerve or a group of nerves may be damaged. Peripheral neuropathy is a broad category that includes many specific nerve disorders, like diabetic neuropathy, hereditary neuropathy, and carpal tunnel syndrome. What are the causes? This condition may be caused by: Certain diseases, such as: Diabetes. This is the most common cause of peripheral neuropathy. Autoimmune diseases, such as rheumatoid arthritis and systemic lupus erythematosus. Nerve diseases that are passed from parent to child (inherited). Kidney disease. Thyroid disease. Other causes may include: Nerve injury. Pressure or stress on a nerve that lasts a long time. Lack (deficiency) of B vitamins. This can result from alcoholism, poor diet, or a restricted diet. Infections. Some medicines, such as cancer medicines (chemotherapy). Poisonous (toxic) substances, such as lead and mercury. Too little blood flowing to the legs. In some cases, the cause of this condition is not known. What are the signs or symptoms? Symptoms of this condition depend on which of your nerves is damaged. Symptoms in the legs, hands, and arms can include: Loss of feeling (numbness) in the feet, hands, or both. Tingling in the  feet, hands, or both. Burning pain. Very sensitive skin. Weakness. Not being able to move a part of the body (paralysis). Clumsiness or poor coordination. Muscle twitching. Loss of balance. Symptoms in other parts of the body can include: Not being able to control your bladder. Feeling dizzy. Sexual problems. How is this diagnosed? Diagnosing and finding the cause of peripheral neuropathy can be difficult. Your health care provider will take your medical history and do a physical exam. A neurological exam will also be done. This involves checking things that are affected by your brain, spinal cord, and nerves (nervous system). For example, your health care provider will check your reflexes, how you move, and what you can feel. You may have other tests, such as: Blood tests. Electromyogram (EMG) and nerve conduction tests. These tests check nerve function and how well the nerves are controlling the muscles. Imaging tests, such as a CT scan or MRI, to rule out other causes of your symptoms. Removing a small piece of nerve to be examined in a lab (nerve biopsy). Removing and examining a small amount of the fluid that surrounds the brain and spinal cord (lumbar puncture). How is this treated? Treatment for this condition may involve: Treating the underlying cause of the neuropathy, such as diabetes, kidney disease, or vitamin deficiencies. Stopping medicines that can cause neuropathy, such as chemotherapy. Medicine to help relieve pain. Medicines may include: Prescription or over-the-counter pain medicine. Anti-seizure medicine. Antidepressants. Pain-relieving patches that are applied to painful areas of skin. Surgery to relieve pressure on a nerve or to destroy a nerve that is causing pain. Physical therapy to help improve movement and balance. Devices  to help you move around (assistive devices). Follow these instructions at home: Medicines Take over-the-counter and prescription  medicines only as told by your health care provider. Do not take any other medicines without first asking your health care provider. Ask your health care provider if the medicine prescribed to you requires you to avoid driving or using machinery. Lifestyle  Do not use any products that contain nicotine or tobacco. These products include cigarettes, chewing tobacco, and vaping devices, such as e-cigarettes. Smoking keeps blood from reaching damaged nerves. If you need help quitting, ask your health care provider. Avoid or limit alcohol. Too much alcohol can cause a vitamin B deficiency, and vitamin B is needed for healthy nerves. Eat a healthy diet. This includes: Eating foods that are high in fiber, such as beans, whole grains, and fresh fruits and vegetables. Limiting foods that are high in fat and processed sugars, such as fried or sweet foods. General instructions  If you have diabetes, work closely with your health care provider to keep your blood sugar under control. If you have numbness in your feet: Check every day for signs of injury or infection. Watch for redness, warmth, and swelling. Wear padded socks and comfortable shoes. These help protect your feet. Develop a good support system. Living with peripheral neuropathy can be stressful. Consider talking with a mental health specialist or joining a support group. Use assistive devices and attend physical therapy as told by your health care provider. This may include using a walker or a cane. Keep all follow-up visits. This is important. Where to find more information [employer] of Neurological Disorders: [employer] Contact a health care provider if: You have new signs or symptoms of peripheral neuropathy. You are struggling emotionally from dealing with peripheral neuropathy. Your pain is not well controlled. Get help right away if: You have an injury or infection that is not healing normally. You develop new  weakness in an arm or leg. You have fallen or do so frequently. Summary Peripheral neuropathy is when the nerves in the arms or legs are damaged, resulting in numbness, weakness, or pain. There are many causes of peripheral neuropathy, including diabetes, pinched nerves, vitamin deficiencies, autoimmune disease, and hereditary conditions. Diagnosing and finding the cause of peripheral neuropathy can be difficult. Your health care provider will take your medical history, do a physical exam, and do tests, including blood tests and nerve function tests. Treatment involves treating the underlying cause of the neuropathy and taking medicines to help control pain. Physical therapy and assistive devices may also help. This information is not intended to replace advice given to you by your health care provider. Make sure you discuss any questions you have with your health care provider. Document Revised: 08/18/2021 Document Reviewed: 08/18/2021 Elsevier Patient Education  2024 Elsevier Inc.      Edwina Barth, MD [facility]

## 2023-05-31 NOTE — Telephone Encounter (Signed)
All medications sent to patient requested pharmacy

## 2023-05-31 NOTE — Patient Instructions (Signed)
Peripheral Neuropathy Peripheral neuropathy is a type of nerve damage. It affects nerves that carry signals between the spinal cord and the arms, legs, and the rest of the body (peripheral nerves). It does not affect nerves in the spinal cord or brain. In peripheral neuropathy, one nerve or a group of nerves may be damaged. Peripheral neuropathy is a broad category that includes many specific nerve disorders, like diabetic neuropathy, hereditary neuropathy, and carpal tunnel syndrome. What are the causes? This condition may be caused by: Certain diseases, such as: Diabetes. This is the most common cause of peripheral neuropathy. Autoimmune diseases, such as rheumatoid arthritis and systemic lupus erythematosus. Nerve diseases that are passed from parent to child (inherited). Kidney disease. Thyroid disease. Other causes may include: Nerve injury. Pressure or stress on a nerve that lasts a long time. Lack (deficiency) of B vitamins. This can result from alcoholism, poor diet, or a restricted diet. Infections. Some medicines, such as cancer medicines (chemotherapy). Poisonous (toxic) substances, such as lead and mercury. Too little blood flowing to the legs. In some cases, the cause of this condition is not known. What are the signs or symptoms? Symptoms of this condition depend on which of your nerves is damaged. Symptoms in the legs, hands, and arms can include: Loss of feeling (numbness) in the feet, hands, or both. Tingling in the feet, hands, or both. Burning pain. Very sensitive skin. Weakness. Not being able to move a part of the body (paralysis). Clumsiness or poor coordination. Muscle twitching. Loss of balance. Symptoms in other parts of the body can include: Not being able to control your bladder. Feeling dizzy. Sexual problems. How is this diagnosed? Diagnosing and finding the cause of peripheral neuropathy can be difficult. Your health care provider will take your  medical history and do a physical exam. A neurological exam will also be done. This involves checking things that are affected by your brain, spinal cord, and nerves (nervous system). For example, your health care provider will check your reflexes, how you move, and what you can feel. You may have other tests, such as: Blood tests. Electromyogram (EMG) and nerve conduction tests. These tests check nerve function and how well the nerves are controlling the muscles. Imaging tests, such as a CT scan or MRI, to rule out other causes of your symptoms. Removing a small piece of nerve to be examined in a lab (nerve biopsy). Removing and examining a small amount of the fluid that surrounds the brain and spinal cord (lumbar puncture). How is this treated? Treatment for this condition may involve: Treating the underlying cause of the neuropathy, such as diabetes, kidney disease, or vitamin deficiencies. Stopping medicines that can cause neuropathy, such as chemotherapy. Medicine to help relieve pain. Medicines may include: Prescription or over-the-counter pain medicine. Anti-seizure medicine. Antidepressants. Pain-relieving patches that are applied to painful areas of skin. Surgery to relieve pressure on a nerve or to destroy a nerve that is causing pain. Physical therapy to help improve movement and balance. Devices to help you move around (assistive devices). Follow these instructions at home: Medicines Take over-the-counter and prescription medicines only as told by your health care provider. Do not take any other medicines without first asking your health care provider. Ask your health care provider if the medicine prescribed to you requires you to avoid driving or using machinery. Lifestyle  Do not use any products that contain nicotine or tobacco. These products include cigarettes, chewing tobacco, and vaping devices, such as e-cigarettes. Smoking keeps   blood from reaching damaged nerves. If you  need help quitting, ask your health care provider. Avoid or limit alcohol. Too much alcohol can cause a vitamin B deficiency, and vitamin B is needed for healthy nerves. Eat a healthy diet. This includes: Eating foods that are high in fiber, such as beans, whole grains, and fresh fruits and vegetables. Limiting foods that are high in fat and processed sugars, such as fried or sweet foods. General instructions  If you have diabetes, work closely with your health care provider to keep your blood sugar under control. If you have numbness in your feet: Check every day for signs of injury or infection. Watch for redness, warmth, and swelling. Wear padded socks and comfortable shoes. These help protect your feet. Develop a good support system. Living with peripheral neuropathy can be stressful. Consider talking with a mental health specialist or joining a support group. Use assistive devices and attend physical therapy as told by your health care provider. This may include using a walker or a cane. Keep all follow-up visits. This is important. Where to find more information [employer] of Neurological Disorders: www.ninds.nih.gov Contact a health care provider if: You have new signs or symptoms of peripheral neuropathy. You are struggling emotionally from dealing with peripheral neuropathy. Your pain is not well controlled. Get help right away if: You have an injury or infection that is not healing normally. You develop new weakness in an arm or leg. You have fallen or do so frequently. Summary Peripheral neuropathy is when the nerves in the arms or legs are damaged, resulting in numbness, weakness, or pain. There are many causes of peripheral neuropathy, including diabetes, pinched nerves, vitamin deficiencies, autoimmune disease, and hereditary conditions. Diagnosing and finding the cause of peripheral neuropathy can be difficult. Your health care provider will take your medical  history, do a physical exam, and do tests, including blood tests and nerve function tests. Treatment involves treating the underlying cause of the neuropathy and taking medicines to help control pain. Physical therapy and assistive devices may also help. This information is not intended to replace advice given to you by your health care provider. Make sure you discuss any questions you have with your health care provider. Document Revised: 08/18/2021 Document Reviewed: 08/18/2021 Elsevier Patient Education  2024 Elsevier Inc.  

## 2023-05-31 NOTE — Assessment & Plan Note (Signed)
BP Readings from Last 3 Encounters:  05/31/23 128/76  12/22/22 130/80  12/18/22 (!) 147/101  Well-controlled hypertension continue nebivolol 20 mg daily Not taking amlodipine at present time Cardiovascular risks associated with hypertension discussed. Dietary approaches to stop hypertension discussed

## 2023-05-31 NOTE — Assessment & Plan Note (Signed)
Advised to stay well-hydrated and avoid NSAIDs. Blood work done today. 

## 2023-05-31 NOTE — Assessment & Plan Note (Signed)
No recent gout flareups Continues allopurinol 300 mg daily Uric acid level done today along with CMP

## 2023-05-31 NOTE — Assessment & Plan Note (Signed)
No signs of congestive heart failure Stable.  No anginal episodes.

## 2023-05-31 NOTE — Assessment & Plan Note (Signed)
>>  ASSESSMENT AND PLAN FOR STAGE 3B CHRONIC KIDNEY DISEASE (HCC) WRITTEN ON 05/31/2023 11:52 AM BY SAGARDIA, MIGUEL JOSE, MD  Advised to stay well-hydrated and avoid NSAIDs Blood work done today

## 2023-05-31 NOTE — Assessment & Plan Note (Addendum)
May benefit from trial of gabapentin 300 mg at bedtime Has history of peripheral arterial circulation.  Benign examination today. Recommend podiatry evaluation Referral placed today

## 2023-05-31 NOTE — Telephone Encounter (Signed)
Patient states that he would [facility] [address], [place], [place] [address] from now on.

## 2023-05-31 NOTE — Assessment & Plan Note (Signed)
No clinical bleeding episodes Continue Xarelto 20 mg daily Fall precautions discussed

## 2023-05-31 NOTE — Assessment & Plan Note (Addendum)
Diet and nutrition discussed Benefits of exercise discussed Continue rosuvastatin 10 mg daily

## 2023-06-01 LAB — HEMOGLOBIN A1C
Hgb A1c MFr Bld: 5.8 % of total Hgb — ABNORMAL HIGH (ref <5.7)
Mean Plasma Glucose: 120 mg/dL
eAG (mmol/L): 6.6 mmol/L

## 2023-06-01 LAB — HEPATITIS C ANTIBODY: Hepatitis C Ab: NONREACTIVE

## 2023-06-09 ENCOUNTER — Ambulatory Visit (INDEPENDENT_AMBULATORY_CARE_PROVIDER_SITE_OTHER): Payer: 59 | Admitting: Podiatry

## 2023-06-09 DIAGNOSIS — Z86718 Personal history of other venous thrombosis and embolism: Secondary | ICD-10-CM

## 2023-06-09 DIAGNOSIS — M792 Neuralgia and neuritis, unspecified: Secondary | ICD-10-CM

## 2023-06-09 MED ORDER — GABAPENTIN 300 MG PO CAPS: 300.0000 mg | ORAL_CAPSULE | Freq: Two times a day (BID) | ORAL | 3 refills | Status: AC

## 2023-06-09 NOTE — Progress Notes (Signed)
Subjective:  Patient ID: Isaiah [name], male    DOB: 1961-11-21,  MRN: [id number]  Chief Complaint  Patient presents with   Numbness    Rm 23  Bilateral numbness and stinging pain in the bottom of both feet x 6 months. Pt also complains of numbness in most toes. Pt was placed on Gabapentin and he states it helps only a little bit. A1C 5.8 9 days ago.     62 y.o. male presents with concern for bilateral numbness and stinging pain in the bottom of both feet.  He says it has been going on for 6 months.  He has been placed on gabapentin recently for this by his primary care doctor 300 mg once daily at night.  He says it is started to have effect he only started taking it approximately 1 week ago.  He does report  past history of having blood clots in both legs that needed removal with surgery and end arterectomy.  He says that at the time he had these clots he was told that he may have some chronic nerve damage from these clots and swelling that he had at the time.  Past Medical History:  Diagnosis Date   Atrial fibrillation, permanent (HCC) 01/24/2013   chronic,previuosly refuses coumadin,now on xarelto   CKD (chronic kidney disease) stage 3, GFR 30-59 ml/min (HCC) 01/24/2013   Baseline creatinine roughly 1.7-1.8.   Erectile dysfunction    Extremity ischemia, critical bil lower ext. 01/24/2013   Status post bilateral common femoral, profunda femoral and superficial femoral arterial embolectomy   Gout    H/O right and left cardiac catheterization November 2009   30-40% RCA disease, cardiac output Fick 6.3, thermodilution 5.3.  Normal artery pressures.   Heart murmur    History of Non-ischemic cardiomyopathy 2009 - 2014   EF previously as low as 35-40% in 2009, up to 40 and 45% by 2012.  Repeat echo January 2014: Echo EF 55-60% mild LVH, mild to moderate left atrial enlargement, mild right atrial enlargement.   History of TIA (transient ischemic attack) 2005   Carotid Dopplers January 2014  negative for significant stenosis.   Hypertension    Hypertensive heart disease    OSA on CPAP    use DME- Choice titration study was on 02/21/13   Pinched nerve in neck    Terminal aortic occlusion (HCC) 01/24/2013   Status post embolectomy    Allergies  Allergen Reactions   Codeine Itching   Ace Inhibitors Swelling    Swelling of legs    ROS: Negative except as per HPI above  Objective:  General: AAO x3, NAD  Dermatological: With inspection and palpation of the right and left lower extremities there are no open sores, no preulcerative lesions, no rash or signs of infection present. Nails are of normal length thickness and coloration.   Vascular:  Dorsalis Pedis artery and Posterior Tibial artery pedal pulses are 2/4 bilateral.  Capillary fill time < 3 sec to all digits.   Neruologic: Subjective sensation of pins-and-needles burning tingling pain in both feet.  Musculoskeletal: No gross boney pedal deformities bilateral. No pain, crepitus, or limitation noted with foot and ankle range of motion bilateral. Muscular strength 5/5 in all groups tested bilateral.  Gait: Unassisted, Nonantalgic.   No images are attached to the encounter.  Radiographs:  Deferred Assessment:   1. Neuropathic pain   2. History of blood clots      Plan:  Patient was evaluated and treated and  all questions answered.  # Neuropathic pain likely secondary to nerve damage from prior significant edema in the bilateral lower extremity from arterial clotting -Discussed with patient that there is evidence of neuropathic pain likely related to the issue he had with his bilateral lower extremity in the past from swelling -I recommend we increase the dosage of gabapentin from 300 mg once a day to 300 mg twice daily in the morning and at night. -I discussed the risk and side effects associated with gabapentin including risk of drowsiness. -No evidence of residual or recurrent arterial vascular disease in  the bilateral lower extremity has strong pedal pulses PT and DP bilateral  Return in about 6 weeks (around 07/21/2023) for Follow-up neuropathic pain.          Corinna Gab, DPM [facility] / [facility]

## 2023-06-16 ENCOUNTER — Telehealth: Payer: Self-pay | Admitting: Emergency Medicine

## 2023-06-16 NOTE — Telephone Encounter (Signed)
Patient called and requested Xarelto samples. He said his card hasn't arrived yet. He would like a call back at [phone].

## 2023-06-17 NOTE — Telephone Encounter (Signed)
Called patient and told him that we have samples of Xarelto. Samples will be upfront

## 2023-06-20 ENCOUNTER — Other Ambulatory Visit: Payer: Self-pay | Admitting: Emergency Medicine

## 2023-06-20 DIAGNOSIS — I10 Essential (primary) hypertension: Secondary | ICD-10-CM

## 2023-07-07 ENCOUNTER — Telehealth: Payer: Self-pay | Admitting: Emergency Medicine

## 2023-07-07 ENCOUNTER — Other Ambulatory Visit: Payer: Self-pay | Admitting: Emergency Medicine

## 2023-07-07 MED ORDER — PREDNISONE 20 MG PO TABS: 40.0000 mg | ORAL_TABLET | Freq: Every day | ORAL | 0 refills | Status: DC

## 2023-07-07 NOTE — Telephone Encounter (Signed)
Thanks.  New prescription for prednisone sent to pharmacy of record today.  Thanks.

## 2023-07-07 NOTE — Telephone Encounter (Signed)
Notified pt rx has been sent to [facility].Marland KitchenRaechel [name]

## 2023-07-07 NOTE — Telephone Encounter (Signed)
Prescription Request  07/07/2023  LOV: 05/31/2023  What is the name of the medication or equipment?  predniSONE (DELTASONE) 10 MG tablet    Have you contacted your pharmacy to request a refill? No   Which pharmacy would you like this sent to?    [id number] [place], [place] - [address] AT [facility] [address] [place] [address] Phone: [phone] Fax: [phone]  Patient notified that their request is being sent to the clinical staff for review and that they should receive a response within 2 business days.   Please advise at Mobile [phone] (mobile)

## 2023-07-07 NOTE — Telephone Encounter (Signed)
Called pt he states he is having a gout flare up.. been\ taking the colchicine not helping...Raechel Chute

## 2023-07-07 NOTE — Telephone Encounter (Signed)
Prednisone was given by [facility] [place], [place] is this ok.Marland KitchenRaechel Chute

## 2023-07-07 NOTE — Telephone Encounter (Signed)
What is the prednisone used for in his case?

## 2023-08-04 ENCOUNTER — Other Ambulatory Visit: Payer: Self-pay | Admitting: Emergency Medicine

## 2023-08-04 ENCOUNTER — Telehealth: Payer: Self-pay | Admitting: Emergency Medicine

## 2023-08-04 DIAGNOSIS — M109 Gout, unspecified: Secondary | ICD-10-CM

## 2023-08-04 MED ORDER — COLCHICINE 0.6 MG PO TABS
ORAL_TABLET | ORAL | 1 refills | Status: AC
Start: 2023-08-04 — End: ?

## 2023-08-04 MED ORDER — METHYLPREDNISOLONE 4 MG PO TBPK: ORAL_TABLET | ORAL | 1 refills | Status: DC

## 2023-08-04 NOTE — Telephone Encounter (Signed)
Patient called and said that his gout is flaring up again. He would like Dr. Alvy Bimler to call in some medication.    [facility]  Patient was seen in June, 2024  Please call patient and let him know:  [phone]

## 2023-08-04 NOTE — Telephone Encounter (Signed)
New prescription sent to pharmacy of record today.  Thanks.

## 2023-08-11 LAB — LAB REPORT - SCANNED
Albumin, Urine POC: 832.4
Creatinine, POC: 184.4 mg/dL
EGFR: 17
Microalb Creat Ratio: 451

## 2023-08-17 ENCOUNTER — Encounter: Payer: Self-pay | Admitting: Nephrology

## 2023-08-24 ENCOUNTER — Other Ambulatory Visit: Payer: Self-pay | Admitting: Emergency Medicine

## 2023-09-13 LAB — LAB REPORT - SCANNED
Albumin, Urine POC: 1015.6
Albumin/Creatinine Ratio, Urine, POC: 523
Creatinine, POC: 194.2 mg/dL
EGFR: 20

## 2023-09-19 NOTE — Progress Notes (Signed)
Is he scheduled to see nephrologist?

## 2023-09-20 NOTE — Progress Notes (Signed)
Make sure referral went through and he is scheduled.  Thanks.

## 2023-09-21 ENCOUNTER — Other Ambulatory Visit: Payer: Self-pay | Admitting: *Deleted

## 2023-09-21 DIAGNOSIS — N183 Chronic kidney disease, stage 3 unspecified: Secondary | ICD-10-CM

## 2023-09-21 NOTE — Progress Notes (Signed)
Thanks

## 2023-09-24 ENCOUNTER — Other Ambulatory Visit: Payer: Self-pay | Admitting: Emergency Medicine

## 2023-09-24 DIAGNOSIS — M109 Gout, unspecified: Secondary | ICD-10-CM

## 2023-10-26 ENCOUNTER — Other Ambulatory Visit: Payer: Self-pay | Admitting: Emergency Medicine

## 2023-10-27 ENCOUNTER — Encounter: Payer: Self-pay | Admitting: Emergency Medicine

## 2023-10-27 ENCOUNTER — Ambulatory Visit (INDEPENDENT_AMBULATORY_CARE_PROVIDER_SITE_OTHER): Payer: 59 | Admitting: Emergency Medicine

## 2023-10-27 VITALS — BP 138/98 | HR 74 | Temp 97.9°F | Ht 73.5 in | Wt 210.0 lb

## 2023-10-27 DIAGNOSIS — Z8739 Personal history of other diseases of the musculoskeletal system and connective tissue: Secondary | ICD-10-CM

## 2023-10-27 DIAGNOSIS — M79671 Pain in right foot: Secondary | ICD-10-CM | POA: Insufficient documentation

## 2023-10-27 MED ORDER — CEFADROXIL 500 MG PO CAPS: 500.0000 mg | ORAL_CAPSULE | Freq: Two times a day (BID) | ORAL | 0 refills | Status: AC

## 2023-10-27 MED ORDER — COLCHICINE 0.6 MG PO TABS
ORAL_TABLET | ORAL | 1 refills | Status: DC
Start: 2023-10-27 — End: 2024-01-18

## 2023-10-27 MED ORDER — PREDNISONE 20 MG PO TABS: 20.0000 mg | ORAL_TABLET | Freq: Every day | ORAL | 1 refills | Status: AC

## 2023-10-27 NOTE — Assessment & Plan Note (Signed)
Differential diagnosis discussed with patient Pain management discussed Patient on Xarelto, possible hematoma.  Unknown injury. Possible early cellulitis.  Will start antibiotics History of gout.  Doubt flareup but will benefit from colchicine and prednisone

## 2023-10-27 NOTE — Progress Notes (Signed)
Isaiah [name] 62 y.o.   Chief Complaint  Patient presents with   gout flare up    Gout flare up right foot, started Tuesday night     HISTORY OF PRESENT ILLNESS: This is a 62 y.o. male complaining of pain to right foot that started 2 days ago.  Possible gout flareup. Patient is on Xarelto Denies injury No other associated symptoms No other complaints or medical concerns today.  HPI   Prior to Admission medications   Medication Sig Start Date End Date Taking? Authorizing Provider  allopurinol (ZYLOPRIM) 300 MG tablet TAKE 1/2 TABLET(150 MG) BY MOUTH DAILY 05/31/23  Yes Antonette Hendricks, Eilleen Kempf, MD  colchicine 0.6 MG tablet TAKE 2 TABLETS BY MOUTH NOW THEN TAKE 1 TABLET 1 HOUR LATER THEN 1 DAILY AFTER THAT FOR 5 DAYS 09/25/23  Yes Morgaine Kimball, Eilleen Kempf, MD  gabapentin (NEURONTIN) 300 MG capsule Take 1 capsule (300 mg total) by mouth at bedtime. 05/31/23  Yes Kyomi Hector, Eilleen Kempf, MD  gabapentin (NEURONTIN) 300 MG capsule Take 1 capsule (300 mg total) by mouth 2 (two) times daily. 06/09/23  Yes Standiford, Jenelle Mages, DPM  methylPREDNISolone (MEDROL DOSEPAK) 4 MG TBPK tablet Sig as indicated 08/04/23  Yes Judith Demps, Eilleen Kempf, MD  Nebivolol HCl 20 MG TABS TAKE 1 TABLET(20 MG) BY MOUTH DAILY FOR BLOOD PRESSURE 06/20/23  Yes Mahiya Kercheval, Eilleen Kempf, MD  rivaroxaban (XARELTO) 20 MG TABS tablet Take 1 tablet (20 mg total) by mouth daily with supper. 05/31/23  Yes Mikaiya Tramble, Eilleen Kempf, MD  rosuvastatin (CRESTOR) 10 MG tablet Take 1 tablet (10 mg total) by mouth daily. 05/31/23  Yes Georgina Quint, MD    Allergies  Allergen Reactions   Codeine Itching   Ace Inhibitors Swelling    Swelling of legs    Patient Active Problem List   Diagnosis Date Noted   Nonischemic cardiomyopathy (HCC) 05/31/2023   Neuropathy of both feet 05/31/2023   History of gout 05/31/2023   Current use of long term anticoagulation 07/28/2021   Stage 3b chronic kidney disease (HCC) 12/05/2020   Hypertensive heart  disease    Dyslipidemia 02/02/2015   Obesity (BMI 30-39.9) 09/10/2013   Acute gouty arthritis 03/09/2013   Atrial fibrillation, permanent (HCC) 01/24/2013   Essential hypertension 01/24/2013   Erectile dysfunction 01/24/2013   Atherosclerotic cardiovascular disease 01/24/2013    Past Medical History:  Diagnosis Date   Atrial fibrillation, permanent (HCC) 01/24/2013   chronic,previuosly refuses coumadin,now on xarelto   CKD (chronic kidney disease) stage 3, GFR 30-59 ml/min (HCC) 01/24/2013   Baseline creatinine roughly 1.7-1.8.   Erectile dysfunction    Extremity ischemia, critical bil lower ext. 01/24/2013   Status post bilateral common femoral, profunda femoral and superficial femoral arterial embolectomy   Gout    H/O right and left cardiac catheterization November 2009   30-40% RCA disease, cardiac output Fick 6.3, thermodilution 5.3.  Normal artery pressures.   Heart murmur    History of Non-ischemic cardiomyopathy 2009 - 2014   EF previously as low as 35-40% in 2009, up to 40 and 45% by 2012.  Repeat echo January 2014: Echo EF 55-60% mild LVH, mild to moderate left atrial enlargement, mild right atrial enlargement.   History of TIA (transient ischemic attack) 2005   Carotid Dopplers January 2014 negative for significant stenosis.   Hypertension    Hypertensive heart disease    OSA on CPAP    use DME- Choice titration study was on 02/21/13   Pinched nerve in  neck    Terminal aortic occlusion (HCC) 01/24/2013   Status post embolectomy    Past Surgical History:  Procedure Laterality Date   CARDIAC CATHETERIZATION  Nov 2009   right and left  heart cath ,cardiac output 6.3 by FICK AND 5.34 by thermal  diluation.norm RV pressures and nonobstructive cor disease. no shunt.some intramyocardial bridgingof the LAD   CAROTID DOPPLERS  Nov 2009   done for TIA which were normal   DOPPLER ECHOCARDIOGRAPHY  02/2011; 12/2012   a) EF40-45%; LA mod to severe dilated ;; b) EF 55-60%, mild LVH,  Mild-Mod LA dilation   EMBOLECTOMY  01/23/2013   Procedure: EMBOLECTOMY;  Surgeon: Nada Libman, MD;  Location: [facility] OR;  Service: Vascular;  Laterality: Bilateral;  Bilateral femoral Embolectomy, Bilateral Iliac Embolectomy.   KNEE SURGERY      Social History   Socioeconomic History   Marital status: Divorced    Spouse name: Not on file   Number of children: Not on file   Years of education: Not on file   Highest education level: Not on file  Occupational History   Not on file  Tobacco Use   Smoking status: Never   Smokeless tobacco: Never  Substance and Sexual Activity   Alcohol use: No    Alcohol/week: 0.0 standard drinks of alcohol   Drug use: No   Sexual activity: Yes    Comment: married  Other Topics Concern   Not on file  Social History Narrative   Not on file   Social Determinants of Health   Financial Resource Strain: Not on file  Food Insecurity: Not on file  Transportation Needs: Not on file  Physical Activity: Not on file  Stress: Not on file  Social Connections: Not on file  Intimate Partner Violence: Not on file    Family History  Problem Relation Age of Onset   Hypertension Mother    Stroke Mother    Hypertension Father    Stroke Father    Hypertension Sister    Hypertension Brother    Hypertension Sister      Review of Systems  Constitutional: Negative.  Negative for chills and fever.  HENT: Negative.  Negative for congestion, nosebleeds and sore throat.   Respiratory: Negative.  Negative for cough, hemoptysis and shortness of breath.   Cardiovascular: Negative.  Negative for chest pain and palpitations.  Gastrointestinal:  Negative for abdominal pain, blood in stool, nausea and vomiting.  Genitourinary:  Negative for hematuria.  Skin: Negative.  Negative for rash.  Neurological: Negative.  Negative for dizziness and headaches.  All other systems reviewed and are negative.   Vitals:   10/27/23 1404  BP: (!) 138/98  Pulse: 74  Temp:  97.9 F (36.6 C)  SpO2: 100%    Physical Exam Vitals reviewed.  Constitutional:      Appearance: Normal appearance.  HENT:     Head: Normocephalic.  Eyes:     Extraocular Movements: Extraocular movements intact.  Cardiovascular:     Rate and Rhythm: Normal rate.  Pulmonary:     Effort: Pulmonary effort is normal.  Musculoskeletal:     Comments: Right foot: Positive erythema, ecchymosis, swelling and tenderness to lateral aspect of dorsal foot.  Neurovascularly intact  Skin:    General: Skin is warm and dry.  Neurological:     Mental Status: He is alert and oriented to person, place, and time.  Psychiatric:        Mood and Affect: Mood normal.  Behavior: Behavior normal.      ASSESSMENT & PLAN: A total of 32 minutes was spent with the patient and counseling/coordination of care regarding preparing for this visit, review of most recent office visit notes, review of chronic medical conditions under management, review of all medications, differential diagnosis of foot pain, pain management, prognosis, documentation and need for follow-up if no better or worse during the next several days.  Problem List Items Addressed This Visit       Other   History of gout   Relevant Medications   colchicine 0.6 MG tablet   Right foot pain - Primary    Differential diagnosis discussed with patient Pain management discussed Patient on Xarelto, possible hematoma.  Unknown injury. Possible early cellulitis.  Will start antibiotics History of gout.  Doubt flareup but will benefit from colchicine and prednisone      Relevant Medications   colchicine 0.6 MG tablet   cefadroxil (DURICEF) 500 MG capsule   Patient Instructions  Foot Pain Many things can cause foot pain. Common causes include injuries to the foot. The injuries include sprains or broken bones, or injuries that affect the nerves in the feet. Other causes of foot pain include arthritis, blisters, and bunions. To know  what causes your foot pain, your health care provider will take a detailed history of your symptoms. They will also do a physical exam as well as imaging tests, such as X-ray or MRI. Follow these instructions at home: Managing pain, stiffness, and swelling  If told, put ice on the painful area. Put ice in a plastic bag. Place a towel between your skin and the bag. Leave the ice on for 20 minutes, 2-3 times a day. If your skin turns bright red, remove the ice right away to prevent skin damage. The risk of damage is higher if you cannot feel pain, heat, or cold. Activity Do not stand or walk for long periods. Do stretches to relieve foot pain and stiffness as told by your provider. Do not lift anything that is heavier than 10 lb (4.5 kg), or the limit that you are told, until your provider says that it is safe. Lifting a lot of weight can put added pressure on your feet. Return to your normal activities as told by your provider. Ask your provider what activities are safe for you. Lifestyle Wear comfortable, supportive shoes that fit you well. Do not wear high heels. Keep your feet clean and dry. General instructions Take over-the-counter and prescription medicines only as told by your provider. Rub your foot gently. Pay attention to any changes in your symptoms. Let your provider know if symptoms become worse. Keep all follow-up visits. Your provider will want to monitor your progress. Contact a health care provider if: Your pain does not get better after a few days of treatment at home. Your pain gets worse. You cannot stand on your foot. Your foot or toes are swollen. Your foot is numb or tingling. Get help right away if: Your foot or toes turn white or blue. You have warmth and redness along your foot. This information is not intended to replace advice given to you by your health care provider. Make sure you discuss any questions you have with your health care provider. Document  Revised: 01/06/2023 Document Reviewed: 09/14/2022 Elsevier Patient Education  2024 Elsevier Inc.     Edwina Barth, MD [facility]

## 2023-10-27 NOTE — Patient Instructions (Signed)
Foot Pain Many things can cause foot pain. Common causes include injuries to the foot. The injuries include sprains or broken bones, or injuries that affect the nerves in the feet. Other causes of foot pain include arthritis, blisters, and bunions. To know what causes your foot pain, your health care provider will take a detailed history of your symptoms. They will also do a physical exam as well as imaging tests, such as X-ray or MRI. Follow these instructions at home: Managing pain, stiffness, and swelling  If told, put ice on the painful area. Put ice in a plastic bag. Place a towel between your skin and the bag. Leave the ice on for 20 minutes, 2-3 times a day. If your skin turns bright red, remove the ice right away to prevent skin damage. The risk of damage is higher if you cannot feel pain, heat, or cold. Activity Do not stand or walk for long periods. Do stretches to relieve foot pain and stiffness as told by your provider. Do not lift anything that is heavier than 10 lb (4.5 kg), or the limit that you are told, until your provider says that it is safe. Lifting a lot of weight can put added pressure on your feet. Return to your normal activities as told by your provider. Ask your provider what activities are safe for you. Lifestyle Wear comfortable, supportive shoes that fit you well. Do not wear high heels. Keep your feet clean and dry. General instructions Take over-the-counter and prescription medicines only as told by your provider. Rub your foot gently. Pay attention to any changes in your symptoms. Let your provider know if symptoms become worse. Keep all follow-up visits. Your provider will want to monitor your progress. Contact a health care provider if: Your pain does not get better after a few days of treatment at home. Your pain gets worse. You cannot stand on your foot. Your foot or toes are swollen. Your foot is numb or tingling. Get help right away if: Your foot  or toes turn white or blue. You have warmth and redness along your foot. This information is not intended to replace advice given to you by your health care provider. Make sure you discuss any questions you have with your health care provider. Document Revised: 01/06/2023 Document Reviewed: 09/14/2022 Elsevier Patient Education  2024 Elsevier Inc.  

## 2023-10-31 ENCOUNTER — Encounter: Payer: Self-pay | Admitting: Emergency Medicine

## 2023-11-04 ENCOUNTER — Telehealth: Payer: Self-pay | Admitting: *Deleted

## 2023-11-04 NOTE — Telephone Encounter (Signed)
Called patient and left message for patient to come by the office to pick up FMLA paperwork, patient needs to file out his portion of the FMLA form, once done please make a copy for his chart

## 2023-11-07 NOTE — Telephone Encounter (Signed)
Patient has picked up forms, copy has been made for our records.

## 2023-11-30 ENCOUNTER — Ambulatory Visit: Payer: 59 | Admitting: Emergency Medicine

## 2024-01-18 ENCOUNTER — Other Ambulatory Visit: Payer: Self-pay | Admitting: Emergency Medicine

## 2024-01-18 DIAGNOSIS — Z8739 Personal history of other diseases of the musculoskeletal system and connective tissue: Secondary | ICD-10-CM

## 2024-01-18 DIAGNOSIS — M79671 Pain in right foot: Secondary | ICD-10-CM

## 2024-03-02 ENCOUNTER — Ambulatory Visit: Payer: Self-pay | Admitting: Emergency Medicine

## 2024-03-02 ENCOUNTER — Other Ambulatory Visit: Payer: Self-pay | Admitting: Emergency Medicine

## 2024-03-02 DIAGNOSIS — M79671 Pain in right foot: Secondary | ICD-10-CM

## 2024-03-02 DIAGNOSIS — Z8739 Personal history of other diseases of the musculoskeletal system and connective tissue: Secondary | ICD-10-CM

## 2024-03-02 NOTE — Telephone Encounter (Signed)
 Copied from CRM [id number]. Topic: Clinical - Medication Refill >> Mar 02, 2024 12:06 PM Patsy Lager T wrote: Most Recent Primary Care Visit:  Provider: Georgina Quint  Department: [facility]  Visit Type: OFFICE VISIT  Date: 10/27/2023  Medication: colchicine 0.6 MG tablet and predniSONE (DELTASONE) 20 MG tablet  Has the patient contacted their pharmacy? No  Is this the correct pharmacy for this prescription? Yes   This is the patient's preferred pharmacy:  [id number] [place] [place] - [address] AT [facility] [address] [place] [address] Phone: [phone] Fax: [phone]  Has the prescription been filled recently? Yes  Is the patient out of the medication? Yes  Has the patient been seen for an appointment in the last year OR does the patient have an upcoming appointment? Yes  Can we respond through MyChart? Yes  Agent: Please be advised that Rx refills may take up to 3 business days. We ask that you follow-up with your pharmacy.

## 2024-03-02 NOTE — Telephone Encounter (Signed)
  Chief Complaint: Gout flare Symptoms: pain and numbness - mostly left foot and ankle , but also right big toe Frequency: weds Pertinent Negatives: Patient denies fever Disposition: [] ED /[] Urgent Care (no appt availability in office) / [] Appointment(In office/virtual)/ []  [facility]/ [] Home Care/ [] Refused Recommended Disposition /[] [facility]/  F[x] ollow-up with PCP Additional Notes: Pt has had gout in the past, last flare was 2 months ago. Pt thinks this is another gout flare. Pt was prescribed colchicine and prednisone in the past for this. Pt has a few colchicine left , but would like more. He has already used the refill. Pt would also like prednisone prescribed. Pt would prefer to not come into the office for this. He has made an appt for Monday. Please advise.  Copied from CRM [id number]. Topic: Clinical - Medical Advice >> Mar 02, 2024  8:04 AM Elizebeth Brooking wrote: Reason for CRM: Patient called in stated that he is having a gout flareup, wanted to know if there was anyway that a something can be sent in regarding this. He is requesting a callback on this matter Reason for Disposition  [1] SEVERE pain (e.g., excruciating, unable to do any normal activities) AND [2] not improved after 2 hours of pain medicine  Answer Assessment - Initial Assessment Questions 1. ONSET: "When did the pain start?"      Weds 2. LOCATION: "Where is the pain located?"      Left foot mostly and right big toe 3. PAIN: "How bad is the pain?"    (Scale 1-10; or mild, moderate, severe)  - MILD (1-3): doesn't interfere with normal activities.   - MODERATE (4-7): interferes with normal activities (e.g., work or school) or awakens from sleep, limping.   - SEVERE (8-10): excruciating pain, unable to do any normal activities, unable to walk.      10/10 4. WORK OR EXERCISE: "Has there been any recent work or exercise that involved this part of the body?"      gout 5. CAUSE: "What do you think is  causing the foot pain?"     gout 6. OTHER SYMPTOMS: "Do you have any other symptoms?" (e.g., leg pain, rash, fever, numbness)     Numbness  Protocols used: Foot Pain-A-AH

## 2024-03-02 NOTE — Telephone Encounter (Signed)
 Last Fill: 01/18/24  Last OV: 10/27/23 Next OV: 03/05/24  Routing to provider for review/authorization.

## 2024-03-05 ENCOUNTER — Ambulatory Visit: Admitting: Emergency Medicine

## 2024-03-05 ENCOUNTER — Encounter: Payer: Self-pay | Admitting: Emergency Medicine

## 2024-03-05 VITALS — BP 132/80 | HR 100 | Temp 97.8°F | Ht 73.5 in | Wt 204.0 lb

## 2024-03-05 DIAGNOSIS — Z7901 Long term (current) use of anticoagulants: Secondary | ICD-10-CM

## 2024-03-05 DIAGNOSIS — I119 Hypertensive heart disease without heart failure: Secondary | ICD-10-CM

## 2024-03-05 DIAGNOSIS — N184 Chronic kidney disease, stage 4 (severe): Secondary | ICD-10-CM | POA: Insufficient documentation

## 2024-03-05 DIAGNOSIS — I10 Essential (primary) hypertension: Secondary | ICD-10-CM | POA: Diagnosis not present

## 2024-03-05 DIAGNOSIS — M109 Gout, unspecified: Secondary | ICD-10-CM

## 2024-03-05 DIAGNOSIS — E785 Hyperlipidemia, unspecified: Secondary | ICD-10-CM

## 2024-03-05 DIAGNOSIS — I4821 Permanent atrial fibrillation: Secondary | ICD-10-CM

## 2024-03-05 DIAGNOSIS — I2510 Atherosclerotic heart disease of native coronary artery without angina pectoris: Secondary | ICD-10-CM

## 2024-03-05 DIAGNOSIS — I428 Other cardiomyopathies: Secondary | ICD-10-CM

## 2024-03-05 MED ORDER — PREDNISONE 20 MG PO TABS: 20.0000 mg | ORAL_TABLET | Freq: Every day | ORAL | 2 refills | Status: AC

## 2024-03-05 MED ORDER — EMPAGLIFLOZIN 10 MG PO TABS: 10.0000 mg | ORAL_TABLET | Freq: Every day | ORAL | 3 refills | Status: AC

## 2024-03-05 NOTE — Progress Notes (Signed)
 Isaiah [name] 63 y.o.   Chief Complaint  Patient presents with   Medical Management of Chronic Issues    Recheck gout and has been in pain since Wednesday the pain is a 10     HISTORY OF PRESENT ILLNESS: This is a 63 y.o. male here for follow-up of chronic medical conditions Also complaining of bilateral feet pain left more than right Thinks it is a gout flareup.  Prednisone works for him. Was able to follow-up with nephrologist since her last visit.  Has CKD stage IV.  Was told to prepare for dialysis but he is not willing to do it. No other complaints or medical concerns today.  HPI   Prior to Admission medications   Medication Sig Start Date End Date Taking? Authorizing Provider  allopurinol (ZYLOPRIM) 300 MG tablet TAKE 1/2 TABLET(150 MG) BY MOUTH DAILY 05/31/23  Yes Dani Danis, Eilleen Kempf, MD  colchicine 0.6 MG tablet TAKE 2 TABLETS BY MOUTH NOW THEN TAKE 1 TABLET 1 HOUR LATER THEN 1 DAILY AFTER THAT FOR 5 DAYS 01/18/24  Yes Araceli Coufal, Eilleen Kempf, MD  gabapentin (NEURONTIN) 300 MG capsule Take 1 capsule (300 mg total) by mouth at bedtime. 05/31/23  Yes Kera Deacon, Eilleen Kempf, MD  gabapentin (NEURONTIN) 300 MG capsule Take 1 capsule (300 mg total) by mouth 2 (two) times daily. 06/09/23  Yes Standiford, Jenelle Mages, DPM  Nebivolol HCl 20 MG TABS TAKE 1 TABLET(20 MG) BY MOUTH DAILY FOR BLOOD PRESSURE 06/20/23  Yes Romell Wolden, Eilleen Kempf, MD  rivaroxaban (XARELTO) 20 MG TABS tablet Take 1 tablet (20 mg total) by mouth daily with supper. 05/31/23  Yes Virginia Curl, Eilleen Kempf, MD  rosuvastatin (CRESTOR) 10 MG tablet Take 1 tablet (10 mg total) by mouth daily. 05/31/23  Yes Georgina Quint, MD    Allergies  Allergen Reactions   Codeine Itching   Ace Inhibitors Swelling    Swelling of legs    Patient Active Problem List   Diagnosis Date Noted   Right foot pain 10/27/2023   Nonischemic cardiomyopathy (HCC) 05/31/2023   Neuropathy of both feet 05/31/2023   History of gout 05/31/2023    Current use of long term anticoagulation 07/28/2021   Stage 3b chronic kidney disease (HCC) 12/05/2020   Hypertensive heart disease    Dyslipidemia 02/02/2015   Obesity (BMI 30-39.9) 09/10/2013   Acute gouty arthritis 03/09/2013   Atrial fibrillation, permanent (HCC) 01/24/2013   Essential hypertension 01/24/2013   Erectile dysfunction 01/24/2013   Atherosclerotic cardiovascular disease 01/24/2013    Past Medical History:  Diagnosis Date   Atrial fibrillation, permanent (HCC) 01/24/2013   chronic,previuosly refuses coumadin,now on xarelto   CKD (chronic kidney disease) stage 3, GFR 30-59 ml/min (HCC) 01/24/2013   Baseline creatinine roughly 1.7-1.8.   Erectile dysfunction    Extremity ischemia, critical bil lower ext. 01/24/2013   Status post bilateral common femoral, profunda femoral and superficial femoral arterial embolectomy   Gout    H/O right and left cardiac catheterization November 2009   30-40% RCA disease, cardiac output Fick 6.3, thermodilution 5.3.  Normal artery pressures.   Heart murmur    History of Non-ischemic cardiomyopathy 2009 - 2014   EF previously as low as 35-40% in 2009, up to 40 and 45% by 2012.  Repeat echo January 2014: Echo EF 55-60% mild LVH, mild to moderate left atrial enlargement, mild right atrial enlargement.   History of TIA (transient ischemic attack) 2005   Carotid Dopplers January 2014 negative for significant stenosis.  Hypertension    Hypertensive heart disease    OSA on CPAP    use DME- Choice titration study was on 02/21/13   Pinched nerve in neck    Terminal aortic occlusion (HCC) 01/24/2013   Status post embolectomy    Past Surgical History:  Procedure Laterality Date   CARDIAC CATHETERIZATION  Nov 2009   right and left  heart cath ,cardiac output 6.3 by FICK AND 5.34 by thermal  diluation.norm RV pressures and nonobstructive cor disease. no shunt.some intramyocardial bridgingof the LAD   CAROTID DOPPLERS  Nov 2009   done for TIA  which were normal   DOPPLER ECHOCARDIOGRAPHY  02/2011; 12/2012   a) EF40-45%; LA mod to severe dilated ;; b) EF 55-60%, mild LVH, Mild-Mod LA dilation   EMBOLECTOMY  01/23/2013   Procedure: EMBOLECTOMY;  Surgeon: Nada Libman, MD;  Location: [facility] OR;  Service: Vascular;  Laterality: Bilateral;  Bilateral femoral Embolectomy, Bilateral Iliac Embolectomy.   KNEE SURGERY      Social History   Socioeconomic History   Marital status: Divorced    Spouse name: Not on file   Number of children: Not on file   Years of education: Not on file   Highest education level: Not on file  Occupational History   Not on file  Tobacco Use   Smoking status: Never   Smokeless tobacco: Never  Substance and Sexual Activity   Alcohol use: No    Alcohol/week: 0.0 standard drinks of alcohol   Drug use: No   Sexual activity: Yes    Comment: married  Other Topics Concern   Not on file  Social History Narrative   Not on file   Social Drivers of Health   Financial Resource Strain: Not on file  Food Insecurity: Not on file  Transportation Needs: Not on file  Physical Activity: Not on file  Stress: Not on file  Social Connections: Not on file  Intimate Partner Violence: Not on file    Family History  Problem Relation Age of Onset   Hypertension Mother    Stroke Mother    Hypertension Father    Stroke Father    Hypertension Sister    Hypertension Brother    Hypertension Sister      Review of Systems  Constitutional: Negative.  Negative for chills and fever.  HENT: Negative.  Negative for congestion and sore throat.   Respiratory: Negative.  Negative for cough and shortness of breath.   Cardiovascular: Negative.  Negative for chest pain and palpitations.  Gastrointestinal:  Negative for abdominal pain, diarrhea, nausea and vomiting.  Genitourinary: Negative.  Negative for dysuria and hematuria.  Skin: Negative.  Negative for rash.  Neurological: Negative.  Negative for dizziness and  headaches.  All other systems reviewed and are negative.   Today's Vitals   03/05/24 1428  BP: 132/80  Pulse: 100  Temp: 97.8 F (36.6 C)  TempSrc: Oral  SpO2: 97%  Weight: 204 lb (92.5 kg)  Height: 6' 1.5" (1.867 m)   Body mass index is 26.55 kg/m.   Physical Exam Vitals reviewed.  Constitutional:      Appearance: Normal appearance.  HENT:     Head: Normocephalic.     Mouth/Throat:     Mouth: Mucous membranes are moist.     Pharynx: Oropharynx is clear.  Eyes:     Extraocular Movements: Extraocular movements intact.     Pupils: Pupils are equal, round, and reactive to light.  Cardiovascular:  Rate and Rhythm: Normal rate and regular rhythm.     Pulses: Normal pulses.     Heart sounds: Normal heart sounds.  Pulmonary:     Effort: Pulmonary effort is normal.     Breath sounds: Normal breath sounds.  Abdominal:     Palpations: Abdomen is soft.     Tenderness: There is no abdominal tenderness.  Musculoskeletal:     Cervical back: No tenderness.     Comments: Feet: Heel tenderness on left side as well as ankle  Lymphadenopathy:     Cervical: No cervical adenopathy.  Skin:    General: Skin is warm and dry.     Capillary Refill: Capillary refill takes less than 2 seconds.  Neurological:     General: No focal deficit present.     Mental Status: He is alert and oriented to person, place, and time.  Psychiatric:        Mood and Affect: Mood normal.        Behavior: Behavior normal.      ASSESSMENT & PLAN: A total of 45 minutes was spent with the patient and counseling/coordination of care regarding preparing for this visit, review of most recent office visit notes, review of multiple chronic medical conditions and their management, review of all medications, review of most recent bloodwork results, review of health maintenance items, education on nutrition, prognosis, documentation, and need for follow up.   Problem List Items Addressed This Visit        Cardiovascular and Mediastinum   Atrial fibrillation, permanent (HCC) (Chronic)   Chronic atrial fibrillation.  Asymptomatic Well-controlled rate with nebivolol 20 mg daily Continues daily Xarelto 20 mg Fall precautions given      Essential hypertension - Primary (Chronic)   BP Readings from Last 3 Encounters:  03/05/24 132/80  10/27/23 (!) 138/98  05/31/23 128/76  Well-controlled hypertension Continues nebivolol 20 mg daily Cardiovascular risks associated with hypertension discussed Diet and nutrition discussed       Atherosclerotic cardiovascular disease   Diet and nutrition discussed Continue rosuvastatin 10 mg daily      Hypertensive heart disease   Well-controlled hypertension. No signs of congestive heart failure       Nonischemic cardiomyopathy (HCC)   No signs of congestive heart failure Stable.  No anginal episodes.        Musculoskeletal and Integument   Acute gouty arthritis   Not taking allopurinol due to chronic kidney disease Recommend prednisone 20 mg daily      Relevant Medications   predniSONE (DELTASONE) 20 MG tablet     Genitourinary   Stage 4 chronic kidney disease (HCC)   Was seen and evaluated by nephrology Was told to start preparing for dialysis Patient not ready for dialysis.  Refusing it at this time. May benefit from starting Farxiga or Jardiance daily      Relevant Medications   empagliflozin (JARDIANCE) 10 MG TABS tablet     Other   Dyslipidemia   Diet and nutrition discussed Benefits of exercise discussed Continue rosuvastatin 10 mg dail      Current use of long term anticoagulation   No clinical bleeding episodes Continue Xarelto 20 mg daily Fall precautions discussed        Patient Instructions  Gout  Gout is painful swelling of your joints. Gout is a type of arthritis. It is caused by having too much uric acid in your body. Uric acid is a chemical that is made when your body breaks down substances  called  purines. If your body has too much uric acid, sharp crystals can form and build up in your joints. This causes pain and swelling. Gout attacks can happen quickly and be very painful (acute gout). Over time, the attacks can affect more joints and happen more often (chronic gout). What are the causes? Gout is caused by too much uric acid in your blood. This can happen because: Your kidneys do not remove enough uric acid from your blood. Your body makes too much uric acid. You eat too many foods that are high in purines. These foods include organ meats, some seafood, and beer. Trauma or stress can bring on an attack. What increases the risk? Having a family history of gout. Being male and middle-aged. Being male and having gone through menopause. Having an organ transplant. Taking certain medicines. Having certain conditions, such as: Being very overweight (obese). Lead poisoning. Kidney disease. A skin condition called psoriasis. Other risks include: Losing weight too quickly. Not having enough water in the body (being dehydrated). Drinking alcohol, especially beer. Drinking beverages that are sweetened with a type of sugar called fructose. What are the signs or symptoms? An attack of acute gout often starts at night and usually happens in just one joint. The most common place is the big toe. Other joints that may be affected include joints of the feet, ankle, knee, fingers, wrist, or elbow. Symptoms may include: Very bad pain. Warmth. Swelling. Stiffness. Tenderness. The affected joint may be very painful to touch. Shiny, red, or purple skin. Chills and fever. Chronic gout may cause symptoms more often. More joints may be involved. You may also have white or yellow lumps (tophi) on your hands or feet or in other areas near your joints. How is this treated? Treatment for an acute attack may include medicines for pain and swelling, such as: NSAIDs, such as ibuprofen. Steroids  taken by mouth or injected into a joint. Colchicine. This can be given by mouth or through an IV tube. Treatment to prevent future attacks may include: Taking small doses of NSAIDs or colchicine daily. Using a medicine that reduces uric acid levels in your blood, such as allopurinol. Making changes to your diet. You may need to see a food expert (dietitian) about what to eat and drink to prevent gout. Follow these instructions at home: During a gout attack  If told, put ice on the painful area. To do this: Put ice in a plastic bag. Place a towel between your skin and the bag. Leave the ice on for 20 minutes, 2-3 times a day. Take off the ice if your skin turns bright red. This is very important. If you cannot feel pain, heat, or cold, you have a greater risk of damage to the area. Raise the painful joint above the level of your heart as often as you can. Rest the joint as much as possible. If the joint is in your leg, you may be given crutches. Follow instructions from your doctor about what you cannot eat or drink. Avoiding future gout attacks Eat a low-purine diet. Avoid foods and drinks such as: Liver. Kidney. Anchovies. Asparagus. Herring. Mushrooms. Mussels. Beer. Stay at a healthy weight. If you want to lose weight, talk with your doctor. Do not lose weight too fast. Start or continue an exercise plan as told by your doctor. Eating and drinking Avoid drinks sweetened by fructose. Drink enough fluids to keep your pee (urine) pale yellow. If you drink alcohol: Limit how much  you have to: 0-1 drink a day for women who are not pregnant. 0-2 drinks a day for men. Know how much alcohol is in a drink. In the [place] one drink equals one 12 oz bottle of beer (355 mL), one 5 oz glass of wine (148 mL), or one 1 oz glass of hard liquor (44 mL). General instructions Take over-the-counter and prescription medicines only as told by your doctor. Ask your doctor if you should avoid  driving or using machines while you are taking your medicine. Return to your normal activities when your doctor says that it is safe. Keep all follow-up visits. Where to find more information [employer]: www.niams.http://www.myers.net/ Contact a doctor if: You have another gout attack. You still have symptoms of a gout attack after 10 days of treatment. You have problems (side effects) because of your medicines. You have chills or a fever. You have burning pain when you pee (urinate). You have pain in your lower back or belly. Get help right away if: You have very bad pain. Your pain cannot be controlled. You cannot pee. Summary Gout is painful swelling of the joints. The most common site of pain is the big toe, but it can affect other joints. Medicines and avoiding some foods can help to prevent and treat gout attacks. This information is not intended to replace advice given to you by your health care provider. Make sure you discuss any questions you have with your health care provider. Document Revised: 09/16/2021 Document Reviewed: 09/16/2021 Elsevier Patient Education  2024 Elsevier Inc.    Edwina Barth, MD [facility]

## 2024-03-05 NOTE — Assessment & Plan Note (Signed)
 BP Readings from Last 3 Encounters:  03/05/24 132/80  10/27/23 (!) 138/98  05/31/23 128/76  Well-controlled hypertension Continues nebivolol 20 mg daily Cardiovascular risks associated with hypertension discussed Diet and nutrition discussed

## 2024-03-05 NOTE — Assessment & Plan Note (Signed)
 Diet and nutrition discussed. Continue rosuvastatin 10 mg daily.

## 2024-03-05 NOTE — Patient Instructions (Signed)
 Gout  Gout is painful swelling of your joints. Gout is a type of arthritis. It is caused by having too much uric acid in your body. Uric acid is a chemical that is made when your body breaks down substances called purines. If your body has too much uric acid, sharp crystals can form and build up in your joints. This causes pain and swelling. Gout attacks can happen quickly and be very painful (acute gout). Over time, the attacks can affect more joints and happen more often (chronic gout). What are the causes? Gout is caused by too much uric acid in your blood. This can happen because: Your kidneys do not remove enough uric acid from your blood. Your body makes too much uric acid. You eat too many foods that are high in purines. These foods include organ meats, some seafood, and beer. Trauma or stress can bring on an attack. What increases the risk? Having a family history of gout. Being male and middle-aged. Being male and having gone through menopause. Having an organ transplant. Taking certain medicines. Having certain conditions, such as: Being very overweight (obese). Lead poisoning. Kidney disease. A skin condition called psoriasis. Other risks include: Losing weight too quickly. Not having enough water in the body (being dehydrated). Drinking alcohol, especially beer. Drinking beverages that are sweetened with a type of sugar called fructose. What are the signs or symptoms? An attack of acute gout often starts at night and usually happens in just one joint. The most common place is the big toe. Other joints that may be affected include joints of the feet, ankle, knee, fingers, wrist, or elbow. Symptoms may include: Very bad pain. Warmth. Swelling. Stiffness. Tenderness. The affected joint may be very painful to touch. Shiny, red, or purple skin. Chills and fever. Chronic gout may cause symptoms more often. More joints may be involved. You may also have white or yellow lumps  (tophi) on your hands or feet or in other areas near your joints. How is this treated? Treatment for an acute attack may include medicines for pain and swelling, such as: NSAIDs, such as ibuprofen. Steroids taken by mouth or injected into a joint. Colchicine. This can be given by mouth or through an IV tube. Treatment to prevent future attacks may include: Taking small doses of NSAIDs or colchicine daily. Using a medicine that reduces uric acid levels in your blood, such as allopurinol. Making changes to your diet. You may need to see a food expert (dietitian) about what to eat and drink to prevent gout. Follow these instructions at home: During a gout attack  If told, put ice on the painful area. To do this: Put ice in a plastic bag. Place a towel between your skin and the bag. Leave the ice on for 20 minutes, 2-3 times a day. Take off the ice if your skin turns bright red. This is very important. If you cannot feel pain, heat, or cold, you have a greater risk of damage to the area. Raise the painful joint above the level of your heart as often as you can. Rest the joint as much as possible. If the joint is in your leg, you may be given crutches. Follow instructions from your doctor about what you cannot eat or drink. Avoiding future gout attacks Eat a low-purine diet. Avoid foods and drinks such as: Liver. Kidney. Anchovies. Asparagus. Herring. Mushrooms. Mussels. Beer. Stay at a healthy weight. If you want to lose weight, talk with your doctor. Do not  lose weight too fast. Start or continue an exercise plan as told by your doctor. Eating and drinking Avoid drinks sweetened by fructose. Drink enough fluids to keep your pee (urine) pale yellow. If you drink alcohol: Limit how much you have to: 0-1 drink a day for women who are not pregnant. 0-2 drinks a day for men. Know how much alcohol is in a drink. In the [place] one drink equals one 12 oz bottle of beer (355 mL), one 5 oz  glass of wine (148 mL), or one 1 oz glass of hard liquor (44 mL). General instructions Take over-the-counter and prescription medicines only as told by your doctor. Ask your doctor if you should avoid driving or using machines while you are taking your medicine. Return to your normal activities when your doctor says that it is safe. Keep all follow-up visits. Where to find more information [employer]: www.niams.http://www.myers.net/ Contact a doctor if: You have another gout attack. You still have symptoms of a gout attack after 10 days of treatment. You have problems (side effects) because of your medicines. You have chills or a fever. You have burning pain when you pee (urinate). You have pain in your lower back or belly. Get help right away if: You have very bad pain. Your pain cannot be controlled. You cannot pee. Summary Gout is painful swelling of the joints. The most common site of pain is the big toe, but it can affect other joints. Medicines and avoiding some foods can help to prevent and treat gout attacks. This information is not intended to replace advice given to you by your health care provider. Make sure you discuss any questions you have with your health care provider. Document Revised: 09/16/2021 Document Reviewed: 09/16/2021 Elsevier Patient Education  2024 [employer].

## 2024-03-05 NOTE — Assessment & Plan Note (Signed)
No clinical bleeding episodes Continue Xarelto 20 mg daily Fall precautions discussed

## 2024-03-05 NOTE — Assessment & Plan Note (Signed)
 Advised to stay well-hydrated and avoid NSAIDs. Blood work done today.

## 2024-03-05 NOTE — Assessment & Plan Note (Signed)
 Diet and nutrition discussed Benefits of exercise discussed Continue rosuvastatin 10 mg dail

## 2024-03-05 NOTE — Assessment & Plan Note (Signed)
 Well-controlled hypertension. No signs of congestive heart failure

## 2024-03-05 NOTE — Assessment & Plan Note (Signed)
 Not taking allopurinol due to chronic kidney disease Recommend prednisone 20 mg daily

## 2024-03-05 NOTE — Assessment & Plan Note (Signed)
No signs of congestive heart failure Stable.  No anginal episodes.

## 2024-03-05 NOTE — Assessment & Plan Note (Addendum)
>>  ASSESSMENT AND PLAN FOR CKD (CHRONIC KIDNEY DISEASE) STAGE 4, GFR 15-29 ML/MIN (HCC) WRITTEN ON 03/05/2024  2:50 PM BY Cienna Dumais JOSE, MD  Was seen and evaluated by nephrology Was told to start preparing for dialysis Patient not ready for dialysis.  Refusing it at this time. May benefit from starting Farxiga or Jardiance  daily   >>ASSESSMENT AND PLAN FOR STAGE 3B CHRONIC KIDNEY DISEASE (HCC) WRITTEN ON 03/05/2024  2:39 PM BY Jaselynn Tamas JOSE, MD  Advised to stay well-hydrated and avoid NSAIDs Blood work done today

## 2024-03-05 NOTE — Assessment & Plan Note (Signed)
Chronic atrial fibrillation.  Asymptomatic Well-controlled rate with nebivolol 20 mg daily Continues daily Xarelto 20 mg Fall precautions given

## 2024-03-06 MED ORDER — COLCHICINE 0.6 MG PO TABS: ORAL_TABLET | ORAL | 1 refills | Status: DC

## 2024-06-17 ENCOUNTER — Other Ambulatory Visit: Payer: Self-pay | Admitting: Emergency Medicine

## 2024-06-17 DIAGNOSIS — Z8739 Personal history of other diseases of the musculoskeletal system and connective tissue: Secondary | ICD-10-CM

## 2024-06-17 DIAGNOSIS — I10 Essential (primary) hypertension: Secondary | ICD-10-CM

## 2024-06-17 DIAGNOSIS — M79671 Pain in right foot: Secondary | ICD-10-CM

## 2024-07-23 ENCOUNTER — Other Ambulatory Visit: Payer: Self-pay | Admitting: Emergency Medicine

## 2024-07-23 DIAGNOSIS — I4821 Permanent atrial fibrillation: Secondary | ICD-10-CM

## 2024-07-25 ENCOUNTER — Other Ambulatory Visit: Payer: Self-pay | Admitting: Emergency Medicine

## 2024-07-25 DIAGNOSIS — M109 Gout, unspecified: Secondary | ICD-10-CM

## 2024-09-03 ENCOUNTER — Encounter: Payer: Self-pay | Admitting: Family Medicine

## 2024-09-03 ENCOUNTER — Ambulatory Visit: Payer: Self-pay

## 2024-09-03 ENCOUNTER — Ambulatory Visit: Payer: Self-pay | Admitting: Family Medicine

## 2024-09-03 ENCOUNTER — Telehealth: Payer: Self-pay

## 2024-09-03 ENCOUNTER — Ambulatory Visit: Admitting: Family Medicine

## 2024-09-03 VITALS — BP 144/90 | HR 60 | Temp 98.1°F | Ht 74.0 in | Wt 210.0 lb

## 2024-09-03 DIAGNOSIS — M109 Gout, unspecified: Secondary | ICD-10-CM

## 2024-09-03 DIAGNOSIS — M10371 Gout due to renal impairment, right ankle and foot: Secondary | ICD-10-CM | POA: Diagnosis not present

## 2024-09-03 DIAGNOSIS — I10 Essential (primary) hypertension: Secondary | ICD-10-CM

## 2024-09-03 DIAGNOSIS — N184 Chronic kidney disease, stage 4 (severe): Secondary | ICD-10-CM

## 2024-09-03 DIAGNOSIS — G5793 Unspecified mononeuropathy of bilateral lower limbs: Secondary | ICD-10-CM

## 2024-09-03 LAB — BASIC METABOLIC PANEL WITH GFR
BUN: 31 mg/dL — ABNORMAL HIGH (ref 6–23)
CO2: 24 meq/L (ref 19–32)
Calcium: 10.6 mg/dL — ABNORMAL HIGH (ref 8.4–10.5)
Chloride: 105 meq/L (ref 96–112)
Creatinine, Ser: 4.07 mg/dL — ABNORMAL HIGH (ref 0.40–1.50)
GFR: 15 mL/min — CL (ref >60.00)
Glucose, Bld: 98 mg/dL (ref 70–99)
Potassium: 4.2 meq/L (ref 3.5–5.1)
Sodium: 136 meq/L (ref 135–145)

## 2024-09-03 MED ORDER — PREDNISONE 10 MG (21) PO TBPK: ORAL_TABLET | ORAL | 0 refills | Status: DC

## 2024-09-03 MED ORDER — ALLOPURINOL 100 MG PO TABS: 50.0000 mg | ORAL_TABLET | ORAL | 2 refills | Status: AC

## 2024-09-03 NOTE — Progress Notes (Signed)
 Assessment & Plan Acute gout due to renal impairment involving right ankle Gout flare, right ankle in the setting of chronic kidney disease stage 4. Advised to discontinue use of Colchicine  due to chronic kidney disease. Prednisone  preferred for acute management. Labs prior to restarting Allopurinol  at a dosage appropriate for his kidney function. Education provided on low-purine diet.  Orders:   predniSONE  (STERAPRED UNI-PAK 21 TAB) 10 MG (21) TBPK tablet; Use as directed.  CKD (chronic kidney disease) stage 4, GFR 15-29 ml/min (HCC) Labs to reassess as he hasn't had blood work in almost a year. Avoid use of Colchicine .  Orders:   Basic metabolic panel with GFR; Future  Essential hypertension Blood pressure elevated initially, reduced after rest. Current nebivolol  regimen effective for usual readings.    Neuropathy of both feet Previous gabapentin  trial discontinued due to rash and itching. Advised to follow-up with PCP for ongoing management.      Follow up plan: Return for chronic follow-up with PCP (patient is pastdue).  Niki Rung, MSN, APRN, FNP-C  Subjective:  HPI: Isaiah [name] is a 63 y.o. male presenting on 09/03/2024 for Foot Swelling (Right ankle, pain started Friday meds are not helping /Haven't been taking meds because gout hasn't flared up in a while)  Discussed the use of AI scribe software for clinical note transcription with the patient, who gave verbal consent to proceed.  History of Present Illness   Isaiah [name] is a 63 year old male with gout who presents with right ankle pain.  He has been experiencing severe right ankle pain since Friday, initially rated at 10 out of 10, now reduced to 8 out of 10 with colchicine  treatment. He took two tablets initially, followed by one an hour later, and then one tablet daily. He has a history of gout flares, with the last episode occurring approximately two months ago. He has previously used prednisone  for pain  management but currently does not have any. He has not been taking allopurinol  as he was told by his PCP not to take it due to chronic kidney disease. He has not had lab work in almost a year.   He also has neuropathy and previously took gabapentin , which he discontinued due to side effects, including itching and a rash on his leg. He works a night shift and was taking gabapentin  in the morning after his shift.  He is currently taking nebivolol  for blood pressure management, which he takes early in the morning after his night shift. His blood pressure readings at work are typically around 130/80 mmHg, though it was elevated during the visit, possibly due to pain or waiting time.      ROS: Negative unless specifically indicated above in HPI.   Relevant past medical history reviewed and updated as indicated.   Allergies and medications reviewed and updated.   Current Outpatient Medications:    Nebivolol  HCl 20 MG TABS, TAKE 1 TABLET BY MOUTH EVERY DAY FOR BLOOD PRESSURE, Disp: 90 tablet, Rfl: 3   predniSONE  (STERAPRED UNI-PAK 21 TAB) 10 MG (21) TBPK tablet, Use as directed., Disp: 21 each, Rfl: 0   XARELTO  20 MG TABS tablet, TAKE 1 TABLET BY MOUTH DAILY WITH SUPPER., Disp: 90 tablet, Rfl: 3   allopurinol  (ZYLOPRIM ) 300 MG tablet, TAKE 1/2 TABLET(150 MG) BY MOUTH DAILY (Patient not taking: Reported on 09/03/2024), Disp: 45 tablet, Rfl: 1   empagliflozin  (JARDIANCE ) 10 MG TABS tablet, Take 1 tablet (10 mg total) by mouth daily before breakfast. (  Patient not taking: Reported on 09/03/2024), Disp: 90 tablet, Rfl: 3   gabapentin  (NEURONTIN ) 300 MG capsule, Take 1 capsule (300 mg total) by mouth at bedtime. (Patient not taking: Reported on 09/03/2024), Disp: 90 capsule, Rfl: 3   gabapentin  (NEURONTIN ) 300 MG capsule, Take 1 capsule (300 mg total) by mouth 2 (two) times daily. (Patient not taking: Reported on 09/03/2024), Disp: 90 capsule, Rfl: 3   rosuvastatin  (CRESTOR ) 10 MG tablet, Take 1 tablet (10 mg  total) by mouth daily. (Patient not taking: Reported on 09/03/2024), Disp: 90 tablet, Rfl: 3  Allergies  Allergen Reactions   Codeine Itching   Ace Inhibitors Swelling    Swelling of legs    Objective:   BP (!) 144/90   Pulse 60   Temp 98.1 F (36.7 C) (Oral)   Ht 6' 2 (1.88 m)   Wt 210 lb (95.3 kg)   BMI 26.96 kg/m    Physical Exam Vitals reviewed.  Constitutional:      General: He is not in acute distress.    Appearance: Normal appearance. He is not ill-appearing, toxic-appearing or diaphoretic.  HENT:     Head: Normocephalic and atraumatic.  Eyes:     General: No scleral icterus.       Right eye: No discharge.        Left eye: No discharge.     Conjunctiva/sclera: Conjunctivae normal.  Cardiovascular:     Rate and Rhythm: Normal rate.  Pulmonary:     Effort: Pulmonary effort is normal. No respiratory distress.  Musculoskeletal:        General: Normal range of motion.     Cervical back: Normal range of motion.  Feet:     Comments: Mild swelling and erythema accompanied by tenderness to the area around/below the lateral malleolus.  Skin:    General: Skin is warm and dry.  Neurological:     Mental Status: He is alert and oriented to person, place, and time. Mental status is at baseline.  Psychiatric:        Mood and Affect: Mood normal.        Behavior: Behavior normal.        Thought Content: Thought content normal.        Judgment: Judgment normal.

## 2024-09-03 NOTE — Telephone Encounter (Signed)
 CRITICAL VALUE STICKER  CRITICAL VALUE: GFR 15   RECEIVER (on-site recipient of call): Jazz   DATE & TIME NOTIFIED: 09/03/2024 12:34PM   MESSENGER (representative from lab): HOPE   Niki Rung, FNP has been notified.

## 2024-09-03 NOTE — Assessment & Plan Note (Signed)
 Blood pressure elevated initially, reduced after rest. Current nebivolol  regimen effective for usual readings.

## 2024-09-03 NOTE — Telephone Encounter (Signed)
Addressed in result encounter.

## 2024-09-03 NOTE — Assessment & Plan Note (Signed)
 Labs to reassess as he hasn't had blood work in almost a year. Avoid use of Colchicine .  Orders:   Basic metabolic panel with GFR; Future

## 2024-09-03 NOTE — Assessment & Plan Note (Signed)
 Previous gabapentin  trial discontinued due to rash and itching. Advised to follow-up with PCP for ongoing management.

## 2024-09-03 NOTE — Telephone Encounter (Signed)
 FYI Only or Action Required?: FYI only for provider.  Patient was last seen in primary care on 03/05/2024 by Isaiah Emil Schanz, MD.  Called Nurse Triage reporting Foot Swelling.  Symptoms began several days ago.  Interventions attempted: Prescription medications: colchicine .  Symptoms are: unchanged.  Triage Disposition: See PCP When Office is Open (Within 3 Days)  Patient/caregiver understands and will follow disposition?: Yes     Copied from CRM #[id number]. Topic: Clinical - Red Word Triage >> Sep 03, 2024  8:03 AM Treva T wrote: [facility] that prompted transfer to Nurse Triage: Patient calling with gout flare up, with foot and ankle pain. Reason for Disposition  [1] MODERATE pain (e.g., interferes with normal activities, limping) AND [2] present > 3 days  Answer Assessment - Initial Assessment Questions 1. ONSET: When did the pain start?      Friday 2. LOCATION: Where is the pain located?      Right foot 3. PAIN: How bad is the pain?    (Scale 1-10; or mild, moderate, severe)     7.5-8/10 4. WORK OR EXERCISE: Has there been any recent work or exercise that involved this part of the body?      denies 5. CAUSE: What do you think is causing the foot pain?     Gout  6. OTHER SYMPTOMS: Do you have any other symptoms? (e.g., leg pain, rash, fever, numbness)     denies 7. PREGNANCY: Is there any chance you are pregnant? When was your last menstrual period?     na  Protocols used: Foot Pain-A-AH

## 2024-09-13 ENCOUNTER — Ambulatory Visit: Admitting: Family Medicine

## 2024-09-13 ENCOUNTER — Other Ambulatory Visit: Payer: Self-pay | Admitting: Emergency Medicine

## 2024-09-13 ENCOUNTER — Ambulatory Visit: Payer: Self-pay

## 2024-09-13 DIAGNOSIS — M79671 Pain in right foot: Secondary | ICD-10-CM

## 2024-09-13 DIAGNOSIS — Z8739 Personal history of other diseases of the musculoskeletal system and connective tissue: Secondary | ICD-10-CM

## 2024-09-13 NOTE — Telephone Encounter (Signed)
 FYI Only or Action Required?: FYI only for provider.  Patient was last seen in primary care on 09/03/2024 by Merlynn Niki FALCON, FNP.  Called Nurse Triage reporting Arm Pain.  Symptoms began yesterday.  Interventions attempted: OTC medications: tylenol .  Symptoms are: gradually worsening.  Triage Disposition: See PCP When Office is Open (Within 3 Days)  Patient/caregiver understands and will follow disposition?: Yes     Copied from CRM #[id number]. Topic: Clinical - Red Word Triage >> Sep 13, 2024  8:05 AM Mia F wrote: Red Word that prompted transfer to Nurse Triage: Pain in the elbow. Arm is tight and swelling in the hand. Has been going on since yesterday morning. Saw Dr Purcell a week ago but was for gout in the foot. Was given prednisone  for the foot and the issue with the foot has resolved but now it is the elbow. Reason for Disposition  [1] MODERATE pain (e.g., interferes with normal activities) AND [2] present > 3 days  Answer Assessment - Initial Assessment Questions 1. ONSET: When did the pain start?     yesterday 2. LOCATION: Where is the pain located?     Right elbow 3. PAIN: How bad is the pain? (Scale 0-10; or none, mild, moderate, severe)     8/10 4. WORK OR EXERCISE: Has there been any recent work or exercise that involved this part of the body?     no 5. CAUSE: What do you think is causing the arm pain?     gout 6. OTHER SYMPTOMS: Do you have any other symptoms? (e.g., neck pain, swelling, rash, fever, numbness, weakness) Right  elbow swelling down into hand 7. PREGNANCY: Is there any chance you are pregnant? When was your last menstrual period?     Na  Pt has a history of gout: he thinks this is the gout  Protocols used: Arm Pain-A-AH

## 2024-09-14 ENCOUNTER — Encounter: Payer: Self-pay | Admitting: Internal Medicine

## 2024-09-14 ENCOUNTER — Ambulatory Visit: Admitting: Internal Medicine

## 2024-09-14 VITALS — BP 140/92 | HR 97 | Temp 98.5°F | Ht 74.0 in | Wt 208.0 lb

## 2024-09-14 DIAGNOSIS — M109 Gout, unspecified: Secondary | ICD-10-CM

## 2024-09-14 DIAGNOSIS — I4821 Permanent atrial fibrillation: Secondary | ICD-10-CM | POA: Diagnosis not present

## 2024-09-14 DIAGNOSIS — I10 Essential (primary) hypertension: Secondary | ICD-10-CM

## 2024-09-14 MED ORDER — METHYLPREDNISOLONE ACETATE 80 MG/ML IJ SUSP
80.0000 mg | Freq: Once | INTRAMUSCULAR | Status: AC
  Administered 2024-09-14: 80 mg via INTRAMUSCULAR

## 2024-09-14 MED ORDER — COLCHICINE 0.6 MG PO TABS: 0.6000 mg | ORAL_TABLET | Freq: Every day | ORAL | 3 refills | Status: AC

## 2024-09-14 MED ORDER — TRAMADOL HCL 50 MG PO TABS: 50.0000 mg | ORAL_TABLET | Freq: Four times a day (QID) | ORAL | 0 refills | Status: AC | PRN

## 2024-09-14 MED ORDER — PREDNISONE 10 MG PO TABS: ORAL_TABLET | ORAL | 0 refills | Status: AC

## 2024-09-14 NOTE — Assessment & Plan Note (Signed)
 Stable rate and volume, continue current med tx, for cardiology f/u as planned

## 2024-09-14 NOTE — Progress Notes (Signed)
 Patient ID: LOC FEINSTEIN, male   DOB: 17-Oct-1961, 63 y.o.   MRN: [id number]        Chief Complaint: follow up right elbow pain x 3 days       HPI:  Isaiah [name] is a 63 y.o. male here with c/o above, now quite swollen, painful with reduced ROM, similar to prior episode of acute gouty arthritis.  Ran out of colchicine .  Pt denies chest pain, increased sob or doe, wheezing, orthopnea, PND, increased LE swelling, palpitations, dizziness or syncope.   Pt denies polydipsia, polyuria, or new focal neuro s/s.    Pt denies fever, wt loss, night sweats, loss of appetite, or other constitutional symptoms         Wt Readings from Last 3 Encounters:  09/14/24 208 lb (94.3 kg)  09/03/24 210 lb (95.3 kg)  03/05/24 204 lb (92.5 kg)   BP Readings from Last 3 Encounters:  09/14/24 (!) 140/92  09/03/24 (!) 144/90  03/05/24 132/80         Past Medical History:  Diagnosis Date   Atrial fibrillation, permanent (HCC) 01/24/2013   chronic,previuosly refuses coumadin,now on xarelto    CKD (chronic kidney disease) stage 3, GFR 30-59 ml/min (HCC) 01/24/2013   Baseline creatinine roughly 1.7-1.8.   Erectile dysfunction    Extremity ischemia, critical bil lower ext. 01/24/2013   Status post bilateral common femoral, profunda femoral and superficial femoral arterial embolectomy   Gout    H/O right and left cardiac catheterization November 2009   30-40% RCA disease, cardiac output Fick 6.3, thermodilution 5.3.  Normal artery pressures.   Heart murmur    History of Non-ischemic cardiomyopathy 2009 - 2014   EF previously as low as 35-40% in 2009, up to 40 and 45% by 2012.  Repeat echo January 2014: Echo EF 55-60% mild LVH, mild to moderate left atrial enlargement, mild right atrial enlargement.   History of TIA (transient ischemic attack) 2005   Carotid Dopplers January 2014 negative for significant stenosis.   Hypertension    Hypertensive heart disease    OSA on CPAP    use DME- Choice titration study was on  02/21/13   Pinched nerve in neck    Terminal aortic occlusion (HCC) 01/24/2013   Status post embolectomy   Past Surgical History:  Procedure Laterality Date   CARDIAC CATHETERIZATION  Nov 2009   right and left  heart cath ,cardiac output 6.3 by FICK AND 5.34 by thermal  diluation.norm RV pressures and nonobstructive cor disease. no shunt.some intramyocardial bridgingof the LAD   CAROTID DOPPLERS  Nov 2009   done for TIA which were normal   DOPPLER ECHOCARDIOGRAPHY  02/2011; 12/2012   a) EF40-45%; LA mod to severe dilated ;; b) EF 55-60%, mild LVH, Mild-Mod LA dilation   EMBOLECTOMY  01/23/2013   Procedure: EMBOLECTOMY;  Surgeon: Gaile LELON New, MD;  Location: [facility] OR;  Service: Vascular;  Laterality: Bilateral;  Bilateral femoral Embolectomy, Bilateral Iliac Embolectomy.   KNEE SURGERY      reports that he has never smoked. He has never used smokeless tobacco. He reports that he does not drink alcohol and does not use drugs. family history includes Hypertension in his brother, father, mother, sister, and sister; Stroke in his father and mother. Allergies  Allergen Reactions   Codeine Itching   Ace Inhibitors Swelling    Swelling of legs   Current Outpatient Medications on File Prior to Visit  Medication Sig Dispense Refill   allopurinol  (ZYLOPRIM )  100 MG tablet Take 0.5 tablets (50 mg total) by mouth 2 (two) times a week. 4 tablet 2   Nebivolol  HCl 20 MG TABS TAKE 1 TABLET BY MOUTH EVERY DAY FOR BLOOD PRESSURE 90 tablet 3   XARELTO  20 MG TABS tablet TAKE 1 TABLET BY MOUTH DAILY WITH SUPPER. 90 tablet 3   empagliflozin  (JARDIANCE ) 10 MG TABS tablet Take 1 tablet (10 mg total) by mouth daily before breakfast. (Patient not taking: Reported on 09/14/2024) 90 tablet 3   gabapentin  (NEURONTIN ) 300 MG capsule Take 1 capsule (300 mg total) by mouth at bedtime. (Patient not taking: Reported on 09/14/2024) 90 capsule 3   gabapentin  (NEURONTIN ) 300 MG capsule Take 1 capsule (300 mg total) by mouth 2  (two) times daily. (Patient not taking: Reported on 09/14/2024) 90 capsule 3   rosuvastatin  (CRESTOR ) 10 MG tablet Take 1 tablet (10 mg total) by mouth daily. (Patient not taking: Reported on 09/14/2024) 90 tablet 3   No current facility-administered medications on file prior to visit.        ROS:  All others reviewed and negative.  Objective        PE:  BP (!) 140/92   Pulse 97   Temp 98.5 F (36.9 C)   Ht 6' 2 (1.88 m)   Wt 208 lb (94.3 kg)   SpO2 99%   BMI 26.71 kg/m                 Constitutional: Pt appears in NAD               HENT: Head: NCAT.                Right Ear: External ear normal.                 Left Ear: External ear normal.                Eyes: . Pupils are equal, round, and reactive to light. Conjunctivae and EOM are normal               Nose: without d/c or deformity               Neck: Neck supple. Gross normal ROM               Cardiovascular: Normal rate and regular rhythm.                 Pulmonary/Chest: Effort normal and breath sounds without rales or wheezing.               Right elbow with diffuse 2-3+ tender, swelling redness and reduced ROM               Neurological: Pt is alert. At baseline orientation, motor grossly intact               Skin: LE edema - none,                Psychiatric: Pt behavior is normal without agitation   Micro: none  Cardiac tracings I have personally interpreted today:  none  Pertinent Radiological findings (summarize): none   Lab Results  Component Value Date   WBC 5.7 05/31/2023   HGB 13.2 05/31/2023   HCT 41.4 05/31/2023   PLT 148.0 (L) 05/31/2023   GLUCOSE 98 09/03/2024   CHOL 165 05/31/2023   TRIG 85.0 05/31/2023   HDL 43.70 05/31/2023   LDLCALC 104 (H) 05/31/2023   ALT  21 05/31/2023   AST 19 05/31/2023   NA 136 09/03/2024   K 4.2 09/03/2024   CL 105 09/03/2024   CREATININE 4.07 (H) 09/03/2024   BUN 31 (H) 09/03/2024   CO2 24 09/03/2024   TSH 2.197 01/25/2013   HGBA1C 5.8 (H) 05/31/2023    Assessment/Plan:  Isaiah [name] is a 63 y.o. Black or African American [2] male or African American [2] male with  has a past medical history of Atrial fibrillation, permanent (HCC) (01/24/2013), CKD (chronic kidney disease) stage 3, GFR 30-59 ml/min (HCC) (01/24/2013), Erectile dysfunction, Extremity ischemia, critical bil lower ext. (01/24/2013), Gout, H/O right and left cardiac catheterization (November 2009), Heart murmur, History of Non-ischemic cardiomyopathy (2009 - 2014), History of TIA (transient ischemic attack) (2005), Hypertension, Hypertensive heart disease, OSA on CPAP, Pinched nerve in neck, and Terminal aortic occlusion (HCC) (01/24/2013).  Acute gouty arthritis Mod to severe right elbow flare - for depomedrol 80 mg IM, prednisone  taper, tramadol  50 qid prn, and restart colchicine  at 0.6 mg every day preventive  Essential hypertension BP Readings from Last 3 Encounters:  09/14/24 (!) 140/92  09/03/24 (!) 144/90  03/05/24 132/80   Uncontrolled, likely reactive, pt to continue medical treatment bystolic  20 qd   Atrial fibrillation, permanent (HCC) Stable rate and volume, continue current med tx, for cardiology f/u as planned  Followup: Return if symptoms worsen or fail to improve.  Lynwood Rush, MD 09/14/2024 8:09 PM [facility]

## 2024-09-14 NOTE — Assessment & Plan Note (Signed)
 BP Readings from Last 3 Encounters:  09/14/24 (!) 140/92  09/03/24 (!) 144/90  03/05/24 132/80   Uncontrolled, likely reactive, pt to continue medical treatment bystolic  20 qd

## 2024-09-14 NOTE — Patient Instructions (Signed)
 You had the steroid shot today  Please take all new medication as prescribed - the tramadol  for pain, and prednisone   Please continue all other medications as before, and refills have been done if requested - the colchicine   Please have the pharmacy call with any other refills you may need.  Please keep your appointments with your specialists as you may have planned

## 2024-09-14 NOTE — Assessment & Plan Note (Signed)
 Mod to severe right elbow flare - for depomedrol 80 mg IM, prednisone  taper, tramadol  50 qid prn, and restart colchicine  at 0.6 mg every day preventive

## 2024-09-20 ENCOUNTER — Telehealth: Payer: Self-pay

## 2024-09-20 NOTE — Telephone Encounter (Signed)
 Copied from CRM #[id number]. Topic: General - Other >> Sep 20, 2024  8:28 AM Rosina BIRCH wrote: Reason for CRM: patient called wanting an update on FMLA paperwork that he dropped off on 9/19 [phone]

## 2024-09-21 NOTE — Telephone Encounter (Signed)
 Spoke with patient and informed him forms will be signed on Monday as provider is not in office Friday.

## 2024-09-26 ENCOUNTER — Ambulatory Visit: Admitting: Emergency Medicine

## 2024-09-26 ENCOUNTER — Encounter: Payer: Self-pay | Admitting: Emergency Medicine

## 2024-09-26 VITALS — BP 138/92 | Temp 98.7°F | Ht 74.0 in | Wt 205.0 lb

## 2024-09-26 DIAGNOSIS — M25521 Pain in right elbow: Secondary | ICD-10-CM | POA: Diagnosis not present

## 2024-09-26 DIAGNOSIS — M109 Gout, unspecified: Secondary | ICD-10-CM | POA: Diagnosis not present

## 2024-09-26 MED ORDER — MELOXICAM 15 MG PO TABS: 15.0000 mg | ORAL_TABLET | Freq: Every day | ORAL | 0 refills | Status: AC

## 2024-09-26 NOTE — Progress Notes (Signed)
 Isaiah [name] 63 y.o.   Chief Complaint  Patient presents with   Medical Management of Chronic Issues    Gout flare up on the right elbow area, need FMLA for when there is flare up, unable to lift 12 pound which is affecting his work.    HISTORY OF PRESENT ILLNESS: This is a 63 y.o. male here for follow-up of office visit on 09/14/2024 when he was seen by Dr. Norleen: Acute gouty arthritis Mod to severe right elbow flare - for depomedrol 80 mg IM, prednisone  taper, tramadol  50 qid prn, and restart colchicine  at 0.6 mg every day preventive  Was able to follow-up with orthopedist 1 week ago.  States they injected his right elbow.  Still no improvement.  HPI   Prior to Admission medications   Medication Sig Start Date End Date Taking? Authorizing Provider  Nebivolol  HCl 20 MG TABS TAKE 1 TABLET BY MOUTH EVERY DAY FOR BLOOD PRESSURE 06/17/24  Yes Tenzin Pavon, Emil Schanz, MD  XARELTO  20 MG TABS tablet TAKE 1 TABLET BY MOUTH DAILY WITH SUPPER. 07/23/24  Yes Advay Volante, Emil Schanz, MD  allopurinol  (ZYLOPRIM ) 100 MG tablet Take 0.5 tablets (50 mg total) by mouth 2 (two) times a week. Patient not taking: Reported on 09/26/2024 09/03/24   Merlynn Niki FALCON, FNP  colchicine  0.6 MG tablet Take 1 tablet (0.6 mg total) by mouth daily. Patient not taking: Reported on 09/26/2024 09/14/24   Norleen Lynwood ORN, MD  empagliflozin  (JARDIANCE ) 10 MG TABS tablet Take 1 tablet (10 mg total) by mouth daily before breakfast. Patient not taking: Reported on 09/26/2024 03/05/24   Keiasha Diep Jose, MD  gabapentin  (NEURONTIN ) 300 MG capsule Take 1 capsule (300 mg total) by mouth at bedtime. Patient not taking: Reported on 09/26/2024 05/31/23   Jaida Basurto Jose, MD  gabapentin  (NEURONTIN ) 300 MG capsule Take 1 capsule (300 mg total) by mouth 2 (two) times daily. Patient not taking: Reported on 09/26/2024 06/09/23   Standiford, Marsa FALCON, DPM  predniSONE  (DELTASONE ) 10 MG tablet Take 4 tabs by mouth x 3 days, 3 tabs x 3 days,  2 tabs x 3 days, 1 tab x 3 days Patient not taking: Reported on 09/26/2024 09/14/24   Norleen Lynwood ORN, MD  rosuvastatin  (CRESTOR ) 10 MG tablet Take 1 tablet (10 mg total) by mouth daily. Patient not taking: Reported on 09/26/2024 05/31/23   Purcell Emil Schanz, MD  traMADol  (ULTRAM ) 50 MG tablet Take 1 tablet (50 mg total) by mouth every 6 (six) hours as needed. Patient not taking: Reported on 09/26/2024 09/14/24   Norleen Lynwood ORN, MD    Allergies  Allergen Reactions   Codeine Itching   Ace Inhibitors Swelling    Swelling of legs    Patient Active Problem List   Diagnosis Date Noted   Right foot pain 10/27/2023   Nonischemic cardiomyopathy (HCC) 05/31/2023   Neuropathy of both feet 05/31/2023   History of gout 05/31/2023   Current use of long term anticoagulation 07/28/2021   CKD (chronic kidney disease) stage 4, GFR 15-29 ml/min (HCC) 12/05/2020   Hypertensive heart disease    Dyslipidemia 02/02/2015   Obesity (BMI 30-39.9) 09/10/2013   Acute gouty arthritis 03/09/2013   Atrial fibrillation, permanent (HCC) 01/24/2013   Essential hypertension 01/24/2013   Erectile dysfunction 01/24/2013   Atherosclerotic cardiovascular disease 01/24/2013    Past Medical History:  Diagnosis Date   Atrial fibrillation, permanent (HCC) 01/24/2013   chronic,previuosly refuses coumadin,now on xarelto    CKD (chronic kidney disease)  stage 3, GFR 30-59 ml/min (HCC) 01/24/2013   Baseline creatinine roughly 1.7-1.8.   Erectile dysfunction    Extremity ischemia, critical bil lower ext. 01/24/2013   Status post bilateral common femoral, profunda femoral and superficial femoral arterial embolectomy   Gout    H/O right and left cardiac catheterization November 2009   30-40% RCA disease, cardiac output Fick 6.3, thermodilution 5.3.  Normal artery pressures.   Heart murmur    History of Non-ischemic cardiomyopathy 2009 - 2014   EF previously as low as 35-40% in 2009, up to 40 and 45% by 2012.  Repeat echo January  2014: Echo EF 55-60% mild LVH, mild to moderate left atrial enlargement, mild right atrial enlargement.   History of TIA (transient ischemic attack) 2005   Carotid Dopplers January 2014 negative for significant stenosis.   Hypertension    Hypertensive heart disease    OSA on CPAP    use DME- Choice titration study was on 02/21/13   Pinched nerve in neck    Terminal aortic occlusion (HCC) 01/24/2013   Status post embolectomy    Past Surgical History:  Procedure Laterality Date   CARDIAC CATHETERIZATION  Nov 2009   right and left  heart cath ,cardiac output 6.3 by FICK AND 5.34 by thermal  diluation.norm RV pressures and nonobstructive cor disease. no shunt.some intramyocardial bridgingof the LAD   CAROTID DOPPLERS  Nov 2009   done for TIA which were normal   DOPPLER ECHOCARDIOGRAPHY  02/2011; 12/2012   a) EF40-45%; LA mod to severe dilated ;; b) EF 55-60%, mild LVH, Mild-Mod LA dilation   EMBOLECTOMY  01/23/2013   Procedure: EMBOLECTOMY;  Surgeon: Gaile LELON New, MD;  Location: [facility] OR;  Service: Vascular;  Laterality: Bilateral;  Bilateral femoral Embolectomy, Bilateral Iliac Embolectomy.   KNEE SURGERY      Social History   Socioeconomic History   Marital status: Divorced    Spouse name: Not on file   Number of children: Not on file   Years of education: Not on file   Highest education level: Not on file  Occupational History   Not on file  Tobacco Use   Smoking status: Never   Smokeless tobacco: Never  Substance and Sexual Activity   Alcohol use: No    Alcohol/week: 0.0 standard drinks of alcohol   Drug use: No   Sexual activity: Yes    Comment: married  Other Topics Concern   Not on file  Social History Narrative   Not on file   Social Drivers of Health   Financial Resource Strain: Not on file  Food Insecurity: Not on file  Transportation Needs: Not on file  Physical Activity: Not on file  Stress: Not on file  Social Connections: Not on file  Intimate Partner  Violence: Not on file    Family History  Problem Relation Age of Onset   Hypertension Mother    Stroke Mother    Hypertension Father    Stroke Father    Hypertension Sister    Hypertension Brother    Hypertension Sister      Review of Systems  Constitutional: Negative.  Negative for chills and fever.  HENT: Negative.  Negative for congestion and sore throat.   Respiratory: Negative.  Negative for cough and shortness of breath.   Cardiovascular: Negative.  Negative for chest pain and palpitations.  Gastrointestinal:  Negative for abdominal pain, diarrhea, nausea and vomiting.  Genitourinary: Negative.  Negative for dysuria and hematuria.  Musculoskeletal:  Positive for joint pain.  Skin: Negative.  Negative for rash.  Neurological: Negative.  Negative for dizziness and headaches.  All other systems reviewed and are negative.   Vitals:   09/26/24 0952  BP: (!) 138/92  Temp: 98.7 F (37.1 C)    Physical Exam Vitals reviewed.  Constitutional:      Appearance: Normal appearance.  HENT:     Head: Normocephalic.  Eyes:     Extraocular Movements: Extraocular movements intact.  Cardiovascular:     Rate and Rhythm: Normal rate.  Pulmonary:     Effort: Pulmonary effort is normal.  Musculoskeletal:     Comments: Right elbow: Positive swelling and tenderness with decreased range of motion due to pain Right upper extremity: Neurovascularly intact  Skin:    General: Skin is warm and dry.  Neurological:     Mental Status: He is alert and oriented to person, place, and time.  Psychiatric:        Mood and Affect: Mood normal.        Behavior: Behavior normal.      ASSESSMENT & PLAN: Problem List Items Addressed This Visit       Musculoskeletal and Integument   Acute gouty arthritis   Persistent flareup Continue colchicine  0.6 mg daily Recommend to start meloxicam 50 mg daily No longer taking steroids Need follow-up with orthopedist Sports medicine referral placed  today      Relevant Medications   meloxicam (MOBIC) 15 MG tablet   Other Relevant Orders   Ambulatory referral to Sports Medicine     Other   Right elbow pain - Primary   Still active and affecting quality of life. Limiting working duties Pain management discussed Continue tramadol  for moderate to severe pain as needed Needs orthopedic reevaluation Differential diagnosis discussed.  Most likely secondary to gout No injuries.  Doubt tear May need MRI      Relevant Medications   meloxicam (MOBIC) 15 MG tablet   Other Relevant Orders   Ambulatory referral to Sports Medicine   Patient Instructions  Gout  Gout is painful swelling of your joints. Gout is a type of arthritis. It is caused by having too much uric acid in your body. Uric acid is a chemical that is made when your body breaks down substances called purines. If your body has too much uric acid, sharp crystals can form and build up in your joints. This causes pain and swelling. Gout attacks can happen quickly and be very painful (acute gout). Over time, the attacks can affect more joints and happen more often (chronic gout). What are the causes? Gout is caused by too much uric acid in your blood. This can happen because: Your kidneys do not remove enough uric acid from your blood. Your body makes too much uric acid. You eat too many foods that are high in purines. These foods include organ meats, some seafood, and beer. Trauma or stress can bring on an attack. What increases the risk? Having a family history of gout. Being male and middle-aged. Being male and having gone through menopause. Having an organ transplant. Taking certain medicines. Having certain conditions, such as: Being very overweight (obese). Lead poisoning. Kidney disease. A skin condition called psoriasis. Other risks include: Losing weight too quickly. Not having enough water in the body (being dehydrated). Drinking alcohol, especially  beer. Drinking beverages that are sweetened with a type of sugar called fructose. What are the signs or symptoms? An attack of acute gout often starts  at night and usually happens in just one joint. The most common place is the big toe. Other joints that may be affected include joints of the feet, ankle, knee, fingers, wrist, or elbow. Symptoms may include: Very bad pain. Warmth. Swelling. Stiffness. Tenderness. The affected joint may be very painful to touch. Shiny, red, or purple skin. Chills and fever. Chronic gout may cause symptoms more often. More joints may be involved. You may also have white or yellow lumps (tophi) on your hands or feet or in other areas near your joints. How is this treated? Treatment for an acute attack may include medicines for pain and swelling, such as: NSAIDs, such as ibuprofen. Steroids taken by mouth or injected into a joint. Colchicine . This can be given by mouth or through an IV tube. Treatment to prevent future attacks may include: Taking small doses of NSAIDs or colchicine  daily. Using a medicine that reduces uric acid levels in your blood, such as allopurinol . Making changes to your diet. You may need to see a food expert (dietitian) about what to eat and drink to prevent gout. Follow these instructions at home: During a gout attack  If told, put ice on the painful area. To do this: Put ice in a plastic bag. Place a towel between your skin and the bag. Leave the ice on for 20 minutes, 2-3 times a day. Take off the ice if your skin turns bright red. This is very important. If you cannot feel pain, heat, or cold, you have a greater risk of damage to the area. Raise the painful joint above the level of your heart as often as you can. Rest the joint as much as possible. If the joint is in your leg, you may be given crutches. Follow instructions from your doctor about what you cannot eat or drink. Avoiding future gout attacks Eat a low-purine diet.  Avoid foods and drinks such as: Liver. Kidney. Anchovies. Asparagus. Herring. Mushrooms. Mussels. Beer. Stay at a healthy weight. If you want to lose weight, talk with your doctor. Do not lose weight too fast. Start or continue an exercise plan as told by your doctor. Eating and drinking Avoid drinks sweetened by fructose. Drink enough fluids to keep your pee (urine) pale yellow. If you drink alcohol: Limit how much you have to: 0-1 drink a day for women who are not pregnant. 0-2 drinks a day for men. Know how much alcohol is in a drink. In the [place] one drink equals one 12 oz bottle of beer (355 mL), one 5 oz glass of wine (148 mL), or one 1 oz glass of hard liquor (44 mL). General instructions Take over-the-counter and prescription medicines only as told by your doctor. Ask your doctor if you should avoid driving or using machines while you are taking your medicine. Return to your normal activities when your doctor says that it is safe. Keep all follow-up visits. Where to find more information [employer]: www.niams.http://www.myers.net/ Contact a doctor if: You have another gout attack. You still have symptoms of a gout attack after 10 days of treatment. You have problems (side effects) because of your medicines. You have chills or a fever. You have burning pain when you pee (urinate). You have pain in your lower back or belly. Get help right away if: You have very bad pain. Your pain cannot be controlled. You cannot pee. Summary Gout is painful swelling of the joints. The most common site of pain is the big toe,  but it can affect other joints. Medicines and avoiding some foods can help to prevent and treat gout attacks. This information is not intended to replace advice given to you by your health care provider. Make sure you discuss any questions you have with your health care provider. Document Revised: 09/16/2021 Document Reviewed: 09/16/2021 Elsevier Patient  Education  2024 Elsevier Inc.    Emil Schaumann, MD [facility]

## 2024-09-26 NOTE — Assessment & Plan Note (Signed)
 Still active and affecting quality of life. Limiting working duties Pain management discussed Continue tramadol  for moderate to severe pain as needed Needs orthopedic reevaluation Differential diagnosis discussed.  Most likely secondary to gout No injuries.  Doubt tear May need MRI

## 2024-09-26 NOTE — Patient Instructions (Signed)
 Gout  Gout is painful swelling of your joints. Gout is a type of arthritis. It is caused by having too much uric acid in your body. Uric acid is a chemical that is made when your body breaks down substances called purines. If your body has too much uric acid, sharp crystals can form and build up in your joints. This causes pain and swelling. Gout attacks can happen quickly and be very painful (acute gout). Over time, the attacks can affect more joints and happen more often (chronic gout). What are the causes? Gout is caused by too much uric acid in your blood. This can happen because: Your kidneys do not remove enough uric acid from your blood. Your body makes too much uric acid. You eat too many foods that are high in purines. These foods include organ meats, some seafood, and beer. Trauma or stress can bring on an attack. What increases the risk? Having a family history of gout. Being male and middle-aged. Being male and having gone through menopause. Having an organ transplant. Taking certain medicines. Having certain conditions, such as: Being very overweight (obese). Lead poisoning. Kidney disease. A skin condition called psoriasis. Other risks include: Losing weight too quickly. Not having enough water in the body (being dehydrated). Drinking alcohol, especially beer. Drinking beverages that are sweetened with a type of sugar called fructose. What are the signs or symptoms? An attack of acute gout often starts at night and usually happens in just one joint. The most common place is the big toe. Other joints that may be affected include joints of the feet, ankle, knee, fingers, wrist, or elbow. Symptoms may include: Very bad pain. Warmth. Swelling. Stiffness. Tenderness. The affected joint may be very painful to touch. Shiny, red, or purple skin. Chills and fever. Chronic gout may cause symptoms more often. More joints may be involved. You may also have white or yellow lumps  (tophi) on your hands or feet or in other areas near your joints. How is this treated? Treatment for an acute attack may include medicines for pain and swelling, such as: NSAIDs, such as ibuprofen. Steroids taken by mouth or injected into a joint. Colchicine. This can be given by mouth or through an IV tube. Treatment to prevent future attacks may include: Taking small doses of NSAIDs or colchicine daily. Using a medicine that reduces uric acid levels in your blood, such as allopurinol. Making changes to your diet. You may need to see a food expert (dietitian) about what to eat and drink to prevent gout. Follow these instructions at home: During a gout attack  If told, put ice on the painful area. To do this: Put ice in a plastic bag. Place a towel between your skin and the bag. Leave the ice on for 20 minutes, 2-3 times a day. Take off the ice if your skin turns bright red. This is very important. If you cannot feel pain, heat, or cold, you have a greater risk of damage to the area. Raise the painful joint above the level of your heart as often as you can. Rest the joint as much as possible. If the joint is in your leg, you may be given crutches. Follow instructions from your doctor about what you cannot eat or drink. Avoiding future gout attacks Eat a low-purine diet. Avoid foods and drinks such as: Liver. Kidney. Anchovies. Asparagus. Herring. Mushrooms. Mussels. Beer. Stay at a healthy weight. If you want to lose weight, talk with your doctor. Do not  lose weight too fast. Start or continue an exercise plan as told by your doctor. Eating and drinking Avoid drinks sweetened by fructose. Drink enough fluids to keep your pee (urine) pale yellow. If you drink alcohol: Limit how much you have to: 0-1 drink a day for women who are not pregnant. 0-2 drinks a day for men. Know how much alcohol is in a drink. In the [place] one drink equals one 12 oz bottle of beer (355 mL), one 5 oz  glass of wine (148 mL), or one 1 oz glass of hard liquor (44 mL). General instructions Take over-the-counter and prescription medicines only as told by your doctor. Ask your doctor if you should avoid driving or using machines while you are taking your medicine. Return to your normal activities when your doctor says that it is safe. Keep all follow-up visits. Where to find more information [employer]: www.niams.http://www.myers.net/ Contact a doctor if: You have another gout attack. You still have symptoms of a gout attack after 10 days of treatment. You have problems (side effects) because of your medicines. You have chills or a fever. You have burning pain when you pee (urinate). You have pain in your lower back or belly. Get help right away if: You have very bad pain. Your pain cannot be controlled. You cannot pee. Summary Gout is painful swelling of the joints. The most common site of pain is the big toe, but it can affect other joints. Medicines and avoiding some foods can help to prevent and treat gout attacks. This information is not intended to replace advice given to you by your health care provider. Make sure you discuss any questions you have with your health care provider. Document Revised: 09/16/2021 Document Reviewed: 09/16/2021 Elsevier Patient Education  2024 [employer].

## 2024-09-26 NOTE — Assessment & Plan Note (Signed)
 Persistent flareup Continue colchicine  0.6 mg daily Recommend to start meloxicam 50 mg daily No longer taking steroids Need follow-up with orthopedist Sports medicine referral placed today

## 2024-09-27 ENCOUNTER — Telehealth: Payer: Self-pay | Admitting: Sports Medicine

## 2024-09-27 ENCOUNTER — Ambulatory Visit: Admitting: Sports Medicine

## 2024-09-27 VITALS — HR 92 | Ht 74.0 in | Wt 205.0 lb

## 2024-09-27 DIAGNOSIS — N184 Chronic kidney disease, stage 4 (severe): Secondary | ICD-10-CM

## 2024-09-27 DIAGNOSIS — M109 Gout, unspecified: Secondary | ICD-10-CM | POA: Diagnosis not present

## 2024-09-27 DIAGNOSIS — M25521 Pain in right elbow: Secondary | ICD-10-CM

## 2024-09-27 NOTE — Patient Instructions (Addendum)
 Thank you for coming in today  - Recommend reestablishing with nephrology to discuss worsening kidney function with GFR 15 on 09/03/2024 - Restart allopurinol  50 mg twice weekly due to GFR - Recommend colchicine  1.2 mg for 3 days, then return to colchicine  0.6 mg daily for 1 week  - Recommend no lifting >5 pounds with right upper extremity for 2 weeks or until reevaluated.  If this accommodation cannot be provided, then patient should be out of work for 2 weeks until reevaluated.  Work note provided  Follow-up in 10 to 14 days

## 2024-09-27 NOTE — Progress Notes (Signed)
 Ben Hillery Bhalla D.CLEMENTEEN AMYE [facility] [address] [place] [address] Phone: [phone]   Assessment and Plan:     1. Acute gouty arthritis (Primary) 2. Right elbow pain 3. CKD (chronic kidney disease) stage 4, GFR 15-29 ml/min (HCC) -Chronic with exacerbation, initial sports medicine visit - Most consistent with gout flare of right elbow likely due to worsening kidney function - Recommend reestablishing with nephrology to discuss worsening kidney function with GFR 15 on 09/03/2024 - Restart allopurinol  50 mg twice weekly due to GFR - Recommend colchicine  1.2 mg for 3 days, then return to colchicine  0.6 mg daily for 1 week - Patient has already used prednisone  course, IM methylprednisone injection, intra-articular elbow CSI over the past 2 to 3 weeks without significant improvement - Patient states he had x-ray at [facility].  I do not have access to these reports, but patient states no acute fracture with mild bone spur.  Recommend signing records release form - Recommend no lifting >5 pounds with right upper extremity for 2 weeks or until reevaluated.  If this accommodation cannot be provided, then patient should be out of work for 2 weeks until reevaluated.  Work note provided    Pertinent previous records reviewed include lab work 08/30/24, internal medicine note 09/26/2024, internal medicine note 09/14/2024   Follow Up: 10 to 14 days for reevaluation.  Could consider aspiration/CSI   Subjective:   I, Isaiah [name], am serving as a Neurosurgeon for Doctor Morene Mace  Chief Complaint: right elbow pain   HPI:   09/27/24 Patient is a 63 year old male with right elbow pain. Patient states pain started 09/14/2024. No MOI. Has tried prednisone  and that didn't help. Was seen by PCP was given a shot that did not help.  Was seen at [facility] given a shot did not help. Meloxicam has helped a little but not enough. Has some tingling in his fingers.  Decreased ROM . No radiating pain. Decreased in grip strength. Colchicine  did not help with the pain either.   Relevant Historical Information: Atrial fibrillation on chronic anticoagulation with Xarelto , CKD stage IV, hypertension, gout  Additional pertinent review of systems negative.   Current Outpatient Medications:    allopurinol  (ZYLOPRIM ) 100 MG tablet, Take 0.5 tablets (50 mg total) by mouth 2 (two) times a week. (Patient not taking: Reported on 09/26/2024), Disp: 4 tablet, Rfl: 2   colchicine  0.6 MG tablet, Take 1 tablet (0.6 mg total) by mouth daily. (Patient not taking: Reported on 09/26/2024), Disp: 90 tablet, Rfl: 3   empagliflozin  (JARDIANCE ) 10 MG TABS tablet, Take 1 tablet (10 mg total) by mouth daily before breakfast. (Patient not taking: Reported on 09/26/2024), Disp: 90 tablet, Rfl: 3   gabapentin  (NEURONTIN ) 300 MG capsule, Take 1 capsule (300 mg total) by mouth at bedtime. (Patient not taking: Reported on 09/26/2024), Disp: 90 capsule, Rfl: 3   gabapentin  (NEURONTIN ) 300 MG capsule, Take 1 capsule (300 mg total) by mouth 2 (two) times daily. (Patient not taking: Reported on 09/26/2024), Disp: 90 capsule, Rfl: 3   meloxicam (MOBIC) 15 MG tablet, Take 1 tablet (15 mg total) by mouth daily for 10 days., Disp: 10 tablet, Rfl: 0   Nebivolol  HCl 20 MG TABS, TAKE 1 TABLET BY MOUTH EVERY DAY FOR BLOOD PRESSURE, Disp: 90 tablet, Rfl: 3   predniSONE  (DELTASONE ) 10 MG tablet, Take 4 tabs by mouth x 3 days, 3 tabs x 3 days, 2 tabs x 3 days, 1 tab x  3 days (Patient not taking: Reported on 09/26/2024), Disp: 30 tablet, Rfl: 0   rosuvastatin  (CRESTOR ) 10 MG tablet, Take 1 tablet (10 mg total) by mouth daily. (Patient not taking: Reported on 09/26/2024), Disp: 90 tablet, Rfl: 3   traMADol  (ULTRAM ) 50 MG tablet, Take 1 tablet (50 mg total) by mouth every 6 (six) hours as needed. (Patient not taking: Reported on 09/26/2024), Disp: 30 tablet, Rfl: 0   XARELTO  20 MG TABS tablet, TAKE 1 TABLET BY MOUTH  DAILY WITH SUPPER., Disp: 90 tablet, Rfl: 3   Objective:     Vitals:   09/27/24 1022  Pulse: 92  SpO2: 100%  Weight: 205 lb (93 kg)  Height: 6' 2 (1.88 m)      Body mass index is 26.32 kg/m.    Physical Exam:    General: Appears well, no acute distress, nontoxic and pleasant Neck: FROM, no pain Neuro: sensation is intact distally with no deficits, strenghth is 5/5 in elbow flexors/extenders/supinator/pronators and wrist flexors/extensors Psych: no evidence of anxiety or depression  Right elbow: No deformity, Mild to moderate swelling No muscle wasting Decreased carrying angle ROM:40-110, supination and pronation 70 NTTP over triceps, ticeps tendon, olecranon, lat epicondyle, medial epicondyle, antecubital fossa, biceps tendon, supinator, pronator   Electronically signed by:  Odis Mace D.CLEMENTEEN AM[facility] 10:48 AM 09/27/24

## 2024-09-27 NOTE — Telephone Encounter (Signed)
 Faxed completed Limited Wage Continuation Plan form to pt employer (per his request) at [phone]. Sent to scan.

## 2024-10-09 NOTE — Progress Notes (Unsigned)
 [name] [address] [place] [address] Phone: [phone]   Assessment and Plan:     1. Acute gouty arthritis (Primary) 2. Right elbow pain 3. CKD (chronic kidney disease) stage 4, GFR 15-29 ml/min (HCC) -Chronic with exacerbation, subsequent visit - Overall significant improvement in gout flare of right elbow since using colchicine  1.2 mg daily for 3 days, and then colchicine  0.6 mg for 1 week. - Recommend restarting allopurinol  50 mg twice daily due to GFR - May use colchicine  0.6 mg daily as needed for gout flare - Prior to our office visit on 09/27/2024, patient had used prednisone  course, methylprednisone injection, intra-articular elbow CSI for gout flare that were not significantly beneficial.  The patient has recurrent symptoms, colchicine  course appears to be the most effective in decreasing symptoms - Patient is cleared to restart work without restrictions tonight 10/11/2024.  FMLA paperwork completed in office - Based on worsening kidney function with GFR 15 on 09/03/2024, I recommend patient reestablish with nephrology    Pertinent previous records reviewed include none   Follow Up: As needed   Subjective:   I, Isaiah [name], am serving as a Neurosurgeon for Doctor Isaiah [name]   Chief Complaint: right elbow pain    HPI:    09/27/24 Patient is a 63 year old male with right elbow pain. Patient states pain started 09/14/2024. No MOI. Has tried prednisone  and that didn't help. Was seen by PCP was given a shot that did not help.  Was seen at [facility] given a shot did not help. Meloxicam has helped a little but not enough. Has some tingling in his fingers. Decreased ROM . No radiating pain. Decreased in grip strength. Colchicine  did not help with the pain either.   10/10/2024 Patient states he is much better. Still has decreased ROM    Relevant Historical Information: Atrial fibrillation on chronic  anticoagulation with Xarelto , CKD stage IV, hypertension, gout  Additional pertinent review of systems negative.   Current Outpatient Medications:    allopurinol  (ZYLOPRIM ) 100 MG tablet, Take 0.5 tablets (50 mg total) by mouth 2 (two) times a week. (Patient not taking: Reported on 09/26/2024), Disp: 4 tablet, Rfl: 2   colchicine  0.6 MG tablet, Take 1 tablet (0.6 mg total) by mouth daily. (Patient not taking: Reported on 09/26/2024), Disp: 90 tablet, Rfl: 3   empagliflozin  (JARDIANCE ) 10 MG TABS tablet, Take 1 tablet (10 mg total) by mouth daily before breakfast. (Patient not taking: Reported on 09/26/2024), Disp: 90 tablet, Rfl: 3   gabapentin  (NEURONTIN ) 300 MG capsule, Take 1 capsule (300 mg total) by mouth at bedtime. (Patient not taking: Reported on 09/26/2024), Disp: 90 capsule, Rfl: 3   gabapentin  (NEURONTIN ) 300 MG capsule, Take 1 capsule (300 mg total) by mouth 2 (two) times daily. (Patient not taking: Reported on 09/26/2024), Disp: 90 capsule, Rfl: 3   Nebivolol  HCl 20 MG TABS, TAKE 1 TABLET BY MOUTH EVERY DAY FOR BLOOD PRESSURE, Disp: 90 tablet, Rfl: 3   predniSONE  (DELTASONE ) 10 MG tablet, Take 4 tabs by mouth x 3 days, 3 tabs x 3 days, 2 tabs x 3 days, 1 tab x 3 days (Patient not taking: Reported on 09/26/2024), Disp: 30 tablet, Rfl: 0   rosuvastatin  (CRESTOR ) 10 MG tablet, Take 1 tablet (10 mg total) by mouth daily. (Patient not taking: Reported on 09/26/2024), Disp: 90 tablet, Rfl: 3   traMADol  (ULTRAM ) 50 MG tablet, Take 1 tablet (50 mg total) by  mouth every 6 (six) hours as needed. (Patient not taking: Reported on 09/26/2024), Disp: 30 tablet, Rfl: 0   XARELTO  20 MG TABS tablet, TAKE 1 TABLET BY MOUTH DAILY WITH SUPPER., Disp: 90 tablet, Rfl: 3   Objective:     Vitals:   10/10/24 0925  BP: 138/88  Pulse: 75  SpO2: 99%  Weight: 204 lb (92.5 kg)  Height: 6' 2 (1.88 m)      Body mass index is 26.19 kg/m.    Physical Exam:    General: Appears well, no acute distress, nontoxic  and pleasant Neck: FROM, no pain Neuro: sensation is intact distally with no deficits, strenghth is 5/5 in elbow flexors/extenders/supinator/pronators and wrist flexors/extensors Psych: no evidence of anxiety or depression  Right elbow: No deformity, swelling or muscle wasting Normal Carrying angle ROM:10-130, supination and pronation 80 NTTP over triceps, ticeps tendon, olecranon, lat epicondyle, medial epicondyle, antecubital fossa, biceps tendon, supinator, pronator    Electronically signed by:  [name] 9:56 AM 10/10/24

## 2024-10-10 ENCOUNTER — Ambulatory Visit: Admitting: Sports Medicine

## 2024-10-10 VITALS — BP 138/88 | HR 75 | Ht 74.0 in | Wt 204.0 lb

## 2024-10-10 DIAGNOSIS — M25521 Pain in right elbow: Secondary | ICD-10-CM

## 2024-10-10 DIAGNOSIS — N184 Chronic kidney disease, stage 4 (severe): Secondary | ICD-10-CM | POA: Diagnosis not present

## 2024-10-10 DIAGNOSIS — M109 Gout, unspecified: Secondary | ICD-10-CM

## 2024-10-10 NOTE — Patient Instructions (Addendum)
 Restart allopurinol  50 mg  2x weekly   Use colchicine  as needed for flare   Call and follow up with kidney Doctor   As needed follow up

## 2024-11-08 LAB — LAB REPORT - SCANNED: EGFR: 16

## 2025-02-01 ENCOUNTER — Other Ambulatory Visit: Payer: Self-pay | Admitting: Family Medicine

## 2025-02-01 DIAGNOSIS — M10371 Gout due to renal impairment, right ankle and foot: Secondary | ICD-10-CM
